# Patient Record
Sex: Female | Born: 1960
Health system: Southern US, Community
[De-identification: ages and names within clinical notes are randomized; demographics above are authoritative.]

## PROBLEM LIST (undated history)

## (undated) DIAGNOSIS — C50919 Malignant neoplasm of unspecified site of unspecified female breast: Secondary | ICD-10-CM

## (undated) DIAGNOSIS — L509 Urticaria, unspecified: Secondary | ICD-10-CM

## (undated) DIAGNOSIS — M899 Disorder of bone, unspecified: Secondary | ICD-10-CM

## (undated) DIAGNOSIS — Z853 Personal history of malignant neoplasm of breast: Secondary | ICD-10-CM

## (undated) DIAGNOSIS — M949 Disorder of cartilage, unspecified: Secondary | ICD-10-CM

## (undated) DIAGNOSIS — K219 Gastro-esophageal reflux disease without esophagitis: Secondary | ICD-10-CM

## (undated) DIAGNOSIS — E669 Obesity, unspecified: Secondary | ICD-10-CM

## (undated) DIAGNOSIS — I00 Rheumatic fever without heart involvement: Secondary | ICD-10-CM

## (undated) HISTORY — DX: Disorder of bone, unspecified: M89.9

## (undated) HISTORY — DX: Disorder of cartilage, unspecified: M94.9

## (undated) HISTORY — DX: Urticaria, unspecified: L50.9

## (undated) HISTORY — DX: Obesity, unspecified: E66.9

## (undated) HISTORY — DX: Malignant neoplasm of unspecified site of unspecified female breast: C50.919

## (undated) HISTORY — DX: Personal history of malignant neoplasm of breast: Z85.3

## (undated) HISTORY — DX: Rheumatic fever without heart involvement: I00

## (undated) HISTORY — DX: Gastro-esophageal reflux disease without esophagitis: K21.9

---

## 2003-08-22 ENCOUNTER — Encounter: Admission: RE | Admit: 2003-08-22 | Discharge: 2003-08-22 | Payer: Self-pay | Admitting: Family Medicine

## 2003-09-07 ENCOUNTER — Encounter: Admission: RE | Admit: 2003-09-07 | Discharge: 2003-09-07 | Payer: Self-pay | Admitting: Family Medicine

## 2003-09-14 ENCOUNTER — Ambulatory Visit (HOSPITAL_COMMUNITY): Admission: RE | Admit: 2003-09-14 | Discharge: 2003-09-14 | Payer: Self-pay | Admitting: Family Medicine

## 2003-12-07 ENCOUNTER — Other Ambulatory Visit: Admission: RE | Admit: 2003-12-07 | Discharge: 2003-12-07 | Payer: Self-pay | Admitting: Obstetrics and Gynecology

## 2003-12-18 DIAGNOSIS — D259 Leiomyoma of uterus, unspecified: Secondary | ICD-10-CM | POA: Insufficient documentation

## 2004-09-08 ENCOUNTER — Emergency Department (HOSPITAL_COMMUNITY): Admission: EM | Admit: 2004-09-08 | Discharge: 2004-09-08 | Payer: Self-pay | Admitting: Emergency Medicine

## 2004-09-11 ENCOUNTER — Encounter (INDEPENDENT_AMBULATORY_CARE_PROVIDER_SITE_OTHER): Payer: Self-pay | Admitting: *Deleted

## 2004-09-11 ENCOUNTER — Observation Stay (HOSPITAL_COMMUNITY): Admission: RE | Admit: 2004-09-11 | Discharge: 2004-09-12 | Payer: Self-pay | Admitting: General Surgery

## 2005-12-03 ENCOUNTER — Encounter: Admission: RE | Admit: 2005-12-03 | Discharge: 2005-12-03 | Payer: Self-pay | Admitting: Family Medicine

## 2006-01-02 ENCOUNTER — Other Ambulatory Visit: Admission: RE | Admit: 2006-01-02 | Discharge: 2006-01-02 | Payer: Self-pay | Admitting: Obstetrics and Gynecology

## 2006-01-09 ENCOUNTER — Ambulatory Visit (HOSPITAL_COMMUNITY): Admission: RE | Admit: 2006-01-09 | Discharge: 2006-01-09 | Payer: Self-pay | Admitting: Obstetrics and Gynecology

## 2006-01-23 ENCOUNTER — Encounter: Admission: RE | Admit: 2006-01-23 | Discharge: 2006-01-23 | Payer: Self-pay | Admitting: Obstetrics and Gynecology

## 2006-01-27 ENCOUNTER — Encounter: Admission: RE | Admit: 2006-01-27 | Discharge: 2006-01-27 | Payer: Self-pay | Admitting: Obstetrics and Gynecology

## 2006-01-27 ENCOUNTER — Encounter (INDEPENDENT_AMBULATORY_CARE_PROVIDER_SITE_OTHER): Payer: Self-pay | Admitting: *Deleted

## 2007-06-02 ENCOUNTER — Encounter: Admission: RE | Admit: 2007-06-02 | Discharge: 2007-06-02 | Payer: Self-pay | Admitting: Rheumatology

## 2007-07-08 ENCOUNTER — Ambulatory Visit: Payer: Self-pay | Admitting: Oncology

## 2007-08-08 ENCOUNTER — Emergency Department (HOSPITAL_COMMUNITY): Admission: EM | Admit: 2007-08-08 | Discharge: 2007-08-08 | Payer: Self-pay | Admitting: Family Medicine

## 2007-08-12 LAB — SEDIMENTATION RATE: Sed Rate: 31 mm/hr — ABNORMAL HIGH (ref 0–22)

## 2007-08-12 LAB — CBC & DIFF AND RETIC
BASO%: 0.5 % (ref 0.0–2.0)
Eosinophils Absolute: 0.1 10*3/uL (ref 0.0–0.5)
HCT: 28.8 % — ABNORMAL LOW (ref 34.8–46.6)
LYMPH%: 23.2 % (ref 14.0–48.0)
MCHC: 31.4 g/dL — ABNORMAL LOW (ref 32.0–36.0)
MCV: 65.1 fL — ABNORMAL LOW (ref 81.0–101.0)
MONO#: 0.6 10*3/uL (ref 0.1–0.9)
MONO%: 6.6 % (ref 0.0–13.0)
NEUT%: 68.7 % (ref 39.6–76.8)
Platelets: 386 10*3/uL (ref 145–400)
RBC: 4.43 10*6/uL (ref 3.70–5.32)
Retic %: 1.6 % (ref 0.4–2.3)
WBC: 8.6 10*3/uL (ref 3.9–10.0)

## 2007-08-12 LAB — COMPREHENSIVE METABOLIC PANEL
Albumin: 4.6 g/dL (ref 3.5–5.2)
BUN: 16 mg/dL (ref 6–23)
CO2: 22 mEq/L (ref 19–32)
Glucose, Bld: 100 mg/dL — ABNORMAL HIGH (ref 70–99)
Potassium: 4.1 mEq/L (ref 3.5–5.3)
Sodium: 137 mEq/L (ref 135–145)
Total Protein: 8.2 g/dL (ref 6.0–8.3)

## 2007-08-12 LAB — MORPHOLOGY

## 2007-08-12 LAB — LACTATE DEHYDROGENASE: LDH: 132 U/L (ref 94–250)

## 2007-08-14 LAB — FOLATE RBC: RBC Folate: 824 ng/mL — ABNORMAL HIGH (ref 180–600)

## 2007-08-14 LAB — FERRITIN: Ferritin: 3 ng/mL — ABNORMAL LOW (ref 10–291)

## 2007-08-14 LAB — HAPTOGLOBIN: Haptoglobin: 103 mg/dL (ref 16–200)

## 2007-08-14 LAB — IMMUNOFIXATION ELECTROPHORESIS: Total Protein, Serum Electrophoresis: 8.3 g/dL (ref 6.0–8.3)

## 2007-08-17 LAB — HEMOGLOBINOPATHY EVALUATION
Hgb A2 Quant: 2.3 % (ref 2.2–3.2)
Hgb F Quant: 0 % (ref 0.0–2.0)
Hgb S Quant: 0 % (ref 0.0–0.0)

## 2007-08-26 ENCOUNTER — Ambulatory Visit: Payer: Self-pay | Admitting: Oncology

## 2007-09-17 LAB — FERRITIN: Ferritin: 168 ng/mL (ref 10–291)

## 2007-09-17 LAB — CBC & DIFF AND RETIC
Basophils Absolute: 0 10*3/uL (ref 0.0–0.1)
Eosinophils Absolute: 0.1 10*3/uL (ref 0.0–0.5)
HCT: 34.8 % (ref 34.8–46.6)
HGB: 10.9 g/dL — ABNORMAL LOW (ref 11.6–15.9)
LYMPH%: 24.4 % (ref 14.0–48.0)
MCV: 70.4 fL — ABNORMAL LOW (ref 81.0–101.0)
MONO#: 0.6 10*3/uL (ref 0.1–0.9)
MONO%: 6.8 % (ref 0.0–13.0)
NEUT#: 5.5 10*3/uL (ref 1.5–6.5)
NEUT%: 66.7 % (ref 39.6–76.8)
Platelets: 358 10*3/uL (ref 145–400)
RBC: 4.94 10*6/uL (ref 3.70–5.32)
Retic %: 1.9 % (ref 0.4–2.3)
WBC: 8.2 10*3/uL (ref 3.9–10.0)

## 2007-10-01 LAB — CBC & DIFF AND RETIC
BASO%: 0.4 % (ref 0.0–2.0)
Eosinophils Absolute: 0.2 10*3/uL (ref 0.0–0.5)
MCHC: 33.3 g/dL (ref 32.0–36.0)
MONO#: 0.6 10*3/uL (ref 0.1–0.9)
NEUT#: 5.4 10*3/uL (ref 1.5–6.5)
RBC: 4.6 10*6/uL (ref 3.70–5.32)
RDW: 31.1 % — ABNORMAL HIGH (ref 11.3–14.5)
RETIC #: 69 10*3/uL (ref 19.7–115.1)
Retic %: 1.5 % (ref 0.4–2.3)
WBC: 7.9 10*3/uL (ref 3.9–10.0)
lymph#: 1.6 10*3/uL (ref 0.9–3.3)

## 2007-10-13 ENCOUNTER — Ambulatory Visit: Payer: Self-pay | Admitting: Oncology

## 2007-10-14 ENCOUNTER — Encounter: Admission: RE | Admit: 2007-10-14 | Discharge: 2007-10-14 | Payer: Self-pay | Admitting: Oncology

## 2007-10-15 LAB — CBC & DIFF AND RETIC
Basophils Absolute: 0 10*3/uL (ref 0.0–0.1)
EOS%: 1.4 % (ref 0.0–7.0)
Eosinophils Absolute: 0.1 10*3/uL (ref 0.0–0.5)
HGB: 11.6 g/dL (ref 11.6–15.9)
LYMPH%: 18.4 % (ref 14.0–48.0)
MCH: 25.4 pg — ABNORMAL LOW (ref 26.0–34.0)
MCV: 75.5 fL — ABNORMAL LOW (ref 81.0–101.0)
MONO%: 6.5 % (ref 0.0–13.0)
NEUT#: 5.7 10*3/uL (ref 1.5–6.5)
NEUT%: 73.7 % (ref 39.6–76.8)
Platelets: 303 10*3/uL (ref 145–400)
RDW: 30.5 % — ABNORMAL HIGH (ref 11.3–14.5)
RETIC #: 35.2 10*3/uL (ref 19.7–115.1)

## 2007-11-24 LAB — CBC & DIFF AND RETIC
Basophils Absolute: 0 10*3/uL (ref 0.0–0.1)
Eosinophils Absolute: 0.2 10*3/uL (ref 0.0–0.5)
HCT: 35.8 % (ref 34.8–46.6)
HGB: 12.2 g/dL (ref 11.6–15.9)
LYMPH%: 22.1 % (ref 14.0–48.0)
MCV: 79.1 fL — ABNORMAL LOW (ref 81.0–101.0)
MONO#: 0.5 10*3/uL (ref 0.1–0.9)
MONO%: 6.8 % (ref 0.0–13.0)
NEUT#: 4.9 10*3/uL (ref 1.5–6.5)
NEUT%: 68.5 % (ref 39.6–76.8)
Platelets: 296 10*3/uL (ref 145–400)
RBC: 4.52 10*6/uL (ref 3.70–5.32)

## 2007-12-11 ENCOUNTER — Ambulatory Visit: Payer: Self-pay | Admitting: Oncology

## 2007-12-15 ENCOUNTER — Encounter: Payer: Self-pay | Admitting: Internal Medicine

## 2007-12-15 LAB — CBC WITH DIFFERENTIAL/PLATELET
Basophils Absolute: 0.1 10*3/uL (ref 0.0–0.1)
Eosinophils Absolute: 0.1 10*3/uL (ref 0.0–0.5)
HGB: 12.6 g/dL (ref 11.6–15.9)
MCV: 82.6 fL (ref 81.0–101.0)
MONO#: 0.7 10*3/uL (ref 0.1–0.9)
MONO%: 7.7 % (ref 0.0–13.0)
NEUT#: 6.3 10*3/uL (ref 1.5–6.5)
RDW: 14.6 % — ABNORMAL HIGH (ref 11.3–14.5)
WBC: 9.3 10*3/uL (ref 3.9–10.0)

## 2007-12-22 ENCOUNTER — Emergency Department (HOSPITAL_COMMUNITY): Admission: EM | Admit: 2007-12-22 | Discharge: 2007-12-22 | Payer: Self-pay | Admitting: Emergency Medicine

## 2008-08-16 ENCOUNTER — Emergency Department (HOSPITAL_COMMUNITY): Admission: EM | Admit: 2008-08-16 | Discharge: 2008-08-16 | Payer: Self-pay | Admitting: Family Medicine

## 2008-11-04 ENCOUNTER — Encounter: Admission: RE | Admit: 2008-11-04 | Discharge: 2008-11-04 | Payer: Self-pay | Admitting: Obstetrics and Gynecology

## 2008-12-12 ENCOUNTER — Ambulatory Visit: Payer: Self-pay | Admitting: Oncology

## 2009-01-23 LAB — CBC & DIFF AND RETIC
Basophils Absolute: 0 10*3/uL (ref 0.0–0.1)
EOS%: 1.3 % (ref 0.0–7.0)
HCT: 30.3 % — ABNORMAL LOW (ref 34.8–46.6)
HGB: 10 g/dL — ABNORMAL LOW (ref 11.6–15.9)
IRF: 0.35 — ABNORMAL HIGH (ref 0.130–0.330)
MCH: 23.1 pg — ABNORMAL LOW (ref 25.1–34.0)
MCV: 70.3 fL — ABNORMAL LOW (ref 79.5–101.0)
MONO%: 5.5 % (ref 0.0–14.0)
NEUT%: 76.9 % — ABNORMAL HIGH (ref 38.4–76.8)
lymph#: 1.4 10*3/uL (ref 0.9–3.3)

## 2009-01-26 ENCOUNTER — Ambulatory Visit: Payer: Self-pay | Admitting: Oncology

## 2009-07-07 ENCOUNTER — Ambulatory Visit: Payer: Self-pay | Admitting: Oncology

## 2009-07-19 LAB — CBC & DIFF AND RETIC
BASO%: 0.4 % (ref 0.0–2.0)
LYMPH%: 26.3 % (ref 14.0–49.7)
MCHC: 32.6 g/dL (ref 31.5–36.0)
MCV: 88.6 fL (ref 79.5–101.0)
MONO%: 6.8 % (ref 0.0–14.0)
Platelets: 300 10*3/uL (ref 145–400)
RBC: 4.22 10*6/uL (ref 3.70–5.45)
nRBC: 0 % (ref 0–0)

## 2009-08-18 ENCOUNTER — Encounter: Admission: RE | Admit: 2009-08-18 | Discharge: 2009-08-18 | Payer: Self-pay | Admitting: Obstetrics and Gynecology

## 2009-08-22 ENCOUNTER — Encounter: Admission: RE | Admit: 2009-08-22 | Discharge: 2009-08-22 | Payer: Self-pay | Admitting: Obstetrics and Gynecology

## 2009-08-23 ENCOUNTER — Ambulatory Visit: Payer: Self-pay | Admitting: Oncology

## 2009-08-23 ENCOUNTER — Encounter: Admission: RE | Admit: 2009-08-23 | Discharge: 2009-08-23 | Payer: Self-pay | Admitting: Obstetrics and Gynecology

## 2009-08-28 ENCOUNTER — Ambulatory Visit (HOSPITAL_COMMUNITY): Admission: RE | Admit: 2009-08-28 | Discharge: 2009-08-28 | Payer: Self-pay | Admitting: Obstetrics and Gynecology

## 2009-09-02 LAB — CONVERTED CEMR LAB: Pap Smear: NORMAL

## 2009-09-12 LAB — CBC & DIFF AND RETIC
BASO%: 0.4 % (ref 0.0–2.0)
Basophils Absolute: 0 10*3/uL (ref 0.0–0.1)
EOS%: 1.7 % (ref 0.0–7.0)
HCT: 37 % (ref 34.8–46.6)
HGB: 11.9 g/dL (ref 11.6–15.9)
Immature Retic Fract: 4.9 % (ref 0.00–10.70)
MCH: 28.5 pg (ref 25.1–34.0)
MONO#: 0.5 10*3/uL (ref 0.1–0.9)
NEUT%: 67.9 % (ref 38.4–76.8)
RDW: 16 % — ABNORMAL HIGH (ref 11.2–14.5)
Retic Ct Abs: 57.27 10*3/uL (ref 18.30–72.70)
WBC: 7 10*3/uL (ref 3.9–10.3)
lymph#: 1.6 10*3/uL (ref 0.9–3.3)

## 2009-09-12 LAB — COMPREHENSIVE METABOLIC PANEL
ALT: 12 U/L (ref 0–35)
AST: 14 U/L (ref 0–37)
CO2: 26 mEq/L (ref 19–32)
Chloride: 105 mEq/L (ref 96–112)
Creatinine, Ser: 0.73 mg/dL (ref 0.40–1.20)
Sodium: 139 mEq/L (ref 135–145)
Total Bilirubin: 0.3 mg/dL (ref 0.3–1.2)
Total Protein: 7.3 g/dL (ref 6.0–8.3)

## 2009-09-18 ENCOUNTER — Ambulatory Visit (HOSPITAL_COMMUNITY): Admission: RE | Admit: 2009-09-18 | Discharge: 2009-09-18 | Payer: Self-pay | Admitting: Oncology

## 2009-09-20 ENCOUNTER — Ambulatory Visit (HOSPITAL_COMMUNITY): Admission: RE | Admit: 2009-09-20 | Discharge: 2009-09-20 | Payer: Self-pay | Admitting: General Surgery

## 2009-09-21 ENCOUNTER — Ambulatory Visit: Admission: RE | Admit: 2009-09-21 | Discharge: 2009-09-21 | Payer: Self-pay | Admitting: Oncology

## 2009-09-21 ENCOUNTER — Encounter: Payer: Self-pay | Admitting: Oncology

## 2009-09-25 ENCOUNTER — Ambulatory Visit: Payer: Self-pay | Admitting: Oncology

## 2009-10-02 ENCOUNTER — Ambulatory Visit: Payer: Self-pay | Admitting: Internal Medicine

## 2009-10-02 DIAGNOSIS — K219 Gastro-esophageal reflux disease without esophagitis: Secondary | ICD-10-CM

## 2009-10-02 DIAGNOSIS — I00 Rheumatic fever without heart involvement: Secondary | ICD-10-CM | POA: Insufficient documentation

## 2009-10-02 DIAGNOSIS — M949 Disorder of cartilage, unspecified: Secondary | ICD-10-CM

## 2009-10-02 DIAGNOSIS — M899 Disorder of bone, unspecified: Secondary | ICD-10-CM

## 2009-10-02 DIAGNOSIS — M1990 Unspecified osteoarthritis, unspecified site: Secondary | ICD-10-CM | POA: Insufficient documentation

## 2009-10-02 DIAGNOSIS — Z853 Personal history of malignant neoplasm of breast: Secondary | ICD-10-CM | POA: Insufficient documentation

## 2009-10-02 DIAGNOSIS — D649 Anemia, unspecified: Secondary | ICD-10-CM | POA: Insufficient documentation

## 2009-10-02 DIAGNOSIS — C50919 Malignant neoplasm of unspecified site of unspecified female breast: Secondary | ICD-10-CM

## 2009-10-02 HISTORY — DX: Personal history of malignant neoplasm of breast: Z85.3

## 2009-10-02 HISTORY — DX: Gastro-esophageal reflux disease without esophagitis: K21.9

## 2009-10-02 HISTORY — DX: Rheumatic fever without heart involvement: I00

## 2009-10-02 HISTORY — DX: Malignant neoplasm of unspecified site of unspecified female breast: C50.919

## 2009-10-02 HISTORY — DX: Disorder of bone, unspecified: M89.9

## 2009-10-02 LAB — CONVERTED CEMR LAB
Iron: 273 ug/dL — ABNORMAL HIGH (ref 42–145)
Transferrin: 224.6 mg/dL (ref 212.0–360.0)

## 2009-10-05 ENCOUNTER — Telehealth: Payer: Self-pay | Admitting: Internal Medicine

## 2009-10-05 ENCOUNTER — Encounter: Payer: Self-pay | Admitting: Internal Medicine

## 2009-10-05 LAB — CBC WITH DIFFERENTIAL/PLATELET
BASO%: 1.4 % (ref 0.0–2.0)
LYMPH%: 16.2 % (ref 14.0–49.7)
MCHC: 32.6 g/dL (ref 31.5–36.0)
MCV: 88.8 fL (ref 79.5–101.0)
MONO#: 0.4 10*3/uL (ref 0.1–0.9)
MONO%: 5.9 % (ref 0.0–14.0)
Platelets: 226 10*3/uL (ref 145–400)
RBC: 3.94 10*6/uL (ref 3.70–5.45)
WBC: 6.2 10*3/uL (ref 3.9–10.3)
nRBC: 0 % (ref 0–0)

## 2009-10-06 DIAGNOSIS — E162 Hypoglycemia, unspecified: Secondary | ICD-10-CM | POA: Insufficient documentation

## 2009-10-09 ENCOUNTER — Ambulatory Visit: Payer: Self-pay | Admitting: Internal Medicine

## 2009-10-09 ENCOUNTER — Encounter: Admission: RE | Admit: 2009-10-09 | Discharge: 2009-10-09 | Payer: Self-pay | Admitting: General Surgery

## 2009-10-12 ENCOUNTER — Encounter: Payer: Self-pay | Admitting: Internal Medicine

## 2009-10-12 LAB — CBC WITH DIFFERENTIAL/PLATELET
BASO%: 0.5 % (ref 0.0–2.0)
EOS%: 0.4 % (ref 0.0–7.0)
MCH: 29 pg (ref 25.1–34.0)
MCHC: 32.7 g/dL (ref 31.5–36.0)
RBC: 4.03 10*6/uL (ref 3.70–5.45)
RDW: 16.8 % — ABNORMAL HIGH (ref 11.2–14.5)
lymph#: 1.6 10*3/uL (ref 0.9–3.3)

## 2009-10-18 ENCOUNTER — Encounter: Payer: Self-pay | Admitting: Internal Medicine

## 2009-10-19 ENCOUNTER — Encounter: Payer: Self-pay | Admitting: Internal Medicine

## 2009-10-19 LAB — CBC WITH DIFFERENTIAL/PLATELET
Basophils Absolute: 0.1 10*3/uL (ref 0.0–0.1)
EOS%: 0.2 % (ref 0.0–7.0)
Eosinophils Absolute: 0 10*3/uL (ref 0.0–0.5)
HGB: 11.2 g/dL — ABNORMAL LOW (ref 11.6–15.9)
NEUT#: 10.9 10*3/uL — ABNORMAL HIGH (ref 1.5–6.5)
RDW: 17.2 % — ABNORMAL HIGH (ref 11.2–14.5)
lymph#: 1.3 10*3/uL (ref 0.9–3.3)
nRBC: 0 % (ref 0–0)

## 2009-10-24 ENCOUNTER — Ambulatory Visit: Payer: Self-pay | Admitting: Oncology

## 2009-10-26 ENCOUNTER — Encounter: Payer: Self-pay | Admitting: Internal Medicine

## 2009-10-26 LAB — CBC WITH DIFFERENTIAL/PLATELET
Eosinophils Absolute: 0.1 10*3/uL (ref 0.0–0.5)
HCT: 35.5 % (ref 34.8–46.6)
LYMPH%: 10.6 % — ABNORMAL LOW (ref 14.0–49.7)
MONO#: 1.8 10*3/uL — ABNORMAL HIGH (ref 0.1–0.9)
NEUT#: 14.9 10*3/uL — ABNORMAL HIGH (ref 1.5–6.5)
Platelets: 237 10*3/uL (ref 145–400)
RBC: 3.98 10*6/uL (ref 3.70–5.45)
WBC: 18.9 10*3/uL — ABNORMAL HIGH (ref 3.9–10.3)
lymph#: 2 10*3/uL (ref 0.9–3.3)
nRBC: 0 % (ref 0–0)

## 2009-11-01 ENCOUNTER — Encounter: Payer: Self-pay | Admitting: Internal Medicine

## 2009-11-01 LAB — CBC WITH DIFFERENTIAL/PLATELET
Basophils Absolute: 0 10*3/uL (ref 0.0–0.1)
Eosinophils Absolute: 0 10*3/uL (ref 0.0–0.5)
HCT: 33.3 % — ABNORMAL LOW (ref 34.8–46.6)
HGB: 11.1 g/dL — ABNORMAL LOW (ref 11.6–15.9)
MCH: 30 pg (ref 25.1–34.0)
MONO#: 0.3 10*3/uL (ref 0.1–0.9)
NEUT#: 20.1 10*3/uL — ABNORMAL HIGH (ref 1.5–6.5)
NEUT%: 96.2 % — ABNORMAL HIGH (ref 38.4–76.8)
RDW: 19.7 % — ABNORMAL HIGH (ref 11.2–14.5)
lymph#: 0.5 10*3/uL — ABNORMAL LOW (ref 0.9–3.3)

## 2009-11-01 LAB — URINALYSIS, MICROSCOPIC - CHCC
Bilirubin (Urine): NEGATIVE
Ketones: NEGATIVE mg/dL
Protein: 30 mg/dL
Specific Gravity, Urine: 1.03 (ref 1.003–1.035)
pH: 6 (ref 4.6–8.0)

## 2009-11-03 LAB — URINE CULTURE

## 2009-11-09 ENCOUNTER — Ambulatory Visit (HOSPITAL_COMMUNITY): Admission: RE | Admit: 2009-11-09 | Discharge: 2009-11-09 | Payer: Self-pay | Admitting: Oncology

## 2009-11-09 LAB — COMPREHENSIVE METABOLIC PANEL
ALT: 21 U/L (ref 0–35)
AST: 26 U/L (ref 0–37)
Albumin: 4.5 g/dL (ref 3.5–5.2)
Alkaline Phosphatase: 104 U/L (ref 39–117)
BUN: 10 mg/dL (ref 6–23)
CO2: 28 mEq/L (ref 19–32)
Calcium: 8.8 mg/dL (ref 8.4–10.5)
Glucose, Bld: 102 mg/dL — ABNORMAL HIGH (ref 70–99)
Potassium: 3.5 mEq/L (ref 3.5–5.3)
Sodium: 139 mEq/L (ref 135–145)

## 2009-11-09 LAB — URINALYSIS, MICROSCOPIC - CHCC
Ketones: NEGATIVE mg/dL
Nitrite: POSITIVE
Protein: NEGATIVE mg/dL

## 2009-11-09 LAB — CBC WITH DIFFERENTIAL/PLATELET
BASO%: 1.2 % (ref 0.0–2.0)
Basophils Absolute: 0.3 10*3/uL — ABNORMAL HIGH (ref 0.0–0.1)
EOS%: 0.8 % (ref 0.0–7.0)
Eosinophils Absolute: 0.2 10*3/uL (ref 0.0–0.5)
HCT: 31.6 % — ABNORMAL LOW (ref 34.8–46.6)
HGB: 10.3 g/dL — ABNORMAL LOW (ref 11.6–15.9)
LYMPH%: 7.7 % — ABNORMAL LOW (ref 14.0–49.7)
MCH: 29 pg (ref 25.1–34.0)
MCHC: 32.6 g/dL (ref 31.5–36.0)
MCV: 89 fL (ref 79.5–101.0)
MONO#: 2.5 10*3/uL — ABNORMAL HIGH (ref 0.1–0.9)
MONO%: 9.8 % (ref 0.0–14.0)
NEUT#: 20.6 10*3/uL — ABNORMAL HIGH (ref 1.5–6.5)
NEUT%: 80.5 % — ABNORMAL HIGH (ref 38.4–76.8)
Platelets: 262 10*3/uL (ref 145–400)
RBC: 3.55 10*6/uL — ABNORMAL LOW (ref 3.70–5.45)
RDW: 19.4 % — ABNORMAL HIGH (ref 11.2–14.5)
WBC: 25.7 10*3/uL — ABNORMAL HIGH (ref 3.9–10.3)
lymph#: 2 10*3/uL (ref 0.9–3.3)

## 2009-11-13 ENCOUNTER — Ambulatory Visit (HOSPITAL_COMMUNITY): Admission: RE | Admit: 2009-11-13 | Discharge: 2009-11-13 | Payer: Self-pay | Admitting: Oncology

## 2009-11-16 ENCOUNTER — Encounter: Payer: Self-pay | Admitting: Internal Medicine

## 2009-11-16 LAB — CBC WITH DIFFERENTIAL/PLATELET
BASO%: 1.6 % (ref 0.0–2.0)
HCT: 31 % — ABNORMAL LOW (ref 34.8–46.6)
LYMPH%: 19.6 % (ref 14.0–49.7)
MCH: 29.5 pg (ref 25.1–34.0)
MCHC: 33.5 g/dL (ref 31.5–36.0)
MCV: 88.1 fL (ref 79.5–101.0)
MONO#: 0.2 10*3/uL (ref 0.1–0.9)
MONO%: 4.5 % (ref 0.0–14.0)
NEUT%: 73.9 % (ref 38.4–76.8)
Platelets: 204 10*3/uL (ref 145–400)
WBC: 4.5 10*3/uL (ref 3.9–10.3)
lymph#: 0.9 10*3/uL (ref 0.9–3.3)

## 2009-11-27 ENCOUNTER — Ambulatory Visit: Payer: Self-pay | Admitting: Oncology

## 2009-11-28 ENCOUNTER — Encounter: Payer: Self-pay | Admitting: Internal Medicine

## 2009-11-28 LAB — CBC WITH DIFFERENTIAL/PLATELET
Basophils Absolute: 0 10*3/uL (ref 0.0–0.1)
HCT: 34.4 % — ABNORMAL LOW (ref 34.8–46.6)
MONO#: 1.5 10*3/uL — ABNORMAL HIGH (ref 0.1–0.9)
MONO%: 29 % — ABNORMAL HIGH (ref 0.0–14.0)
NEUT#: 2.4 10*3/uL (ref 1.5–6.5)
NEUT%: 46 % (ref 38.4–76.8)
RBC: 3.75 10*6/uL (ref 3.70–5.45)
nRBC: 0 % (ref 0–0)

## 2009-12-05 ENCOUNTER — Encounter: Payer: Self-pay | Admitting: Internal Medicine

## 2009-12-05 LAB — CBC WITH DIFFERENTIAL/PLATELET
BASO%: 0.6 % (ref 0.0–2.0)
EOS%: 2 % (ref 0.0–7.0)
HGB: 11.3 g/dL — ABNORMAL LOW (ref 11.6–15.9)
LYMPH%: 19.4 % (ref 14.0–49.7)
NEUT#: 4.6 10*3/uL (ref 1.5–6.5)
NEUT%: 70.9 % (ref 38.4–76.8)
RBC: 3.77 10*6/uL (ref 3.70–5.45)
WBC: 6.5 10*3/uL (ref 3.9–10.3)

## 2009-12-12 ENCOUNTER — Encounter: Payer: Self-pay | Admitting: Internal Medicine

## 2009-12-12 LAB — CBC WITH DIFFERENTIAL/PLATELET
BASO%: 0.5 % (ref 0.0–2.0)
Basophils Absolute: 0 10*3/uL (ref 0.0–0.1)
EOS%: 2.2 % (ref 0.0–7.0)
Eosinophils Absolute: 0.1 10*3/uL (ref 0.0–0.5)
HGB: 11.4 g/dL — ABNORMAL LOW (ref 11.6–15.9)
LYMPH%: 18 % (ref 14.0–49.7)
MCH: 30.4 pg (ref 25.1–34.0)
MCV: 91.2 fL (ref 79.5–101.0)
MONO#: 0.6 10*3/uL (ref 0.1–0.9)
NEUT#: 4.5 10*3/uL (ref 1.5–6.5)
NEUT%: 69.8 % (ref 38.4–76.8)
lymph#: 1.2 10*3/uL (ref 0.9–3.3)

## 2009-12-13 LAB — URINALYSIS, MICROSCOPIC - CHCC
Glucose: NEGATIVE g/dL
Ketones: NEGATIVE mg/dL
Nitrite: NEGATIVE
Specific Gravity, Urine: 1.005 (ref 1.003–1.035)

## 2009-12-19 ENCOUNTER — Encounter: Payer: Self-pay | Admitting: Internal Medicine

## 2009-12-19 LAB — CBC WITH DIFFERENTIAL/PLATELET
Basophils Absolute: 0 10*3/uL (ref 0.0–0.1)
Eosinophils Absolute: 0.2 10*3/uL (ref 0.0–0.5)
HCT: 32.9 % — ABNORMAL LOW (ref 34.8–46.6)
LYMPH%: 20.9 % (ref 14.0–49.7)
MCH: 30.1 pg (ref 25.1–34.0)
MCV: 91.6 fL (ref 79.5–101.0)
MONO#: 0.5 10*3/uL (ref 0.1–0.9)
MONO%: 8.5 % (ref 0.0–14.0)
NEUT#: 4.1 10*3/uL (ref 1.5–6.5)
Platelets: 230 10*3/uL (ref 145–400)
RBC: 3.59 10*6/uL — ABNORMAL LOW (ref 3.70–5.45)
lymph#: 1.3 10*3/uL (ref 0.9–3.3)

## 2009-12-19 LAB — COMPREHENSIVE METABOLIC PANEL
ALT: 31 U/L (ref 0–35)
AST: 20 U/L (ref 0–37)
Calcium: 9.3 mg/dL (ref 8.4–10.5)
Chloride: 103 mEq/L (ref 96–112)
Sodium: 138 mEq/L (ref 135–145)
Total Protein: 6.9 g/dL (ref 6.0–8.3)

## 2009-12-19 LAB — URINALYSIS, MICROSCOPIC - CHCC
Bilirubin (Urine): NEGATIVE
Glucose: NEGATIVE g/dL
Nitrite: NEGATIVE
Specific Gravity, Urine: 1.005 (ref 1.003–1.035)

## 2009-12-21 LAB — URINE CULTURE

## 2009-12-26 ENCOUNTER — Encounter: Payer: Self-pay | Admitting: Internal Medicine

## 2009-12-26 LAB — CBC WITH DIFFERENTIAL/PLATELET
BASO%: 0.7 % (ref 0.0–2.0)
Basophils Absolute: 0 10*3/uL (ref 0.0–0.1)
EOS%: 3.4 % (ref 0.0–7.0)
HCT: 35 % (ref 34.8–46.6)
MCH: 30.5 pg (ref 25.1–34.0)
MCV: 94.6 fL (ref 79.5–101.0)
NEUT#: 3.8 10*3/uL (ref 1.5–6.5)
Platelets: 295 10*3/uL (ref 145–400)
WBC: 5.9 10*3/uL (ref 3.9–10.3)
lymph#: 1.3 10*3/uL (ref 0.9–3.3)
nRBC: 0 % (ref 0–0)

## 2009-12-29 ENCOUNTER — Ambulatory Visit: Payer: Self-pay | Admitting: Oncology

## 2010-01-02 ENCOUNTER — Encounter: Payer: Self-pay | Admitting: Internal Medicine

## 2010-01-02 LAB — CBC WITH DIFFERENTIAL/PLATELET
Basophils Absolute: 0 10*3/uL (ref 0.0–0.1)
EOS%: 2.3 % (ref 0.0–7.0)
HCT: 32.5 % — ABNORMAL LOW (ref 34.8–46.6)
MCH: 31.1 pg (ref 25.1–34.0)
MONO#: 0.4 10*3/uL (ref 0.1–0.9)
MONO%: 7.7 % (ref 0.0–14.0)
NEUT%: 65.1 % (ref 38.4–76.8)
Platelets: 284 10*3/uL (ref 145–400)
RBC: 3.47 10*6/uL — ABNORMAL LOW (ref 3.70–5.45)
RDW: 16.2 % — ABNORMAL HIGH (ref 11.2–14.5)
WBC: 4.8 10*3/uL (ref 3.9–10.3)
lymph#: 1.2 10*3/uL (ref 0.9–3.3)

## 2010-01-09 ENCOUNTER — Encounter: Payer: Self-pay | Admitting: Internal Medicine

## 2010-01-09 LAB — CBC WITH DIFFERENTIAL/PLATELET
HCT: 33.3 % — ABNORMAL LOW (ref 34.8–46.6)
HGB: 11.1 g/dL — ABNORMAL LOW (ref 11.6–15.9)
LYMPH%: 21.3 % (ref 14.0–49.7)
MCHC: 33.3 g/dL (ref 31.5–36.0)
MONO#: 0.5 10*3/uL (ref 0.1–0.9)
NEUT#: 3.9 10*3/uL (ref 1.5–6.5)
NEUT%: 68.3 % (ref 38.4–76.8)
Platelets: 297 10*3/uL (ref 145–400)
lymph#: 1.2 10*3/uL (ref 0.9–3.3)

## 2010-01-16 ENCOUNTER — Encounter: Payer: Self-pay | Admitting: Internal Medicine

## 2010-01-16 LAB — CBC WITH DIFFERENTIAL/PLATELET
BASO%: 0.4 % (ref 0.0–2.0)
Basophils Absolute: 0 10*3/uL (ref 0.0–0.1)
EOS%: 1.1 % (ref 0.0–7.0)
Eosinophils Absolute: 0.1 10*3/uL (ref 0.0–0.5)
HCT: 34.6 % — ABNORMAL LOW (ref 34.8–46.6)
LYMPH%: 18.6 % (ref 14.0–49.7)
MCH: 31.6 pg (ref 25.1–34.0)
MCHC: 32.9 g/dL (ref 31.5–36.0)
MCV: 95.8 fL (ref 79.5–101.0)
MONO#: 0.5 10*3/uL (ref 0.1–0.9)
NEUT%: 69.7 % (ref 38.4–76.8)
RBC: 3.61 10*6/uL — ABNORMAL LOW (ref 3.70–5.45)
WBC: 5.2 10*3/uL (ref 3.9–10.3)

## 2010-01-23 ENCOUNTER — Encounter: Payer: Self-pay | Admitting: Internal Medicine

## 2010-01-23 LAB — CBC WITH DIFFERENTIAL/PLATELET
BASO%: 0.7 % (ref 0.0–2.0)
Basophils Absolute: 0 10*3/uL (ref 0.0–0.1)
Eosinophils Absolute: 0.1 10*3/uL (ref 0.0–0.5)
HCT: 33.6 % — ABNORMAL LOW (ref 34.8–46.6)
LYMPH%: 20.6 % (ref 14.0–49.7)
MCHC: 33 g/dL (ref 31.5–36.0)
NEUT%: 69.3 % (ref 38.4–76.8)
WBC: 5.7 10*3/uL (ref 3.9–10.3)
nRBC: 0 % (ref 0–0)

## 2010-01-29 ENCOUNTER — Ambulatory Visit: Payer: Self-pay | Admitting: Oncology

## 2010-01-30 ENCOUNTER — Encounter: Payer: Self-pay | Admitting: Internal Medicine

## 2010-01-30 LAB — CBC WITH DIFFERENTIAL/PLATELET
EOS%: 1.2 % (ref 0.0–7.0)
LYMPH%: 16.1 % (ref 14.0–49.7)
MCH: 32.2 pg (ref 25.1–34.0)
MCHC: 33.1 g/dL (ref 31.5–36.0)
MONO#: 0.5 10*3/uL (ref 0.1–0.9)
NEUT%: 73.7 % (ref 38.4–76.8)
Platelets: 312 10*3/uL (ref 145–400)
RBC: 3.39 10*6/uL — ABNORMAL LOW (ref 3.70–5.45)
WBC: 6.1 10*3/uL (ref 3.9–10.3)

## 2010-02-02 ENCOUNTER — Ambulatory Visit (HOSPITAL_COMMUNITY): Admission: RE | Admit: 2010-02-02 | Discharge: 2010-02-02 | Payer: Self-pay | Admitting: Oncology

## 2010-02-06 ENCOUNTER — Encounter: Payer: Self-pay | Admitting: Internal Medicine

## 2010-02-06 LAB — CBC WITH DIFFERENTIAL/PLATELET
BASO%: 0.8 % (ref 0.0–2.0)
Basophils Absolute: 0.1 10*3/uL (ref 0.0–0.1)
EOS%: 1.1 % (ref 0.0–7.0)
HCT: 35.4 % (ref 34.8–46.6)
LYMPH%: 18.6 % (ref 14.0–49.7)
MONO#: 0.5 10*3/uL (ref 0.1–0.9)
MONO%: 8.1 % (ref 0.0–14.0)
NEUT#: 4.7 10*3/uL (ref 1.5–6.5)
RBC: 3.7 10*6/uL (ref 3.70–5.45)
WBC: 6.5 10*3/uL (ref 3.9–10.3)
nRBC: 0 % (ref 0–0)

## 2010-02-06 LAB — COMPREHENSIVE METABOLIC PANEL
ALT: 28 U/L (ref 0–35)
AST: 26 U/L (ref 0–37)
CO2: 29 mEq/L (ref 19–32)
Chloride: 106 mEq/L (ref 96–112)
Creatinine, Ser: 0.68 mg/dL (ref 0.40–1.20)
Potassium: 3.9 mEq/L (ref 3.5–5.3)
Total Bilirubin: 0.5 mg/dL (ref 0.3–1.2)
Total Protein: 7 g/dL (ref 6.0–8.3)

## 2010-02-13 ENCOUNTER — Encounter: Payer: Self-pay | Admitting: Internal Medicine

## 2010-02-13 LAB — CBC WITH DIFFERENTIAL/PLATELET
BASO%: 0.2 % (ref 0.0–2.0)
EOS%: 1.5 % (ref 0.0–7.0)
Eosinophils Absolute: 0.1 10*3/uL (ref 0.0–0.5)
MCH: 33.2 pg (ref 25.1–34.0)
MCV: 97.8 fL (ref 79.5–101.0)

## 2010-02-15 ENCOUNTER — Ambulatory Visit (HOSPITAL_COMMUNITY): Admission: RE | Admit: 2010-02-15 | Discharge: 2010-02-15 | Payer: Self-pay | Admitting: Oncology

## 2010-02-19 ENCOUNTER — Encounter: Payer: Self-pay | Admitting: Internal Medicine

## 2010-02-19 LAB — CBC WITH DIFFERENTIAL/PLATELET
BASO%: 0.8 % (ref 0.0–2.0)
EOS%: 1.2 % (ref 0.0–7.0)
Eosinophils Absolute: 0.1 10*3/uL (ref 0.0–0.5)
MCH: 31.8 pg (ref 25.1–34.0)
MCHC: 32.9 g/dL (ref 31.5–36.0)
MCV: 96.7 fL (ref 79.5–101.0)
MONO%: 8.4 % (ref 0.0–14.0)
NEUT%: 66 % (ref 38.4–76.8)
RBC: 3.62 10*6/uL — ABNORMAL LOW (ref 3.70–5.45)
RDW: 16 % — ABNORMAL HIGH (ref 11.2–14.5)
lymph#: 1.2 10*3/uL (ref 0.9–3.3)

## 2010-03-02 ENCOUNTER — Encounter: Payer: Self-pay | Admitting: Internal Medicine

## 2010-03-06 ENCOUNTER — Ambulatory Visit: Admission: RE | Admit: 2010-03-06 | Discharge: 2010-06-04 | Payer: Self-pay | Admitting: Radiation Oncology

## 2010-03-08 ENCOUNTER — Encounter: Payer: Self-pay | Admitting: Internal Medicine

## 2010-03-12 ENCOUNTER — Encounter (INDEPENDENT_AMBULATORY_CARE_PROVIDER_SITE_OTHER): Payer: Self-pay | Admitting: Surgery

## 2010-03-12 ENCOUNTER — Ambulatory Visit (HOSPITAL_COMMUNITY): Admission: RE | Admit: 2010-03-12 | Discharge: 2010-03-13 | Payer: Self-pay | Admitting: General Surgery

## 2010-03-12 HISTORY — PX: MASTECTOMY PARTIAL / LUMPECTOMY W/ AXILLARY LYMPHADENECTOMY: SUR852

## 2010-03-20 ENCOUNTER — Encounter (INDEPENDENT_AMBULATORY_CARE_PROVIDER_SITE_OTHER): Payer: Self-pay | Admitting: Surgery

## 2010-03-20 ENCOUNTER — Ambulatory Visit (HOSPITAL_COMMUNITY): Admission: RE | Admit: 2010-03-20 | Discharge: 2010-03-21 | Payer: Self-pay | Admitting: Neurological Surgery

## 2010-03-20 HISTORY — PX: BREAST LUMPECTOMY: SHX2

## 2010-03-24 ENCOUNTER — Ambulatory Visit (HOSPITAL_COMMUNITY): Admission: RE | Admit: 2010-03-24 | Discharge: 2010-03-25 | Payer: Self-pay | Admitting: Surgery

## 2010-03-26 ENCOUNTER — Encounter (INDEPENDENT_AMBULATORY_CARE_PROVIDER_SITE_OTHER): Payer: Self-pay | Admitting: Surgery

## 2010-03-26 HISTORY — PX: BREAST LUMPECTOMY: SHX2

## 2010-03-30 ENCOUNTER — Encounter (INDEPENDENT_AMBULATORY_CARE_PROVIDER_SITE_OTHER): Payer: Self-pay | Admitting: Surgery

## 2010-03-30 ENCOUNTER — Ambulatory Visit (HOSPITAL_COMMUNITY): Admission: RE | Admit: 2010-03-30 | Discharge: 2010-04-01 | Payer: Self-pay | Admitting: Surgery

## 2010-04-18 ENCOUNTER — Ambulatory Visit: Payer: Self-pay | Admitting: Oncology

## 2010-04-20 ENCOUNTER — Encounter: Payer: Self-pay | Admitting: Internal Medicine

## 2010-04-25 LAB — CBC WITH DIFFERENTIAL/PLATELET
BASO%: 0.1 % (ref 0.0–2.0)
Basophils Absolute: 0 10*3/uL (ref 0.0–0.1)
HCT: 36.9 % (ref 34.8–46.6)
HGB: 12.4 g/dL (ref 11.6–15.9)
MCHC: 33.7 g/dL (ref 31.5–36.0)
MCV: 92.4 fL (ref 79.5–101.0)
MONO#: 0.4 10*3/uL (ref 0.1–0.9)
NEUT#: 7.6 10*3/uL — ABNORMAL HIGH (ref 1.5–6.5)
NEUT%: 86.4 % — ABNORMAL HIGH (ref 38.4–76.8)
Platelets: 269 10*3/uL (ref 145–400)
RDW: 13.5 % (ref 11.2–14.5)
WBC: 8.8 10*3/uL (ref 3.9–10.3)
lymph#: 0.8 10*3/uL — ABNORMAL LOW (ref 0.9–3.3)

## 2010-04-26 ENCOUNTER — Encounter
Admission: RE | Admit: 2010-04-26 | Discharge: 2010-06-27 | Payer: Self-pay | Source: Home / Self Care | Admitting: Surgery

## 2010-04-26 LAB — VITAMIN D 25 HYDROXY (VIT D DEFICIENCY, FRACTURES): Vit D, 25-Hydroxy: 42 ng/mL (ref 30–89)

## 2010-04-26 LAB — COMPREHENSIVE METABOLIC PANEL
ALT: 24 U/L (ref 0–35)
AST: 23 U/L (ref 0–37)
CO2: 26 mEq/L (ref 19–32)
Calcium: 9.6 mg/dL (ref 8.4–10.5)
Chloride: 101 mEq/L (ref 96–112)
Total Protein: 7.6 g/dL (ref 6.0–8.3)

## 2010-04-26 LAB — FERRITIN: Ferritin: 46 ng/mL (ref 10–291)

## 2010-04-26 LAB — CANCER ANTIGEN 27.29: CA 27.29: 19 U/mL (ref 0–39)

## 2010-05-03 ENCOUNTER — Encounter: Payer: Self-pay | Admitting: Internal Medicine

## 2010-06-01 ENCOUNTER — Encounter: Payer: Self-pay | Admitting: Radiation Oncology

## 2010-06-01 ENCOUNTER — Ambulatory Visit: Payer: Self-pay | Admitting: Vascular Surgery

## 2010-06-01 ENCOUNTER — Ambulatory Visit: Admission: RE | Admit: 2010-06-01 | Discharge: 2010-06-01 | Payer: Self-pay | Admitting: Radiation Oncology

## 2010-06-04 ENCOUNTER — Ambulatory Visit: Admission: RE | Admit: 2010-06-04 | Discharge: 2010-06-15 | Payer: Self-pay | Admitting: Radiation Oncology

## 2010-06-23 ENCOUNTER — Encounter: Payer: Self-pay | Admitting: Internal Medicine

## 2010-07-06 ENCOUNTER — Ambulatory Visit: Payer: Self-pay | Admitting: Oncology

## 2010-07-10 LAB — CBC WITH DIFFERENTIAL/PLATELET
Basophils Absolute: 0 10*3/uL (ref 0.0–0.1)
Eosinophils Absolute: 0.1 10*3/uL (ref 0.0–0.5)
HGB: 12.5 g/dL (ref 11.6–15.9)
MCV: 89.1 fL (ref 79.5–101.0)
MONO#: 0.4 10*3/uL (ref 0.1–0.9)
NEUT#: 3.5 10*3/uL (ref 1.5–6.5)
RDW: 14.2 % (ref 11.2–14.5)
WBC: 4.6 10*3/uL (ref 3.9–10.3)
lymph#: 0.6 10*3/uL — ABNORMAL LOW (ref 0.9–3.3)

## 2010-07-10 LAB — COMPREHENSIVE METABOLIC PANEL
Albumin: 4.5 g/dL (ref 3.5–5.2)
BUN: 12 mg/dL (ref 6–23)
Calcium: 8.9 mg/dL (ref 8.4–10.5)
Chloride: 104 mEq/L (ref 96–112)
Glucose, Bld: 149 mg/dL — ABNORMAL HIGH (ref 70–99)
Potassium: 3.8 mEq/L (ref 3.5–5.3)
Sodium: 141 mEq/L (ref 135–145)
Total Protein: 6.9 g/dL (ref 6.0–8.3)

## 2010-07-10 LAB — FOLLICLE STIMULATING HORMONE: FSH: 40.7 m[IU]/mL

## 2010-07-14 LAB — ESTRADIOL, ULTRA SENS: Estradiol, Ultra Sensitive: 14 pg/mL

## 2010-07-17 ENCOUNTER — Encounter: Payer: Self-pay | Admitting: Internal Medicine

## 2010-07-20 ENCOUNTER — Encounter: Payer: Self-pay | Admitting: Internal Medicine

## 2010-08-03 IMAGING — CT CT HEAD WO/W CM
3 of 5 series · 15 of 47 positions shown, 18 images · IV contrast (agent unspecified)
Comparison: None.

CT HEAD WITHOUT AND WITH CONTRAST

Addendum Begins

A comparison CT of the abdomen dated 12/03/2005 has now been
identified with regard to the 2 liver lesions described on this
report.
The smaller more cephalad lesion in the left lobe of the liver is
unchanged and benign.
The posterior right lobe lesion has increased from 7 mm in 8661 to
13 mm currently.  Still, given the long time course and absence of
hypermetabolism on PET, this is most compatible with a benign cyst
or biliary hamartoma.
I discussed the above with Dr. Benoit by telephone at 8223 hours
on 09/26/2009.
Addendum Ends
CLINICAL DATA: 49-year-old female with breast cancer.
Contrast:  100 ml 3mnipaque-5LL.
TECHNIQUE: Contiguous axial images were obtained from the base of
the skull through the vertex without and with intravenous contrast
TECHNIQUE: Multidetector CT imaging of the chest was performed Hypolito
Jumper intravenous contrast   administration.

[Series 5: chest with st · axial · 0.68mm/px · z∈[+923,+1183]mm · 9 of 62 slices shown, 12 images]
[im 5/62  brain]
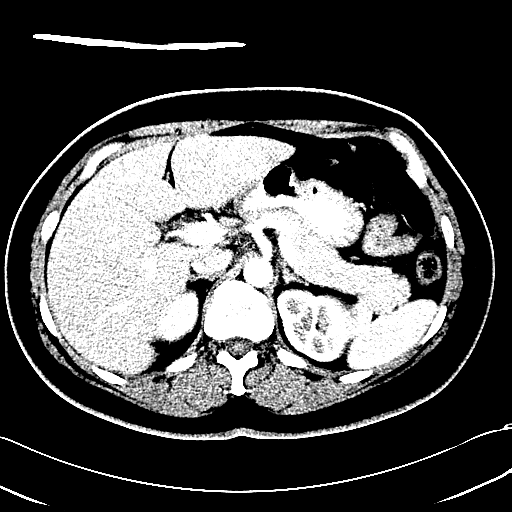
[im 5/62  bone]
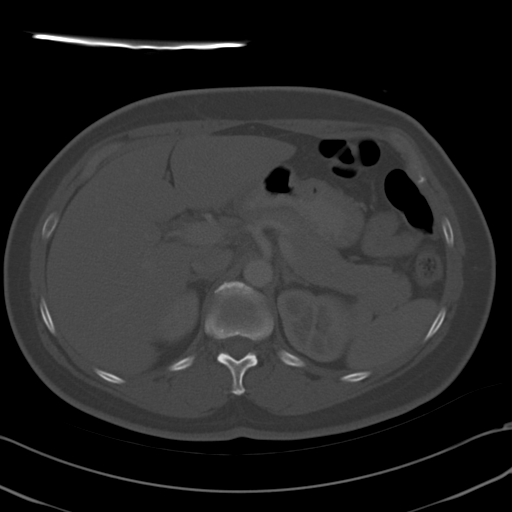
[im 15/62  brain]
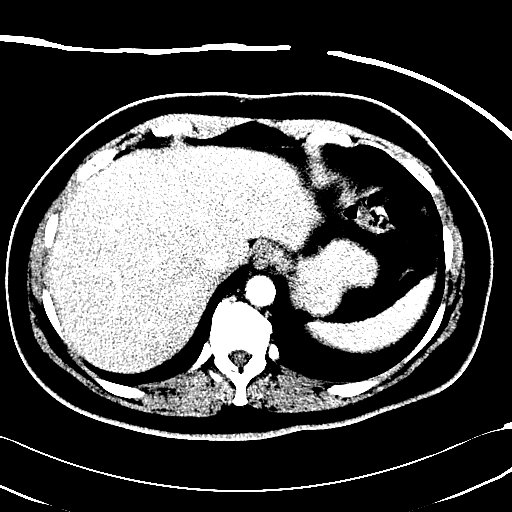
[im 19/62  brain]
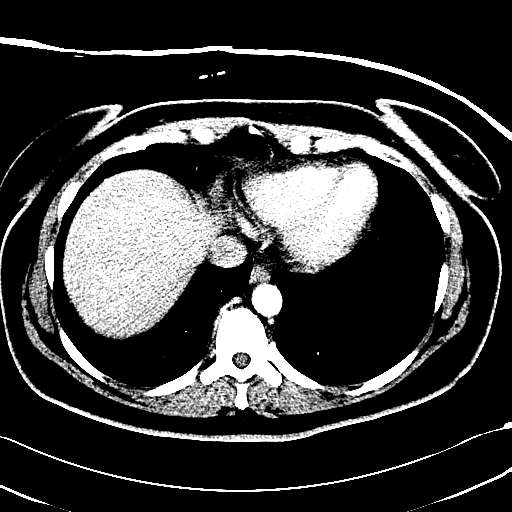
[im 24/62  brain]
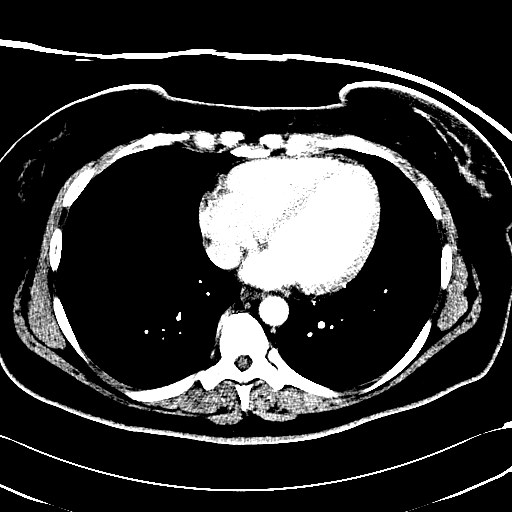
[im 33/62  brain]
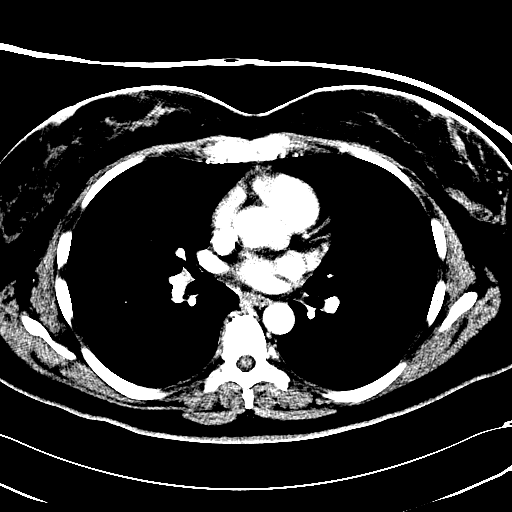
[im 33/62  bone]
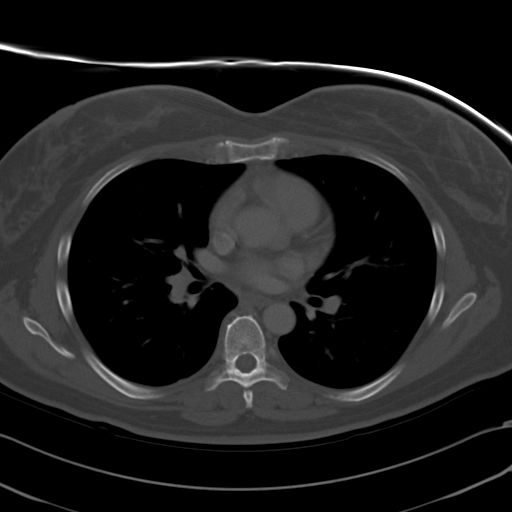
[im 38/62  brain]
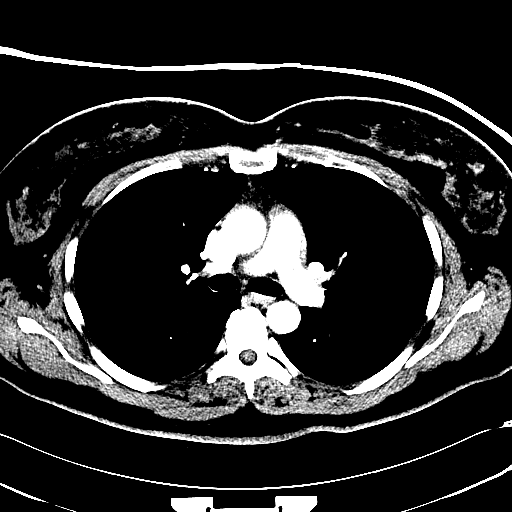
[im 43/62  brain]
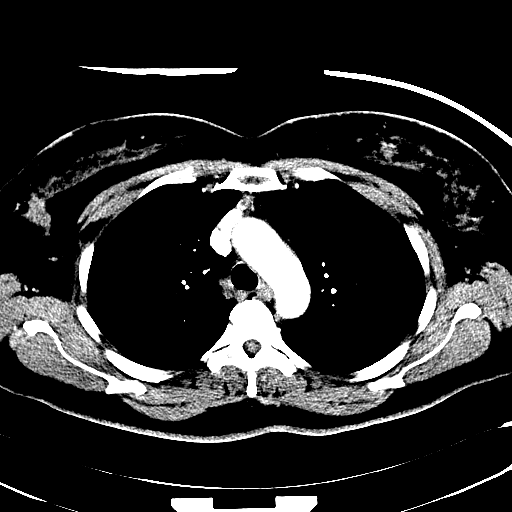
[im 52/62  brain]
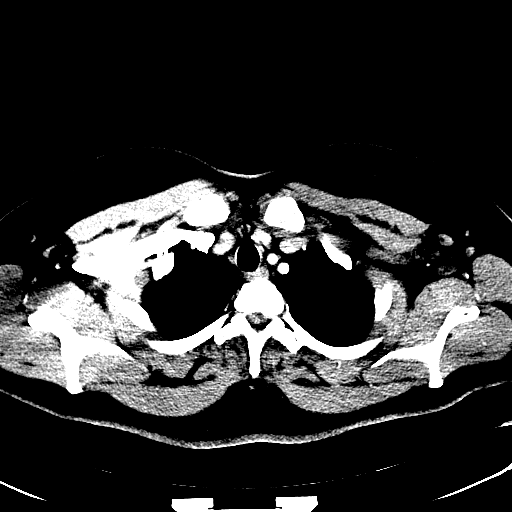
[im 57/62  brain]
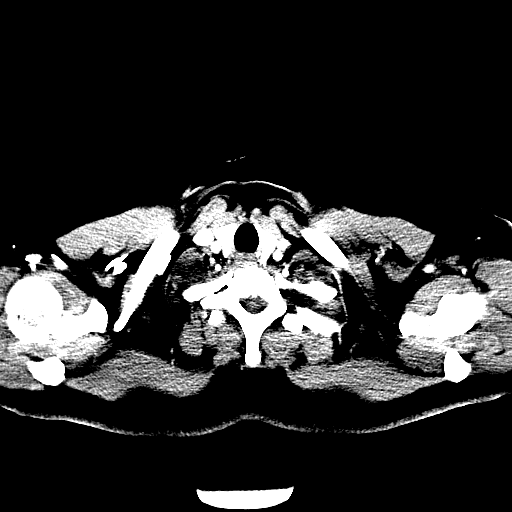
[im 57/62  bone]
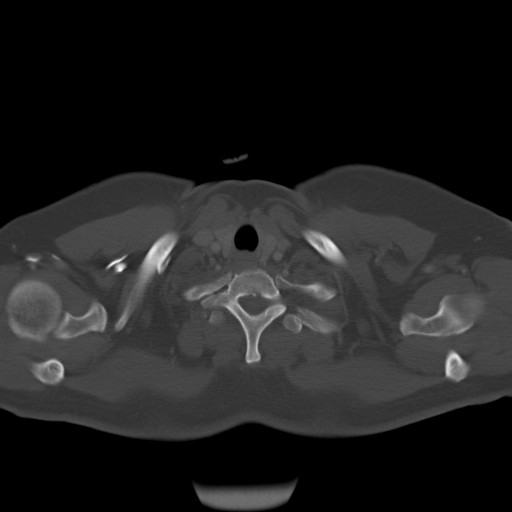

[Series 602: <mpr thick range> · coronal · 0.68mm/px · 3 of 72 slices shown]
[im 24/72  brain]
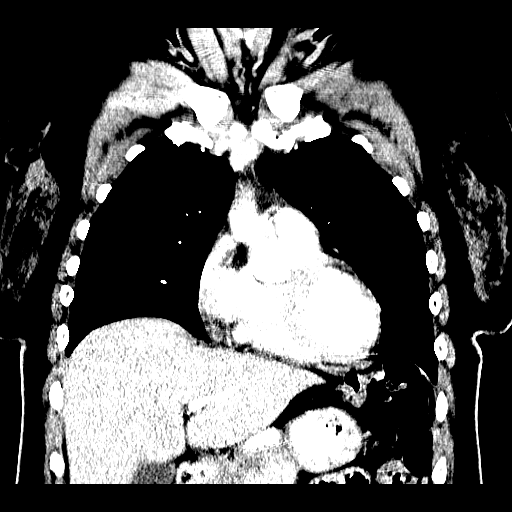
[im 32/72  brain]
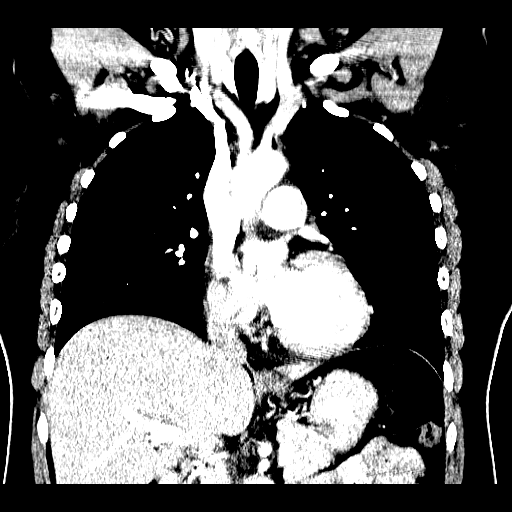
[im 40/72  brain]
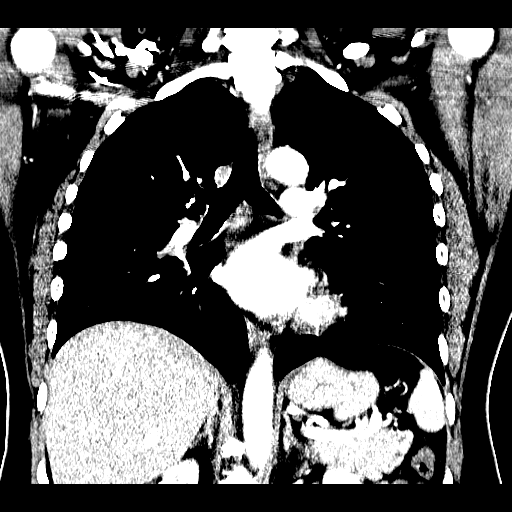

[Series 603: <mpr thick range(1)> · sagittal · 0.68mm/px · 3 of 101 slices shown]
[im 34/101  brain]
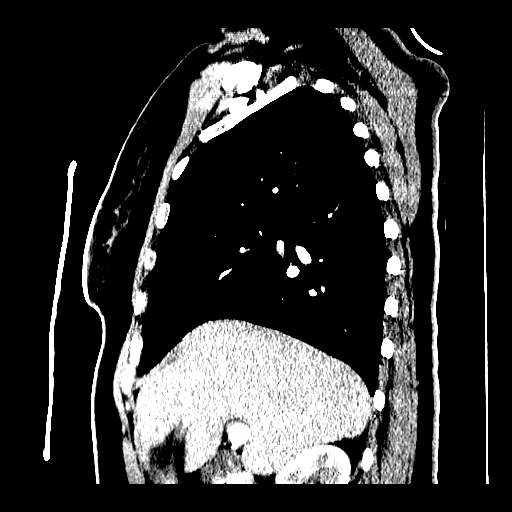
[im 51/101  brain]
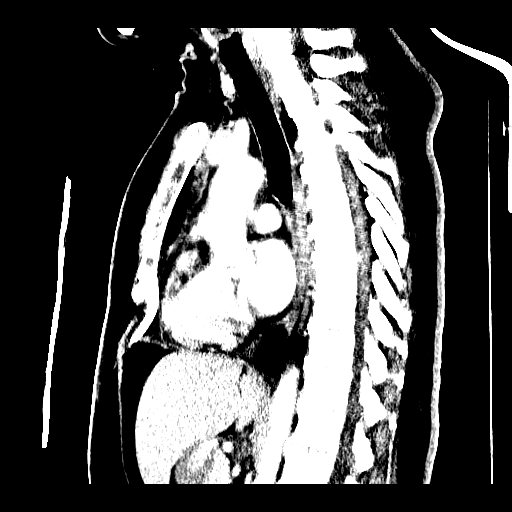
[im 67/101  brain]
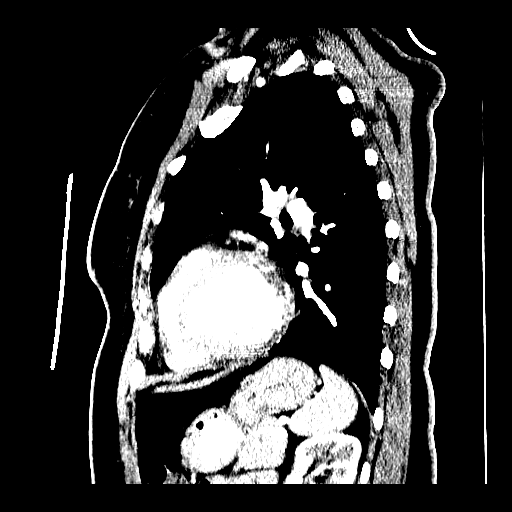

[15 of 47 positions shown; findings below may reference images not displayed]

FINDINGS: Visualized orbits and scalp soft tissues are within
normal limits.  Visualized paranasal sinuses and mastoids are
clear.  No acute osseous abnormality identified.  Cerebral volume
is within normal limits for age.  No midline shift,
ventriculomegaly, mass effect, evidence of mass lesion,
intracranial hemorrhage or evidence of cortically based acute
infarction.  Gray-white matter differentiation is within normal
limits throughout the brain.  No abnormal enhancement identified.
IMPRESSION: Normal CT appearance of the brain.

CT CHEST WITH CONTRAST
FINDINGS: Major airways are patent.  Occasional 2 mm subpleural
foci in the lungs bilaterally most compatible with scarring (for
example series 7 image 11 in the right apex).  There is a tiny
calcified granuloma in the lateral segment of the right middle
lobe.  Lungs are otherwise clear.  Thoracic inlet is within normal
limits.  No pericardial or pleural effusion.  Probable
postoperative surgical clip in the right breast soft tissues on
series 5 image 21.  No axillary lymphadenopathy.  No mediastinal or
internal mammary lymphadenopathy. No acute or suspicious osseous
abnormality.

Visualized upper abdominal viscera are remarkable for to low
density areas in the liver.  The first in the left lobe on image 51
is fairly circumscribed and measures 4 mm.  The second is seen in
the posterior right lobe at the inferior most aspect of the scan is
seen on image 62 and measures at least 9 mm.
IMPRESSION: 1.  No metastatic disease identified in the chest.
2.  Two low density area in the liver are identified, the larger
lesion in the right lobe is incompletely included on these images.
These are not hypermetabolic on the PET-CT from today, favoring
benign etiology.

## 2010-08-05 IMAGING — CR DG CHEST 1V PORT
1 series · 1 of 1 positions shown · non-contrast
Comparison: None.

CLINICAL DATA: Postop for Port-A-Cath placement.

PORTABLE CHEST - 1 VIEW

[view not recorded]
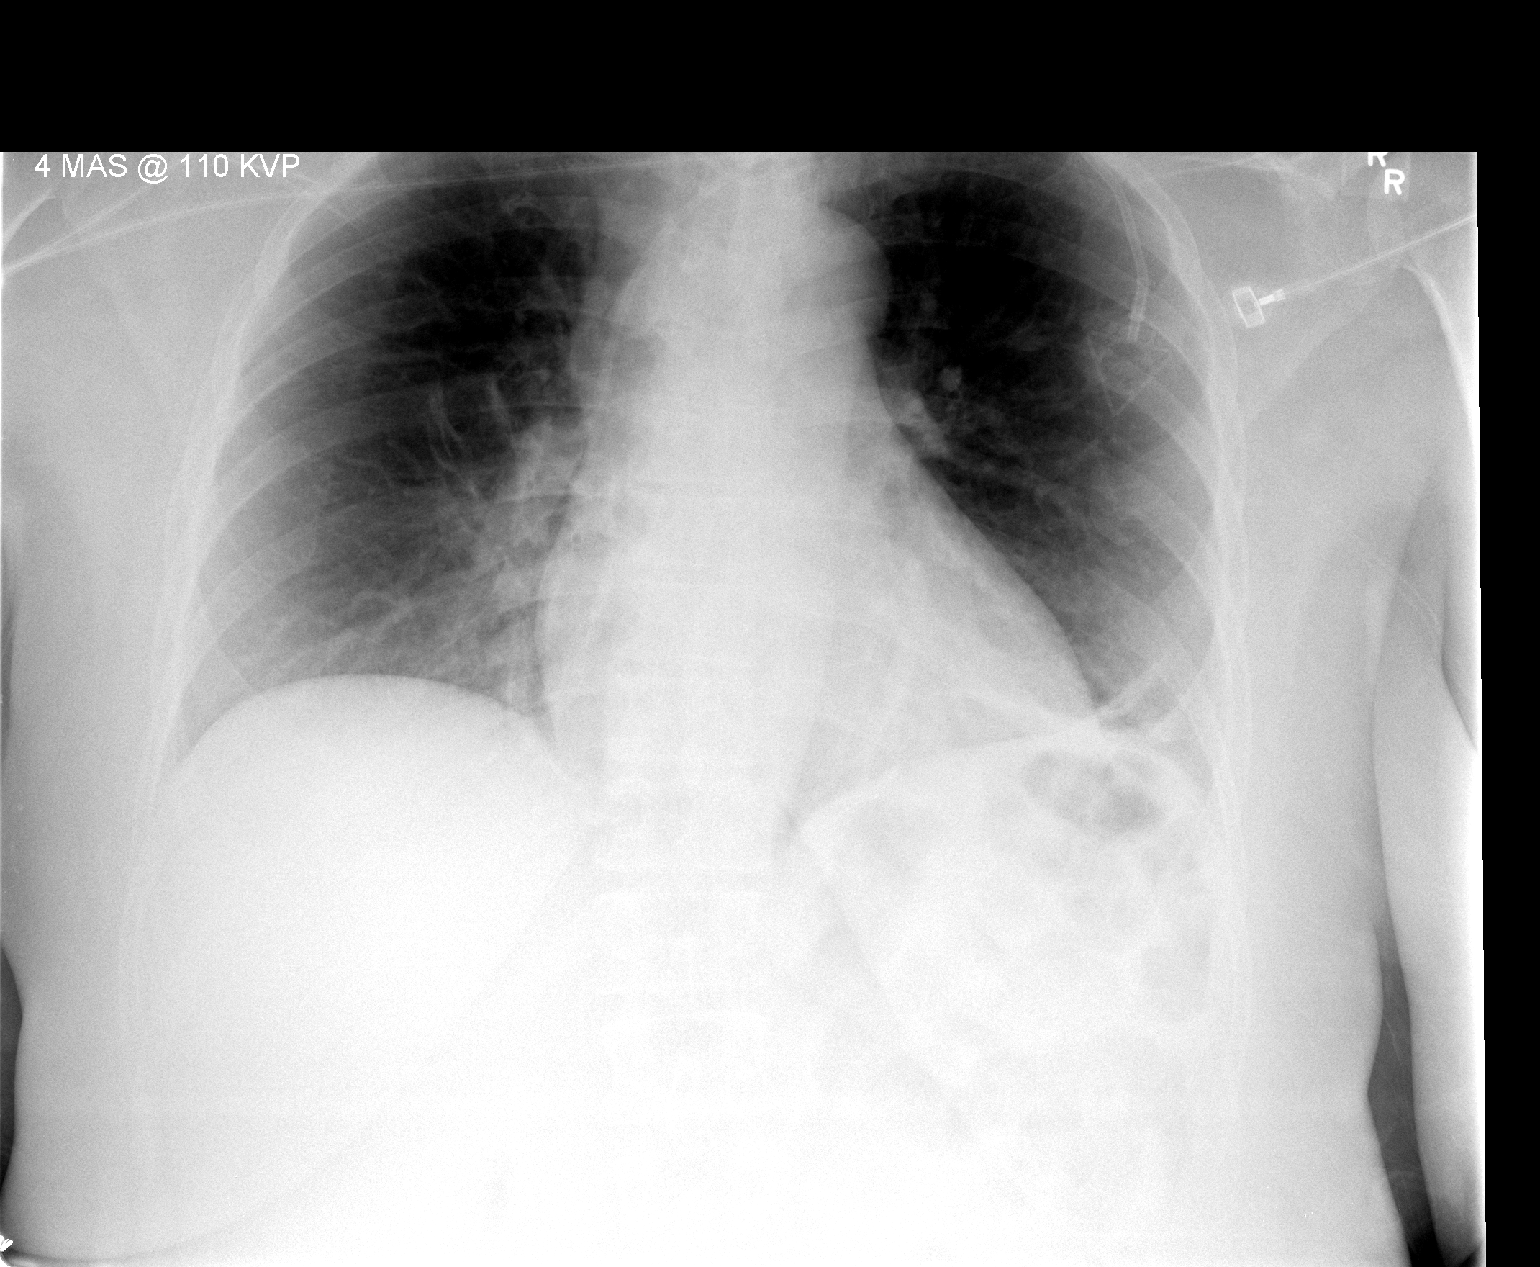

[1 of 1 positions shown; findings below may reference images not displayed]

FINDINGS: 2335 hours. Cardiopericardial silhouette is at upper
limits of normal for size. There is no edema, pneumothorax, or
pleural effusion.  Left base atelectasis is evident.  A left Port-A-
Cath is identified with the distal catheter tip position at the mid
SVC level.
IMPRESSION: Status post left Port-A-Cath placement with distal catheter tip
positioned in the mid SVC.  No pneumothorax or pleural effusion.

Left base atelectasis.

## 2010-08-26 ENCOUNTER — Encounter: Payer: Self-pay | Admitting: General Surgery

## 2010-08-26 ENCOUNTER — Encounter: Payer: Self-pay | Admitting: Obstetrics and Gynecology

## 2010-08-26 ENCOUNTER — Encounter: Payer: Self-pay | Admitting: Surgery

## 2010-09-04 NOTE — Letter (Signed)
Summary: [facility]  [facility]   Imported By: Maryln Gottron 07/04/2010 09:58:49  _____________________________________________________________________  External Attachment:    Type:   Image     Comment:   External Document

## 2010-09-04 NOTE — Letter (Signed)
[facility]   Im[facility] By: Maryln Gottron 02/21/2010 10:35:20  _____________________________________________________________________  External Attachment:    Type:   Image     Comment:   External Document

## 2010-09-04 NOTE — Letter (Signed)
[facility]   Imported By: Maryln Gottron 11/15/2009 15:19:24  _____________________________________________________________________  External Attachment:    Type:   Image     Comment:   External Document

## 2010-09-04 NOTE — Letter (Signed)
[facility]   Imported By: Maryln Gottron 11/02/2009 13:42:38  _____________________________________________________________________  External Attachment:    Type:   Image     Comment:   External Document

## 2010-09-04 NOTE — Letter (Signed)
[facility]   Imported By: Maryln Gottron 12/21/2009 14:19:15  _____________________________________________________________________  External Attachment:    Type:   Image     Comment:   External Document

## 2010-09-04 NOTE — Letter (Signed)
Summary: [facility]   Imported By: Maryln Gottron 11/02/2009 10:40:09  _____________________________________________________________________  External Attachment:    Type:   Image     Comment:   External Document

## 2010-09-04 NOTE — Progress Notes (Signed)
Summary: lab results  Phone Note Call from Patient Call back at Home Phone [phone]   [name]: Patient Call For: Stacie Glaze MD Summary of Call: calling for test results- bloodwork   Initial call taken by: Raechel Ache, RN,  October 05, 2009 4:32 PM  Follow-up for Phone Call        iron levles high, stop all iron suppliments if not on not iron supplements get a liver panel repeat iron with liver panel  995.20 vit d low start 50,000 international units weekly for 3 months Follow-up by: Stacie Glaze MD,  October 06, 2009 9:20 AM  Additional Follow-up for Phone Call Additional follow up Details #1::        LMTCB Additional Follow-up by: Raechel Ache, RN,  October 06, 2009 9:36 AM  New Problems: HYPOGLYCEMIA, UNSPECIFIED (ICD-251.2)   Additional Follow-up for Phone Call Additional follow up Details #2::    patient received iron infusion 08/25/09 because levels were low. She thinks she needs Hem A1C done and I didn't see that. Should she come back?  added an A1C the increased iron may be from the infusion.... ( she dide not share that!) so nothing do do for that should know the A1C next week Follow-up by: Raechel Ache, RN,  October 06, 2009 2:36 PM  Additional Follow-up for Phone Call Additional follow up Details #3:: Details for Additional Follow-up Action Taken: called. Additional Follow-up by: Raechel Ache, RN,  October 06, 2009 4:12 PM  New Problems: HYPOGLYCEMIA, UNSPECIFIED (ICD-251.2) New/Updated Medications: VITAMIN D (ERGOCALCIFEROL) 50000 UNIT CAPS (ERGOCALCIFEROL) 1 q week Prescriptions: VITAMIN D (ERGOCALCIFEROL) 50000 UNIT CAPS (ERGOCALCIFEROL) 1 q week  #12 x 0   Entered by:   Raechel Ache, RN   Authorized by:   Stacie Glaze MD   Signed by:   Raechel Ache, RN on 10/06/2009   Method used:   Electronically to        [facility]* (retail)       [address]       [address] Shipping/mailing       [place], [place]   [address]       Ph: [phone]       Fax: [phone]   RxID:   [id number]

## 2010-09-04 NOTE — Letter (Signed)
Summary: Records from Dr. Smith Mince 2005 - 2009  Records from Dr. Smith Mince 2005 - 2009   Imported By: Maryln Gottron 03/16/2010 09:36:52  _____________________________________________________________________  External Attachment:    Type:   Image     Comment:   External Document

## 2010-09-04 NOTE — Letter (Signed)
[facility]   Imported By: Maryln Gottron 02/19/2010 11:23:21  _____________________________________________________________________  External Attachment:    Type:   Image     Comment:   External Document

## 2010-09-04 NOTE — Letter (Signed)
[facility]   Imported By: Maryln Gottron 01/09/2010 15:52:35  _____________________________________________________________________  External Attachment:    Type:   Image     Comment:   External Document

## 2010-09-04 NOTE — Letter (Signed)
[facility]   Imported By: Maryln Gottron 03/29/2010 09:06:27  _____________________________________________________________________  External Attachment:    Type:   Image     Comment:   External Document

## 2010-09-04 NOTE — Letter (Signed)
[facility]   Imported By: Maryln Gottron 01/15/2010 10:22:12  _____________________________________________________________________  External Attachment:    Type:   Image     Comment:   External Document

## 2010-09-04 NOTE — Letter (Signed)
[facility]   Imported By: Maryln Gottron 11/15/2009 15:14:04  _____________________________________________________________________  External Attachment:    Type:   Image     Comment:   External Document

## 2010-09-04 NOTE — Letter (Signed)
[facility]   Imported By: Maryln Gottron 11/02/2009 13:43:48  _____________________________________________________________________  External Attachment:    Type:   Image     Comment:   External Document

## 2010-09-04 NOTE — Letter (Signed)
[facility]   Imported By: Maryln Gottron 02/02/2010 11:17:44  _____________________________________________________________________  External Attachment:    Type:   Image     Comment:   External Document

## 2010-09-04 NOTE — Letter (Signed)
Summary: [facility]  [facility]   Imported By: Maryln Gottron 05/24/2010 14:04:39  _____________________________________________________________________  External Attachment:    Type:   Image     Comment:   External Document

## 2010-09-04 NOTE — Letter (Signed)
[facility]   Imported By: Maryln Gottron 03/15/2010 12:57:57  _____________________________________________________________________  External Attachment:    Type:   Image     Comment:   External Document

## 2010-09-04 NOTE — Letter (Signed)
[facility]   Imported By: Maryln Gottron 12/18/2009 12:23:52  _____________________________________________________________________  External Attachment:    Type:   Image     Comment:   External Document

## 2010-09-04 NOTE — Letter (Signed)
[facility]   Imported By: Maryln Gottron 10/26/2009 12:39:17  _____________________________________________________________________  External Attachment:    Type:   Image     Comment:   External Document

## 2010-09-04 NOTE — Letter (Signed)
[facility]   Imported By: Maryln Gottron 12/28/2009 13:39:17  _____________________________________________________________________  External Attachment:    Type:   Image     Comment:   External Document

## 2010-09-04 NOTE — Letter (Signed)
[facility]   Imported By: Maryln Gottron 01/24/2010 13:13:00  _____________________________________________________________________  External Attachment:    Type:   Image     Comment:   External Document

## 2010-09-04 NOTE — Letter (Signed)
[facility]   Imported By: Maryln Gottron 12/26/2009 13:04:32  _____________________________________________________________________  External Attachment:    Type:   Image     Comment:   External Document

## 2010-09-04 NOTE — Letter (Signed)
Summary: [facility]   Imported By: Maryln Gottron 05/02/2010 11:09:46  _____________________________________________________________________  External Attachment:    Type:   Image     Comment:   External Document

## 2010-09-04 NOTE — Letter (Signed)
[facility]   Imported By: Maryln Gottron 01/18/2010 09:17:56  _____________________________________________________________________  External Attachment:    Type:   Image     Comment:   External Document

## 2010-09-04 NOTE — Letter (Signed)
[facility]   Imported By: Maryln Gottron 02/12/2010 10:54:16  _____________________________________________________________________  External Attachment:    Type:   Image     Comment:   External Document

## 2010-09-04 NOTE — Letter (Signed)
Summary: [facility]   Imported By: Maryln Gottron 03/14/2010 09:31:43  _____________________________________________________________________  External Attachment:    Type:   Image     Comment:   External Document

## 2010-09-04 NOTE — Letter (Signed)
[facility]   Imported By: Maryln Gottron 02/21/2010 10:37:07  _____________________________________________________________________  External Attachment:    Type:   Image     Comment:   External Document

## 2010-09-04 NOTE — Assessment & Plan Note (Signed)
Summary: to be est/ok per doc/njr   Vital Signs:  Patient profile:   50 year old female Height:      65 inches Weight:      180 pounds BMI:     30.06 Temp:     98.2 degrees F oral Pulse rate:   72 / minute Resp:     14 per minute BP sitting:   130 / 80  (left arm)  Vitals Entered By: Willy Eddy, LPN (October 02, 2009 2:33 PM) CC: new pt to establish--just diagnosed with breast cancer and had first chemo last week   CC:  new pt to establish--just diagnosed with breast cancer and had first chemo last week.  History of Present Illness: Dr Pennie Rushing diagnosed breast Cancer ( imaging showed a mass and biopsy showed cancer) Who is the surgeon... Dr Donell Beers.... MRI  Masectomy was recommended 10.7 cm mass Dr Darnelle Catalan was the oncologist Timing of the lesions growth The pt has been getting mamograms port placed and chemo for 20 weeks then Echo, pet scan done possible lesion on the liver?? not  hypermetabolic  then surgery ( periodic reevaluations)  Preventive Screening-Counseling & Management  Alcohol-Tobacco     Smoking Status: never  Caffeine-Diet-Exercise     Does Patient Exercise: yes      Drug Use:  no.    Problems Prior to Update: 1)  Family History Diabetes 1st Degree Relative  (ICD-V18.0) 2)  Osteoarthritis  (ICD-715.90) 3)  Anemia-nos  (ICD-285.9) 4)  Breast Cancer, Hx of  (ICD-V10.3)  Medications Prior to Update: 1)  None  Current Medications (verified): 1)  Lorazepam 0.5 Mg Tabs (Lorazepam) .Marland Kitchen.. 1 Two Times A Day As Needed Nervousnessr 2)  Prochlorperazine Maleate 10 Mg Tabs (Prochlorperazine Maleate) .Marland Kitchen.. 1 At Bedtime As Needed Nerves 3)  Dexamethasone 4 Mg Tabs (Dexamethasone) .... 3 Tim Es A Day As Needed Nausea 4)  Zolpidem Tartrate 10 Mg Tabs (Zolpidem Tartrate) .Marland Kitchen.. 1 At Bedtime As Needed Sleep  Allergies (verified): No Known Drug Allergies  Past History:  Family History: Last updated: 10/02/2009 Family History of Arthritis Family  History Diabetes 1st degree relative Family History Hypertension  Social History: Last updated: 10/02/2009 Occupation:healthcare interpreter Married Never Smoked Alcohol use-yes Drug use-no Regular exercise-yes  Risk Factors: Exercise: yes (10/02/2009)  Risk Factors: Smoking Status: never (10/02/2009)  Past medical, surgical, family and social histories (including risk factors) reviewed, and no changes noted (except as noted below).  Past Medical History: Breast cancer, hx of just dx 08-2009 Anemia-NOS Osteoarthritis  Past Surgical History: Caesarean section 1996 and 2001 port-a -cath placement for chemo  Family History: Reviewed history and no changes required. Family History of Arthritis Family History Diabetes 1st degree relative Family History Hypertension  Social History: Reviewed history and no changes required. Occupation:healthcare interpreter Married Never Smoked Alcohol use-yes Drug use-no Regular exercise-yes Smoking Status:  never Drug Use:  no Does Patient Exercise:  yes  Review of Systems  The patient denies anorexia, fever, weight loss, weight gain, vision loss, decreased hearing, hoarseness, chest pain, syncope, dyspnea on exertion, peripheral edema, prolonged cough, headaches, hemoptysis, abdominal pain, melena, hematochezia, severe indigestion/heartburn, hematuria, incontinence, genital sores, muscle weakness, suspicious skin lesions, transient blindness, difficulty walking, depression, unusual weight change, abnormal bleeding, enlarged lymph nodes, angioedema, and breast masses.    Physical Exam  General:  Well-developed,well-nourished,in no acute distress; alert,appropriate and cooperative throughout examination Eyes:  pupils equal and pupils round.   Ears:  R ear normal and L ear normal.  Nose:  no external deformity and no nasal discharge.   Lungs:  Normal respiratory effort, chest expands symmetrically. Lungs are clear to auscultation,  no crackles or wheezes. Heart:  Normal rate and regular rhythm. S1 and S2 normal without gallop, murmur, click, rub or other extra sounds.   Impression & Recommendations:  Problem # 1:  ANEMIA-NOS (ICD-285.9)  moniter b12 folate,    Orders: TLB-B12 + Folate Pnl (66440_34742-V95/GLO) TLB-IBC Pnl (Iron/FE;Transferrin) (83550-IBC)  Problem # 2:  MALIGNANT NEOPLASM OF BREAST UNSPECIFIED SITE (ICD-174.9) moniter vit d  Problem # 3:  ESOPHAGEAL REFLUX (ICD-530.81)  the pt may develop reflux on chem and give rx for carafate  Her updated medication list for this problem includes:    Carafate 1 Gm/50ml Susp (Sucralfate) ..... One gram by mouth three times a day as needed  Complete Medication List: 1)  Lorazepam 0.5 Mg Tabs (Lorazepam) .Marland Kitchen.. 1 two times a day as needed nervousnessr 2)  Prochlorperazine Maleate 10 Mg Tabs (Prochlorperazine maleate) .Marland Kitchen.. 1 at bedtime as needed nerves 3)  Dexamethasone 4 Mg Tabs (Dexamethasone) .... 3 tim es a day as needed nausea 4)  Zolpidem Tartrate 10 Mg Tabs (Zolpidem tartrate) .Marland Kitchen.. 1 at bedtime as needed sleep 5)  Carafate 1 Gm/20ml Susp (Sucralfate) .... One gram by mouth three times a day as needed  Other Orders: Venipuncture (75643) T-Vitamin D (25-Hydroxy) [id number])  Patient Instructions: 1)   check a vitamin d and replace as indicated 2)  check a HgA1C and treat as indicated 3)  two to three times a week check a fast blood sugar  ( can go up to 120 safely)  if greated call 4)  ( both the medrol and the chemo elevate blood sugar, watch sweets and emphasis on complex carb ( grains) and lean proteins 5)  to protest the GI tract ( stomach) give a prescription for carafate slurry to "coat" the  esophagus and stomach" not absorbed 6)  can also help with diarrhea... 7)  keep hydrated water, propel, gingerale helps with nausia 8)  Please schedule a follow-up appointment in 2 months. Prescriptions: CARAFATE 1 GM/10ML SUSP (SUCRALFATE) one gram by  mouth three times a day as needed  #90 gm  or 1L x 3   Entered and Authorized by:   Stacie Glaze MD   Signed by:   Stacie Glaze MD on 10/02/2009   Method used:   Electronically to        [facility]* (retail)       [address]       [address] Shipping/mailing       [place], [place]  [address]       Ph: [phone]       Fax: [phone]   RxID:   [id number]    Preventive Care Screening  Pap Smear:    Date:  08/24/2009    Next Due:  09/2010    Results:  abnormal   Mammogram:    Date:  09/02/2009    Results:  abnormal   Last Tetanus Booster:    Date:  08/05/2008    Results:  Td   Appended Document: to be est/ok per doc/njr     History of Present Illness: NEW TO ESTABLE WITH HX OF NEWLY DIAGNOSES BREAST CANCER  Allergies: No Known Drug Allergies  Physical Exam  General:  Well-developed,well-nourished,in no acute distress; alert,appropriate and cooperative throughout examination Eyes:  pupils equal and pupils round.   Ears:  R ear normal and L ear normal.   Nose:  no external deformity and no nasal discharge.   Mouth:  good dentition and pharynx pink and moist.   Chest Wall:  PORT  IN PLACE, MILD SOFT TISSUE SWELLING Lungs:  normal respiratory effort and no wheezes.   Heart:  normal rate and regular rhythm.   Abdomen:  soft and non-tender.     Complete Medication List: 1)  Lorazepam 0.5 Mg Tabs (Lorazepam) .Marland Kitchen.. 1 two times a day as needed nervousnessr 2)  Prochlorperazine Maleate 10 Mg Tabs (Prochlorperazine maleate) .Marland Kitchen.. 1 at bedtime as needed nerves 3)  Dexamethasone 4 Mg Tabs (Dexamethasone) .... 3 tim es a day as needed nausea 4)  Zolpidem Tartrate 10 Mg Tabs (Zolpidem tartrate) .Marland Kitchen.. 1 at bedtime as needed sleep 5)  Carafate 1 Gm/38ml Susp (Sucralfate) .... One gram by mouth three times a day as needed

## 2010-09-06 NOTE — Letter (Signed)
Summary: [facility]  [facility]   Imported By: Maryln Gottron 08/02/2010 13:37:09  _____________________________________________________________________  External Attachment:    Type:   Image     Comment:   External Document

## 2010-09-06 NOTE — Letter (Signed)
Summary: [facility]  [facility]   Imported By: Maryln Gottron 07/31/2010 13:00:33  _____________________________________________________________________  External Attachment:    Type:   Image     Comment:   External Document

## 2010-09-19 ENCOUNTER — Inpatient Hospital Stay (INDEPENDENT_AMBULATORY_CARE_PROVIDER_SITE_OTHER)
Admission: RE | Admit: 2010-09-19 | Discharge: 2010-09-19 | Disposition: A | Discharge status: Home / Self Care | Payer: 59 | Source: Ambulatory Visit | Attending: Emergency Medicine | Admitting: Emergency Medicine

## 2010-09-19 DIAGNOSIS — J020 Streptococcal pharyngitis: Secondary | ICD-10-CM

## 2010-09-19 LAB — POCT RAPID STREP A (OFFICE): Streptococcus, Group A Screen (Direct): POSITIVE — AB

## 2010-10-17 ENCOUNTER — Encounter: Payer: Self-pay | Admitting: Internal Medicine

## 2010-10-18 ENCOUNTER — Encounter: Payer: Self-pay | Admitting: Family Medicine

## 2010-10-18 ENCOUNTER — Ambulatory Visit (INDEPENDENT_AMBULATORY_CARE_PROVIDER_SITE_OTHER): Payer: 59 | Admitting: Family Medicine

## 2010-10-18 VITALS — BP 120/80 | Temp 98.6°F | Ht 65.25 in | Wt 191.0 lb

## 2010-10-18 DIAGNOSIS — L02619 Cutaneous abscess of unspecified foot: Secondary | ICD-10-CM

## 2010-10-18 DIAGNOSIS — L03039 Cellulitis of unspecified toe: Secondary | ICD-10-CM

## 2010-10-18 LAB — CBC
HCT: 39.2 % (ref 36.0–46.0)
MCH: 32 pg (ref 26.0–34.0)
MCV: 93.6 fL (ref 78.0–100.0)
Platelets: 307 10*3/uL (ref 150–400)
RBC: 4.19 MIL/uL (ref 3.87–5.11)

## 2010-10-18 LAB — COMPREHENSIVE METABOLIC PANEL
ALT: 22 U/L (ref 0–35)
AST: 23 U/L (ref 0–37)
Calcium: 9.7 mg/dL (ref 8.4–10.5)
Chloride: 101 mEq/L (ref 96–112)
Total Protein: 8.3 g/dL (ref 6.0–8.3)

## 2010-10-18 MED ORDER — CEPHALEXIN 500 MG PO CAPS: 500.0000 mg | ORAL_CAPSULE | Freq: Three times a day (TID) | ORAL | Status: DC

## 2010-10-18 NOTE — Patient Instructions (Signed)
Continue salt water soaks at least twice daily. Be in touch by next week if no better.

## 2010-10-18 NOTE — Progress Notes (Signed)
  Subjective:    Patient ID: Miranda Orozco, female    DOB: 1961/03/13, 50 y.o.   MRN: [id number]  HPI  patient seen with right fifth toe irritation and redness for approximately one and1/2 weeks. No injury. No history of diabetes. Patient has some dry skin which she clipped prior to onset of redness. Has had redness swelling and soreness with clear drainage. Symptoms actually somewhat better today compared to yesterday. Soaking in Epsom salts past few days. No fever or chills. No known drug allergies.   recent history significant for breast cancer diagnosed last year and completed chemotherapy in November.   Review of Systems     Objective:   Physical Exam  patient alert and in no distress Chest clear to auscultation Heart regular rhythm and rate Extremities right foot reveals some redness and mild swelling of the fifth toe. She has minimal amount of crusted drainage along the border of the nail bordering the fourth toe. No warmth. Minimally tender. No fluctuance.       Assessment & Plan:   cellulitis right fifth toe. Cephalexin 500 mg 3 times a day for 7 days. Continue saltwater soaks twice daily.   Touch base next week if no better

## 2010-10-19 LAB — CBC
HCT: 37.5 % (ref 36.0–46.0)
HCT: 38.8 % (ref 36.0–46.0)
MCH: 31.8 pg (ref 26.0–34.0)
MCHC: 33.9 g/dL (ref 30.0–36.0)
MCHC: 33.2 g/dL (ref 30.0–36.0)
MCV: 93.8 fL (ref 78.0–100.0)
MCV: 94.9 fL (ref 78.0–100.0)
RDW: 14.3 % (ref 11.5–15.5)
RDW: 15.1 % (ref 11.5–15.5)

## 2010-10-19 LAB — COMPREHENSIVE METABOLIC PANEL
BUN: 7 mg/dL (ref 6–23)
Calcium: 9.5 mg/dL (ref 8.4–10.5)
Glucose, Bld: 128 mg/dL — ABNORMAL HIGH (ref 70–99)
Total Protein: 7.1 g/dL (ref 6.0–8.3)

## 2010-10-19 LAB — SURGICAL PCR SCREEN
MRSA, PCR: NEGATIVE
Staphylococcus aureus: NEGATIVE

## 2010-10-19 LAB — DIFFERENTIAL
Basophils Relative: 0 % (ref 0–1)
Lymphs Abs: 1.1 10*3/uL (ref 0.7–4.0)
Monocytes Relative: 11 % (ref 3–12)
Neutro Abs: 5.1 10*3/uL (ref 1.7–7.7)
Neutrophils Relative %: 72 % (ref 43–77)

## 2010-10-21 LAB — CREATININE, SERUM
Creatinine, Ser: 0.62 mg/dL (ref 0.4–1.2)
GFR calc non Af Amer: >60 mL/min (ref >60)

## 2010-10-24 LAB — PREGNANCY, URINE: Preg Test, Ur: NEGATIVE

## 2010-12-21 NOTE — Op Note (Signed)
NAME:  Orozco, Miranda                ACCOUNT NO.:  [id number]   MEDICAL RECORD NO.:  [id number]          PATIENT TYPE:  OBV   LOCATION:  1610                         FACILITY:  [facility]   PHYSICIAN:  Gita Kudo, M.D. DATE OF BIRTH:  [birth year]   DATE OF PROCEDURE:  09/11/2004  DATE OF DISCHARGE:                                 OPERATIVE REPORT   OPERATIVE PROCEDURE:  Excise intact abscess, infected sebaceous cyst left  chest wall.   SURGEON:  Gita Kudo, M.D.   ANESTHESIA:  General endotracheal.   PREOPERATIVE DIAGNOSIS:  Abscess left chest wall with cellulitis measured a  11 95 cm.   POSTOPERATIVE DIAGNOSIS:  Of ruptured epidermal cyst with infected cyst  contents and gross pus.   CLINICAL SUMMARY:  A 50 year old female in good general health with a  history of a lump beneath her left breast that has been present for years  and was told that it could be left alone. Recently, however, its swell  became painful. Seen in the emergency room, given IV antibiotics and an oral  prescription. Then followed up in Dr. Stephannie Peters office yesterday with  cellulitis and tenderness and I saw her in my office, felt surgery is  indicated and the posted her for today.   OPERATIVE FINDINGS:  The patient has an area of cellulitis that measured 11  x 5 cm. The mass was a necrotic inflammatory ruptured sebaceous cyst as well  as gross pus itself.   OPERATIVE PROCEDURE:  Under satisfactory general anesthesia, the patient was  positioned, prepped and draped in standard fashion. She had Ancef given IV  preop. A horizontally directed incision was made centered over the mass and  I got into pus and infected cyst contents immediately. Then using scalpel, I  excised the cyst as well as its contents and down to the base. Finger  dissection was then used to probe medially and laterally into two pockets of  necrotic breast and abdominal wall tissue. When I removed all the necrotic  tissue and  opened up all the cavities, the  wound was made dry by cautery. Then infiltrated with Marcaine and packed  with three 4x4 gauzes. They were soaked in dilute Betadine. A sterile  absorbent dressing was then applied. The patient went to the recovery room  in good condition. Will keep her overnight for IV antibiotics.      MRL/MEDQ  D:  09/11/2004  T:  09/11/2004  Job:  [id number]   cc:   Talmadge Coventry, M.D.  [address]  [place]  [place] [address]  Fax: [phone]

## 2010-12-31 ENCOUNTER — Inpatient Hospital Stay (INDEPENDENT_AMBULATORY_CARE_PROVIDER_SITE_OTHER)
Admission: RE | Admit: 2010-12-31 | Discharge: 2010-12-31 | Disposition: A | Discharge status: Home / Self Care | Payer: 59 | Source: Ambulatory Visit | Attending: Family Medicine | Admitting: Family Medicine

## 2010-12-31 DIAGNOSIS — N390 Urinary tract infection, site not specified: Secondary | ICD-10-CM

## 2010-12-31 LAB — POCT URINALYSIS DIP (DEVICE)
Glucose, UA: 250 mg/dL — AB
Nitrite: POSITIVE — AB
Urobilinogen, UA: 1 mg/dL (ref 0.0–1.0)

## 2011-01-01 ENCOUNTER — Telehealth: Payer: Self-pay | Admitting: *Deleted

## 2011-01-01 LAB — URINE CULTURE

## 2011-01-01 NOTE — Telephone Encounter (Signed)
[facility] Triage Call Report Triage Record Num: [id number] Operator: Craig Guess Patient Name: Miranda Orozco Call Date & Time: 12/31/2010 9:42:08AM Patient Phone: [phone] PCP: Darryll Capers Patient Gender: Female PCP Fax : [phone] Patient DOB: [birth year] [facility] Name: [facility] Reason for Call: [name] reports she has sxs of uti that began yesterday, Sunday 5/27. Sxs include freq, urgency and pain when she voids. Afebrile. [name] advised she should be seen for eval of sxs per Urinary Sxs Protocol/Practice profile. Advised we do not call in meds when office is closed per MD. [name] states, "I really do not want to sit for hrs at [facility] today." Dr Yetta Barre paged as requested. MD called, no answer. RN to fu per CAN protocol. Return call from Dr Yetta Barre, Info given. New orders, "Bactrim DS 1 po bid x 5 days, #10, No Rf." No answer at cb # to obtain pharmacy info, LM to callback and any RN can call in Rx after looking at note from MD. Protocol(s) Used: Urinary Symptoms - Female Recommended Outcome per Protocol: See Provider within 24 hours Reason for Outcome: Has one or more urinary tract symptoms Care Advice: ~ 12/31/2010 12:10:15PM Page 1 of 1 [email or web address]

## 2011-01-07 ENCOUNTER — Encounter (INDEPENDENT_AMBULATORY_CARE_PROVIDER_SITE_OTHER): Payer: Self-pay | Admitting: Surgery

## 2011-01-22 ENCOUNTER — Other Ambulatory Visit: Payer: Self-pay | Admitting: Oncology

## 2011-01-22 ENCOUNTER — Encounter (HOSPITAL_BASED_OUTPATIENT_CLINIC_OR_DEPARTMENT_OTHER): Payer: 59 | Admitting: Oncology

## 2011-01-22 DIAGNOSIS — Z170 Estrogen receptor positive status [ER+]: Secondary | ICD-10-CM

## 2011-01-22 DIAGNOSIS — D0590 Unspecified type of carcinoma in situ of unspecified breast: Secondary | ICD-10-CM

## 2011-01-22 DIAGNOSIS — C50419 Malignant neoplasm of upper-outer quadrant of unspecified female breast: Secondary | ICD-10-CM

## 2011-01-22 DIAGNOSIS — F411 Generalized anxiety disorder: Secondary | ICD-10-CM

## 2011-01-22 LAB — CBC WITH DIFFERENTIAL/PLATELET
Basophils Absolute: 0 10*3/uL (ref 0.0–0.1)
Eosinophils Absolute: 0.1 10*3/uL (ref 0.0–0.5)
HGB: 12.8 g/dL (ref 11.6–15.9)
MCV: 89.5 fL (ref 79.5–101.0)
MONO#: 0.4 10*3/uL (ref 0.1–0.9)
MONO%: 6.6 % (ref 0.0–14.0)
NEUT#: 3.9 10*3/uL (ref 1.5–6.5)
RDW: 13.7 % (ref 11.2–14.5)
WBC: 5.5 10*3/uL (ref 3.9–10.3)
lymph#: 1.1 10*3/uL (ref 0.9–3.3)

## 2011-01-22 LAB — COMPREHENSIVE METABOLIC PANEL
Albumin: 4 g/dL (ref 3.5–5.2)
BUN: 14 mg/dL (ref 6–23)
CO2: 30 mEq/L (ref 19–32)
Calcium: 9.7 mg/dL (ref 8.4–10.5)
Chloride: 102 mEq/L (ref 96–112)
Glucose, Bld: 151 mg/dL — ABNORMAL HIGH (ref 70–99)
Potassium: 3.4 mEq/L — ABNORMAL LOW (ref 3.5–5.3)

## 2011-01-23 ENCOUNTER — Other Ambulatory Visit: Payer: Self-pay | Admitting: Radiology

## 2011-01-23 LAB — CANCER ANTIGEN 27.29: CA 27.29: 22 U/mL (ref 0–39)

## 2011-02-05 ENCOUNTER — Encounter (HOSPITAL_BASED_OUTPATIENT_CLINIC_OR_DEPARTMENT_OTHER): Payer: 59 | Admitting: Oncology

## 2011-02-05 DIAGNOSIS — C50419 Malignant neoplasm of upper-outer quadrant of unspecified female breast: Secondary | ICD-10-CM

## 2011-02-05 DIAGNOSIS — Z170 Estrogen receptor positive status [ER+]: Secondary | ICD-10-CM

## 2011-02-27 ENCOUNTER — Ambulatory Visit (HOSPITAL_COMMUNITY)
Admission: RE | Admit: 2011-02-27 | Discharge: 2011-02-27 | Disposition: A | Discharge status: Home / Self Care | Payer: 59 | Source: Ambulatory Visit | Attending: Oncology | Admitting: Oncology

## 2011-02-27 DIAGNOSIS — Z9010 Acquired absence of unspecified breast and nipple: Secondary | ICD-10-CM | POA: Insufficient documentation

## 2011-02-27 DIAGNOSIS — Z0189 Encounter for other specified special examinations: Secondary | ICD-10-CM | POA: Insufficient documentation

## 2011-02-27 DIAGNOSIS — Z139 Encounter for screening, unspecified: Secondary | ICD-10-CM

## 2011-03-04 ENCOUNTER — Other Ambulatory Visit: Payer: Self-pay | Admitting: Oncology

## 2011-03-04 DIAGNOSIS — Z139 Encounter for screening, unspecified: Secondary | ICD-10-CM

## 2011-03-20 ENCOUNTER — Encounter (HOSPITAL_BASED_OUTPATIENT_CLINIC_OR_DEPARTMENT_OTHER): Payer: 59 | Admitting: Oncology

## 2011-03-20 ENCOUNTER — Other Ambulatory Visit: Payer: Self-pay | Admitting: Oncology

## 2011-03-20 DIAGNOSIS — C50419 Malignant neoplasm of upper-outer quadrant of unspecified female breast: Secondary | ICD-10-CM

## 2011-03-20 DIAGNOSIS — F411 Generalized anxiety disorder: Secondary | ICD-10-CM

## 2011-03-20 DIAGNOSIS — D0590 Unspecified type of carcinoma in situ of unspecified breast: Secondary | ICD-10-CM

## 2011-03-20 DIAGNOSIS — Z170 Estrogen receptor positive status [ER+]: Secondary | ICD-10-CM

## 2011-03-20 LAB — URINALYSIS, MICROSCOPIC - CHCC
Bilirubin (Urine): NEGATIVE
Glucose: NEGATIVE g/dL
Nitrite: NEGATIVE

## 2011-03-21 LAB — COMPREHENSIVE METABOLIC PANEL
AST: 22 U/L (ref 0–37)
Albumin: 4.1 g/dL (ref 3.5–5.2)
Alkaline Phosphatase: 91 U/L (ref 39–117)
BUN: 14 mg/dL (ref 6–23)
Calcium: 9.3 mg/dL (ref 8.4–10.5)
Chloride: 105 mEq/L (ref 96–112)
Glucose, Bld: 111 mg/dL — ABNORMAL HIGH (ref 70–99)
Potassium: 3.8 mEq/L (ref 3.5–5.3)
Sodium: 138 mEq/L (ref 135–145)
Total Protein: 7.4 g/dL (ref 6.0–8.3)

## 2011-03-21 LAB — HEMOGLOBIN A1C
Hgb A1c MFr Bld: 5.8 % — ABNORMAL HIGH (ref <5.7)
Mean Plasma Glucose: 120 mg/dL — ABNORMAL HIGH (ref <117)

## 2011-03-27 ENCOUNTER — Encounter (INDEPENDENT_AMBULATORY_CARE_PROVIDER_SITE_OTHER): Payer: Self-pay | Admitting: Surgery

## 2011-03-31 HISTORY — PX: SIMPLE MASTECTOMY: SHX2409

## 2011-04-25 LAB — POCT URINALYSIS DIP (DEVICE)
Bilirubin Urine: NEGATIVE
Ketones, ur: NEGATIVE
Nitrite: NEGATIVE
Protein, ur: NEGATIVE
pH: 6

## 2011-04-25 LAB — POCT PREGNANCY, URINE
Operator id: 270961
Preg Test, Ur: NEGATIVE

## 2011-04-25 LAB — URINE CULTURE: Colony Count: 30000

## 2011-06-03 ENCOUNTER — Encounter (INDEPENDENT_AMBULATORY_CARE_PROVIDER_SITE_OTHER): Payer: Self-pay | Admitting: General Surgery

## 2011-06-06 ENCOUNTER — Encounter (INDEPENDENT_AMBULATORY_CARE_PROVIDER_SITE_OTHER): Payer: Self-pay | Admitting: Surgery

## 2011-06-06 ENCOUNTER — Ambulatory Visit (INDEPENDENT_AMBULATORY_CARE_PROVIDER_SITE_OTHER): Payer: 59 | Admitting: Surgery

## 2011-06-06 VITALS — BP 116/84 | HR 68 | Temp 97.4°F | Resp 14 | Ht 65.0 in | Wt 197.6 lb

## 2011-06-06 DIAGNOSIS — Z853 Personal history of malignant neoplasm of breast: Secondary | ICD-10-CM

## 2011-06-06 NOTE — Progress Notes (Signed)
NAME: Trinika Steinhardt       DOB: [birth year]           DATE: 06/06/2011       MRN: [id number]   Miranda Orozco is a 50 y.o.Marland Kitchenfemale who presents for routine followup of her Right breast cancer diagnosed in 2011 and treated with neoadjuvant chemo, mastectomy and radiation. She has no problems or concerns on either side.  PFSH: She has had no significant changes since the last visit here.  ROS: There have been no significant changes since the last visit here  EXAM: General: The patient is alert, oriented, generally healty appearing, NAD. Mood and affect are normal.  Breasts:  Right side s/p mastectomy with some hyperpigmentation from radiation. Left is WNL  Lymphatics: She has no axillary or supraclavicular adenopathy on either side.  Extremities: Full ROM of the surgical side with no lymphedema noted.  Data Reviewed: Recent MRI negative  Impression: Doing well, with no evidence of recurrent cancer or new cancer  Plan: Will continue to follow up on an annual basis here. She is considering TRAM reconstruction and we discussed that for a while

## 2011-07-03 ENCOUNTER — Other Ambulatory Visit: Payer: Self-pay | Admitting: *Deleted

## 2011-07-03 DIAGNOSIS — C50419 Malignant neoplasm of upper-outer quadrant of unspecified female breast: Secondary | ICD-10-CM

## 2011-07-03 MED ORDER — TAMOXIFEN CITRATE 20 MG PO TABS: ORAL_TABLET | ORAL | Status: DC

## 2011-07-12 ENCOUNTER — Telehealth: Payer: Self-pay

## 2011-07-12 ENCOUNTER — Other Ambulatory Visit: Payer: Self-pay

## 2011-07-12 DIAGNOSIS — C50419 Malignant neoplasm of upper-outer quadrant of unspecified female breast: Secondary | ICD-10-CM

## 2011-07-12 MED ORDER — CEPHALEXIN 500 MG PO CAPS: 500.0000 mg | ORAL_CAPSULE | Freq: Three times a day (TID) | ORAL | Status: DC

## 2011-07-12 NOTE — Telephone Encounter (Signed)
Received message from pt stating that she had a question about an issue with her surgical arm.  Called pt back [phone], and she states she finished treatment last November for her breast cancer, but noticed that she has 2 lumps on her R forearm, which is the side her breast cancer was on, and where she had a mastectomy.  Pt states she noticed this about Sunday or Monday, and they have gotten larger, mostly since yesterday.  Pt states there are 2 lumps, 1 about 2" long, another about 1" long, on her posterior forearm in the center.  She states they are not painful, not fluid-filled, but feel slightly warm to the touch.  Pt denies any fever.  Per Debbora Presto, PA, pt to come in to office so she can look at this.  Pt verbalizes understanding.

## 2011-08-07 ENCOUNTER — Telehealth: Payer: Self-pay | Admitting: Oncology

## 2011-08-07 NOTE — Telephone Encounter (Signed)
l/m woth 09/05/11 appt and mailed sch,appts per mosaiq   aom

## 2011-08-12 ENCOUNTER — Ambulatory Visit (INDEPENDENT_AMBULATORY_CARE_PROVIDER_SITE_OTHER): Payer: 59 | Admitting: Internal Medicine

## 2011-08-12 ENCOUNTER — Encounter: Payer: Self-pay | Admitting: Internal Medicine

## 2011-08-12 VITALS — BP 118/80 | HR 72 | Temp 98.2°F | Resp 16 | Ht 65.25 in | Wt 198.0 lb

## 2011-08-12 DIAGNOSIS — K219 Gastro-esophageal reflux disease without esophagitis: Secondary | ICD-10-CM

## 2011-08-12 DIAGNOSIS — E162 Hypoglycemia, unspecified: Secondary | ICD-10-CM

## 2011-08-12 DIAGNOSIS — D649 Anemia, unspecified: Secondary | ICD-10-CM

## 2011-08-12 DIAGNOSIS — E669 Obesity, unspecified: Secondary | ICD-10-CM

## 2011-08-12 MED ORDER — PHENTERMINE HCL 37.5 MG PO TABS: 37.5000 mg | ORAL_TABLET | Freq: Every day | ORAL | Status: DC

## 2011-08-12 NOTE — Progress Notes (Signed)
Subjective:    Patient ID: Miranda Orozco, female    DOB: 26-Sep-1960, 51 y.o.   MRN: [id number]  HPI this is the first visit in several months for this patient who has been recently treated for breast cancer.  She has a history of esophageal reflux osteoarthritis and a working diagnosis of probable rheumatoid arthritis which has been in remission  Has a working diagnosis of RA from [facility] doctor.  Was treated with plaquinil about 9 years ago and now stopped the medication and uses prn NSAIDS as needed. Was noted to have increased blood glucoses with weight gain The Tamoxifen may be contributing to the weigth ( has been on this NOV 2011)  Review of Systems  Constitutional: Negative for activity change, appetite change and fatigue.  HENT: Negative for ear pain, congestion, neck pain, postnasal drip and sinus pressure.   Eyes: Negative for redness and visual disturbance.  Respiratory: Negative for cough, shortness of breath and wheezing.   Gastrointestinal: Negative for abdominal pain and abdominal distention.  Genitourinary: Negative for dysuria, frequency and menstrual problem.  Musculoskeletal: Negative for myalgias, joint swelling and arthralgias.  Skin: Negative for rash and wound.  Neurological: Negative for dizziness, weakness and headaches.  Hematological: Negative for adenopathy. Does not bruise/bleed easily.  Psychiatric/Behavioral: Negative for sleep disturbance and decreased concentration.   Past Medical History  Diagnosis Date  . MALIGNANT NEOPLASM OF BREAST UNSPECIFIED SITE 10/02/2009  . Hypoglycemia, unspecified 10/06/2009  . Esophageal reflux 10/02/2009  . OSTEOARTHRITIS 10/02/2009  . OSTEOPENIA 10/02/2009  . BREAST CANCER, HX OF 10/02/2009    History   Social History  . Marital Status: Married    Spouse Name: N/A    Number of Children: N/A  . Years of Education: N/A   Occupational History  . Not on file.   Social History Main Topics  . Smoking status: Former  Smoker -- 0.5 packs/day for 1 years    Types: Cigarettes    Quit date: 10/17/1981  . Smokeless tobacco: Never Used  . Alcohol Use: Yes  . Drug Use: Not on file  . Sexually Active: Not on file   Other Topics Concern  . Not on file   Social History Narrative  . No narrative on file    Past Surgical History  Procedure Date  . Cesarean section     1996, 2001  . Mastectomy partial / lumpectomy w/ axillary lymphadenectomy 03/12/2010    Right w [facility] Dr Jamey Ripa  . Breast lumpectomy 03/20/10    for margin  . Breast lumpectomy 03/26/2010    for margin  . Simple mastectomy 03/31/2011    For margin    Family History  Problem Relation Age of Onset  . Arthritis Other   . Diabetes Other   . Hypertension Other   . Hypertension Mother   . Hypertension Father   . Diabetes Father     No Known Allergies  No current outpatient prescriptions on file prior to visit.    BP 118/80  Pulse 72  Temp 98.2 F (36.8 C)  Resp 16  Ht 5' 5.25" (1.657 m)  Wt 198 lb (89.812 kg)  BMI 32.70 kg/m2       Objective:   Physical Exam  Nursing note and vitals reviewed. Constitutional: She is oriented to person, place, and time. She appears well-developed and well-nourished. No distress.  HENT:  Head: Normocephalic and atraumatic.  Right Ear: External ear normal.  Left Ear: External ear normal.  Nose: Nose normal.  Mouth/Throat:  Oropharynx is clear and moist.  Eyes: Conjunctivae and EOM are normal. Pupils are equal, round, and reactive to light.  Neck: Normal range of motion. Neck supple. No JVD present. No tracheal deviation present. No thyromegaly present.  Cardiovascular: Normal rate, regular rhythm, normal heart sounds and intact distal pulses.   No murmur heard. Pulmonary/Chest: Effort normal and breath sounds normal. She has no wheezes. She exhibits no tenderness.  Abdominal: Soft. Bowel sounds are normal.  Musculoskeletal: Normal range of motion. She exhibits no edema and no tenderness.    Lymphadenopathy:    She has no cervical adenopathy.  Neurological: She is alert and oriented to person, place, and time. She has normal reflexes. No cranial nerve deficit.  Skin: Skin is warm and dry. She is not diaphoretic.  Psychiatric: She has a normal mood and affect. Her behavior is normal.          Assessment & Plan:  We spent 30 minutes face-to-face counseling the patient about obesity its impact on her health immune system as well as strategies for weight loss.  We reviewed food choices exercise and talked about the use of appetite suppressants such as phentermine. After we explained the risks and benefits of this medication she agreed to a trial of appetite suppression and diet and exercise for weight control.  Her blood pressure is stable and we believe she is an excellent candidate for appetite suppression  Her weight plays a significant role in her gastroesophageal reflux

## 2011-08-12 NOTE — Patient Instructions (Signed)
The patient is instructed to continue all medications as prescribed. Schedule followup with check out clerk upon leaving the clinic  

## 2011-08-13 LAB — TSH: TSH: 1.71 u[IU]/mL (ref 0.35–5.50)

## 2011-08-13 LAB — T3, FREE: T3, Free: 2.8 pg/mL (ref 2.3–4.2)

## 2011-09-05 ENCOUNTER — Ambulatory Visit (HOSPITAL_BASED_OUTPATIENT_CLINIC_OR_DEPARTMENT_OTHER): Payer: 59 | Admitting: Oncology

## 2011-09-05 ENCOUNTER — Telehealth: Payer: Self-pay | Admitting: Oncology

## 2011-09-05 ENCOUNTER — Other Ambulatory Visit (HOSPITAL_BASED_OUTPATIENT_CLINIC_OR_DEPARTMENT_OTHER): Payer: 59 | Admitting: Lab

## 2011-09-05 VITALS — BP 132/83 | HR 79 | Temp 99.0°F | Ht 65.0 in | Wt 200.5 lb

## 2011-09-05 DIAGNOSIS — C50919 Malignant neoplasm of unspecified site of unspecified female breast: Secondary | ICD-10-CM

## 2011-09-05 DIAGNOSIS — Z170 Estrogen receptor positive status [ER+]: Secondary | ICD-10-CM

## 2011-09-05 DIAGNOSIS — C50419 Malignant neoplasm of upper-outer quadrant of unspecified female breast: Secondary | ICD-10-CM

## 2011-09-05 DIAGNOSIS — R490 Dysphonia: Secondary | ICD-10-CM

## 2011-09-05 DIAGNOSIS — D0590 Unspecified type of carcinoma in situ of unspecified breast: Secondary | ICD-10-CM

## 2011-09-05 DIAGNOSIS — Z853 Personal history of malignant neoplasm of breast: Secondary | ICD-10-CM

## 2011-09-05 DIAGNOSIS — F411 Generalized anxiety disorder: Secondary | ICD-10-CM

## 2011-09-05 LAB — CBC WITH DIFFERENTIAL/PLATELET
BASO%: 0.4 % (ref 0.0–2.0)
EOS%: 3.8 % (ref 0.0–7.0)
LYMPH%: 25.4 % (ref 14.0–49.7)
MCHC: 34.2 g/dL (ref 31.5–36.0)
MCV: 89.8 fL (ref 79.5–101.0)
MONO%: 10.2 % (ref 0.0–14.0)
Platelets: 219 10*3/uL (ref 145–400)
RBC: 4.12 10*6/uL (ref 3.70–5.45)

## 2011-09-05 NOTE — Telephone Encounter (Signed)
gve the pt her oct 2013 appt calendar °

## 2011-09-05 NOTE — Progress Notes (Signed)
ID: Miranda Orozco  DOB: 29-Oct-1960  MR#: [id number]  CSN#: [id number]   Interval History:   Miranda Orozco returns today for followup of her breast cancer. Interval history is generally unremarkable. She continues to be very active in the breast cancer community, particularly dealing with issues regarding breast density. She is going to be interviewed on T. the this coming week regarding that. Otherwise family is doing well Her daughter is now a Holiday representative at [employer].   ROS:  She has had some hoarseness now for about 2 months. It is not accompanied by cough, phlegm production, runny nose, shortness of breath, fever, or any other respiratory symptoms. She doesn't have obvious reflux symptoms. She also has discomfort in the right mastectomy area. She is now doing rehabilitation exercises fair at this point; in fact she is not exercising regularly currently. All this was discussed today. She has some cataracts developing and of course on tamoxifen this is a concern a detailed review of systems was otherwise unremarkable  No Known Allergies  Current Outpatient Prescriptions  Medication Sig Dispense Refill  . phentermine (ADIPEX-P) 37.5 MG tablet Take 1 tablet (37.5 mg total) by mouth daily before breakfast.  30 tablet  2  . tamoxifen (NOLVADEX) 20 MG tablet Take 20 mg by mouth daily.         PAST MEDICAL HISTORY:  Significant for a diagnosis of fibroadenoma made by left breast biopsy in June 2007, a history of C-section times two, a history of surgery for significant left chest wall cellulitis apparently arising from a sebaceous cyst, history of degenerative disk disease and a history of rheumatoid arthritis, which originally presented several years ago with high fevers and pain in the hips to the extent that she was not able to walk. This has been treated in the past with Plaquenil, although currently she is simply taking occasional Relafen for this.  The only other thing to mention regarding the past medical  history is that a recent hemoglobin A1c was 6.4, but again this test may not be entirely reliable in the setting of moderate anemia.  FAMILY HISTORY:  The patient's father is alive at age 89.  He has a history of type 2 diabetes, hypertension, and hypercholesterolemia.  The patient's mother is 50 with a history of hypertension.  GYNECOLOGIC HISTORY:  She is Gx, P3, first pregnancy age 51.  She stopped having menstrual period at the time of her chemotherapy.  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  Her husband Miranda Orozco of course is our Sales promotion account executive at [employer].  Their children are Miranda Orozco 16 who is studying early child development in college, Miranda Orozco who is severely handicapped with an undiagnosed genetic syndrome (has been extensively evaluated) and essentially needs total care. And Miranda Orozco 9.  The family attends [employer].  Objective:  Filed Vitals:   09/05/11 0956  BP: 132/83  Pulse: 79  Temp: 99 F (37.2 C)    BMI: Body mass index is 33.36 kg/(m^2).   ECOG FS: 0  Physical Exam:   Sclerae unicteric  Oropharynx clear  No peripheral adenopathy  Lungs clear -- no rales or rhonchi  Heart regular rate and rhythm  Abdomen benign  MSK no focal spinal tenderness, no peripheral edema  Neuro nonfocal  Breast exam: Right breast status post mastectomy. There is no evidence of local recurrence. The skin is fairly tight in that area. Left breast is unremarkable.  Lab Results:  Pending      Chemistry      Studies/Results:  BILATERAL BREAST MRI WITH AND WITHOUT CONTRAST  Technique: Multiplanar, multisequence MR images of both breasts  were obtained prior to and following the intravenous administration  of 20ml of Multihance. Three dimensional images were evaluated at  the independent DynaCad workstation.  Comparison: Prior mammograms 08/23/2009, 08/22/2009; previous MRI  exams 02/15/2010, 11/13/2009, and 08/28/2009    Findings: There is mild patient motion artifact. Images are  registered for motion, using the Dyna CAD software.  Patient has had interval right mastectomy. There is minimal  postoperative fluid along the mastectomy bed. There is edema in  the skin over the mastectomy bed. No suspicious enhancement in  this region.  On the left, breast parenchymal enhancement is moderate. No  suspicious enhancement is identified to suggest presence of  malignancy. No suspicious internal mammary or axillary lymph nodes  are identified.  IMPRESSION:  1. Postoperative changes on the right.  2. No MRI evidence for malignancy.  [facility] July 2012  Assessment: 51 year old [place] woman status post right breast biopsy January of 2011 for a low-grade invasive ductal carcinoma which was strongly estrogen and progesterone receptor positive, HER-2 negative, with an MIB-1 of 18%, in the setting of extensive ductal carcinoma in situ; treated neoadjuvantly with dxorubicin/ cyclophosphamide in dose dense fashion x4 followed by weekly paclitaxel x12 followed by lumpectomy and sentinel lymph node sampling 03/24/2010 showing no residual invasive disease but a 2.8 cm area of ductal carcinoma in situ, with positive margins.  All 4 sentinel lymph nodes were negative.  Margins could not be cleared despite repeated attempts and she underwent definitive mastectomy August of 2011.  She completed post mastectomy radiation including the right supraclavicular area in November of 2011 and started tamoxifen at that time.    Plan: She is doing very well from a breast cancer point of view. I am going to see her again in October. She will have labs now and labs then. She is scheduled for repeat left mammography in February of this year. When she reaches a two-year mark on tamoxifen we will consider switching to an aromatase inhibitor  I think she is having hoarseness secondary to reflux. I am putting her on omeprazole  at bedtime. She will call after 2 weeks and let us know whether or not that is working for her.  MAGRINAT,GUSTAV C 09/05/2011

## 2011-09-06 LAB — COMPREHENSIVE METABOLIC PANEL
ALT: 18 U/L (ref 0–35)
AST: 23 U/L (ref 0–37)
Alkaline Phosphatase: 95 U/L (ref 39–117)
Sodium: 140 mEq/L (ref 135–145)
Total Bilirubin: 0.3 mg/dL (ref 0.3–1.2)
Total Protein: 7.5 g/dL (ref 6.0–8.3)

## 2011-09-16 ENCOUNTER — Other Ambulatory Visit: Payer: Self-pay | Admitting: Oncology

## 2011-10-09 ENCOUNTER — Encounter: Payer: Self-pay | Admitting: Internal Medicine

## 2011-10-09 ENCOUNTER — Ambulatory Visit (INDEPENDENT_AMBULATORY_CARE_PROVIDER_SITE_OTHER): Payer: 59 | Admitting: Internal Medicine

## 2011-10-09 VITALS — BP 130/80 | HR 76 | Temp 98.2°F | Resp 16 | Ht 65.0 in | Wt 198.0 lb

## 2011-10-09 DIAGNOSIS — E669 Obesity, unspecified: Secondary | ICD-10-CM | POA: Insufficient documentation

## 2011-10-09 MED ORDER — PHENTERMINE HCL 37.5 MG PO CAPS: 37.5000 mg | ORAL_CAPSULE | ORAL | Status: DC

## 2011-10-09 MED ORDER — TAMOXIFEN CITRATE 20 MG PO TABS: 20.0000 mg | ORAL_TABLET | Freq: Every day | ORAL | Status: DC

## 2011-10-09 NOTE — Patient Instructions (Signed)
The patient is instructed to continue all medications as prescribed. Schedule followup with check out clerk upon leaving the clinic  

## 2011-10-09 NOTE — Progress Notes (Signed)
Subjective:    Patient ID: Miranda Orozco, female    DOB: [birth year], 51 y.o.   MRN: [id number]  HPI  Patient is a 51 year old female who presents for weight gain following diagnosis of breast cancer and a history of rheumatoid arthritis and complicated by her weight gain has been increased gastroesophageal reflux.  Her BMI is 33 which puts her at risk because of her weight.  We discussed protocols of using phentermine and Topamax and had initiated therapy with this combination she tolerated the combination well but due to the winter months had not been able to exercise or follow the diet as she had hoped stenosis attended therapy.  She presents today to reinitiate therapy  Review of Systems  Constitutional: Negative for activity change, appetite change and fatigue.  HENT: Negative for ear pain, congestion, neck pain, postnasal drip and sinus pressure.   Eyes: Negative for redness and visual disturbance.  Respiratory: Negative for cough, shortness of breath and wheezing.   Gastrointestinal: Negative for abdominal pain and abdominal distention.  Genitourinary: Negative for dysuria, frequency and menstrual problem.  Musculoskeletal: Negative for myalgias, joint swelling and arthralgias.  Skin: Negative for rash and wound.  Neurological: Negative for dizziness, weakness and headaches.  Hematological: Negative for adenopathy. Does not bruise/bleed easily.  Psychiatric/Behavioral: Negative for sleep disturbance and decreased concentration.   Past Medical History  Diagnosis Date  . MALIGNANT NEOPLASM OF BREAST UNSPECIFIED SITE 10/02/2009  . Hypoglycemia, unspecified 10/06/2009  . Esophageal reflux 10/02/2009  . OSTEOARTHRITIS 10/02/2009  . OSTEOPENIA 10/02/2009  . BREAST CANCER, HX OF 10/02/2009    History   Social History  . Marital Status: Married    Spouse Name: N/A    Number of Children: N/A  . Years of Education: N/A   Occupational History  . Not on file.   Social History Main  Topics  . Smoking status: Former Smoker -- 0.5 packs/day for 1 years    Types: Cigarettes    Quit date: 10/17/1981  . Smokeless tobacco: Never Used  . Alcohol Use: Yes  . Drug Use: Not on file  . Sexually Active: Not on file   Other Topics Concern  . Not on file   Social History Narrative  . No narrative on file    Past Surgical History  Procedure Date  . Cesarean section     1996, 2001  . Mastectomy partial / lumpectomy w/ axillary lymphadenectomy 03/12/2010    Right w [facility] Dr Jamey Ripa  . Breast lumpectomy 03/20/10    for margin  . Breast lumpectomy 03/26/2010    for margin  . Simple mastectomy 03/31/2011    For margin    Family History  Problem Relation Age of Onset  . Arthritis Other   . Diabetes Other   . Hypertension Other   . Hypertension Mother   . Hypertension Father   . Diabetes Father     No Known Allergies  Current Outpatient Prescriptions on File Prior to Visit  Medication Sig Dispense Refill  . tamoxifen (NOLVADEX) 20 MG tablet Take 20 mg by mouth daily.          BP 130/80  Pulse 76  Temp 98.2 F (36.8 C)  Resp 16  Ht 5\' 5"  (1.651 m)  Wt 198 lb (89.812 kg)  BMI 32.95 kg/m2       Objective:   Physical Exam  Nursing note and vitals reviewed. Constitutional: She is oriented to person, place, and time. She appears well-developed and well-nourished. No distress.  HENT:  Head: Normocephalic and atraumatic.  Right Ear: External ear normal.  Left Ear: External ear normal.  Nose: Nose normal.  Mouth/Throat: Oropharynx is clear and moist.  Eyes: Conjunctivae and EOM are normal. Pupils are equal, round, and reactive to light.  Neck: Normal range of motion. Neck supple. No JVD present. No tracheal deviation present. No thyromegaly present.  Cardiovascular: Normal rate, regular rhythm, normal heart sounds and intact distal pulses.   No murmur heard. Pulmonary/Chest: Effort normal and breath sounds normal. She has no wheezes. She exhibits no tenderness.   Abdominal: Soft. Bowel sounds are normal.  Musculoskeletal: Normal range of motion. She exhibits no edema and no tenderness.  Lymphadenopathy:    She has no cervical adenopathy.  Neurological: She is alert and oriented to person, place, and time. She has normal reflexes. No cranial nerve deficit.  Skin: Skin is warm and dry. She is not diaphoretic.  Psychiatric: She has a normal mood and affect. Her behavior is normal.          Assessment & Plan:  Discussed the impact of obesity on health and health problems.  Also discussed her breast cancer status.  Patient agrees that weight loss is key to her health.  Counseled for 30 minutes about weight loss.  Patient will resume the protocol and will followup in 3 months time with a careful record of her weight and diet

## 2011-11-18 ENCOUNTER — Encounter: Payer: Self-pay | Admitting: Obstetrics and Gynecology

## 2011-11-18 ENCOUNTER — Ambulatory Visit (INDEPENDENT_AMBULATORY_CARE_PROVIDER_SITE_OTHER): Payer: 59 | Admitting: Obstetrics and Gynecology

## 2011-11-18 ENCOUNTER — Ambulatory Visit: Payer: Self-pay | Admitting: Obstetrics and Gynecology

## 2011-11-18 VITALS — BP 116/78 | Ht 65.5 in | Wt 204.0 lb

## 2011-11-18 DIAGNOSIS — Z1211 Encounter for screening for malignant neoplasm of colon: Secondary | ICD-10-CM | POA: Insufficient documentation

## 2011-11-18 DIAGNOSIS — Z01419 Encounter for gynecological examination (general) (routine) without abnormal findings: Secondary | ICD-10-CM

## 2011-11-18 DIAGNOSIS — Z0000 Encounter for general adult medical examination without abnormal findings: Secondary | ICD-10-CM | POA: Insufficient documentation

## 2011-11-18 DIAGNOSIS — D259 Leiomyoma of uterus, unspecified: Secondary | ICD-10-CM

## 2011-11-18 DIAGNOSIS — R21 Rash and other nonspecific skin eruption: Secondary | ICD-10-CM | POA: Insufficient documentation

## 2011-11-18 NOTE — Patient Instructions (Signed)
We will call you with appts for Dermatology and GI referrals

## 2011-11-18 NOTE — Progress Notes (Signed)
Subjective:    Miranda Orozco is a 51 y.o. female  who presents for annual exam.  The patient has no complaints today.   The following portions of the patient's history were reviewed and updated as appropriate: allergies, current medications, past family history, past medical history, past social history, past surgical history and problem list.  Review of Systems Pertinent items are noted in HPI. Gastrointestinal:No change in bowel habits, no abdominal pain, no rectal bleeding Genitourinary:negative for dysuria, frequency, hematuria, nocturia and urinary incontinence    Objective:     BP 116/78  Ht 5' 5.5" (1.664 m)  Wt 204 lb (92.534 kg)  BMI 33.43 kg/m2  Weight:  Wt Readings from Last 1 Encounters:  11/18/11 204 lb (92.534 kg)     BMI: Body mass index is 33.43 kg/(m^2). General Appearance: Alert, appropriate appearance for age. No acute distress HEENT: Grossly normal Neck / Thyroid: Supple, no masses, nodes or enlargement Lungs: clear to auscultation bilaterally Back: No CVA tenderness Breast Exam: R is s/p mastectomy and recent biopsy of remaining breast tissue.  L is without masses Cardiovascular: Regular rate and rhythm. S1, S2, no murmur Gastrointestinal: Soft, non-tender, no masses or organomegaly Pelvic Exam: External genitalia: normal general appearance Vaginal: normal mucosa without prolapse or lesions Cervix: normal appearance Adnexa: no palpable masses Uterus: enlarged and upper limits of normal size, nontender Rectal: no masses Exam limited by body habitus Rectovaginal: not indicated and normal rectal, no masses Lymphatic Exam: Non-palpable nodes in neck, clavicular, axillary, or inguinal regions Skin: R forearm with erythematous healing ulcerated rash Neurologic: Normal gait and speech, no tremor  Psychiatric: Alert and oriented, appropriate affect.    Urinalysis:Not done      Assessment:    Known fibroids, now asymptomatic since amenorrheic after  chemotherapy  New R arm rash, ? Paraneoplastic vs dermatitis   Plan:    Pap smear.  HPV Vagifem prn GI referral for CRC screening Derm referral for rash on arm  Follow-up:  for annual exam

## 2011-11-20 LAB — PAP IG AND HPV HIGH-RISK: HPV DNA High Risk: NOT DETECTED

## 2011-12-16 ENCOUNTER — Ambulatory Visit: Payer: Self-pay | Admitting: Obstetrics and Gynecology

## 2012-01-09 ENCOUNTER — Ambulatory Visit: Payer: 59 | Admitting: Internal Medicine

## 2012-03-24 ENCOUNTER — Ambulatory Visit: Payer: 59 | Admitting: Internal Medicine

## 2012-04-24 ENCOUNTER — Other Ambulatory Visit: Payer: Self-pay | Admitting: Internal Medicine

## 2012-05-07 ENCOUNTER — Other Ambulatory Visit: Payer: 59 | Admitting: Lab

## 2012-05-14 ENCOUNTER — Ambulatory Visit: Payer: 59 | Admitting: Oncology

## 2012-06-15 ENCOUNTER — Other Ambulatory Visit: Payer: Self-pay | Admitting: Internal Medicine

## 2012-06-15 ENCOUNTER — Other Ambulatory Visit: Payer: Self-pay | Admitting: Nurse Practitioner

## 2012-06-15 MED ORDER — TAMOXIFEN CITRATE 20 MG PO TABS: 20.0000 mg | ORAL_TABLET | Freq: Every day | ORAL | Status: DC

## 2012-07-01 ENCOUNTER — Encounter (INDEPENDENT_AMBULATORY_CARE_PROVIDER_SITE_OTHER): Payer: Self-pay | Admitting: Surgery

## 2012-07-06 ENCOUNTER — Ambulatory Visit (HOSPITAL_BASED_OUTPATIENT_CLINIC_OR_DEPARTMENT_OTHER): Payer: 59 | Admitting: Oncology

## 2012-07-06 ENCOUNTER — Encounter: Payer: Self-pay | Admitting: Internal Medicine

## 2012-07-06 ENCOUNTER — Telehealth: Payer: Self-pay | Admitting: *Deleted

## 2012-07-06 VITALS — BP 149/94 | HR 84 | Temp 97.8°F | Resp 20 | Ht 65.5 in | Wt 210.5 lb

## 2012-07-06 DIAGNOSIS — C50419 Malignant neoplasm of upper-outer quadrant of unspecified female breast: Secondary | ICD-10-CM

## 2012-07-06 DIAGNOSIS — Z853 Personal history of malignant neoplasm of breast: Secondary | ICD-10-CM

## 2012-07-06 DIAGNOSIS — Z170 Estrogen receptor positive status [ER+]: Secondary | ICD-10-CM

## 2012-07-06 NOTE — Telephone Encounter (Signed)
to see dr Lina Sar, next available--pt needs a screening colonoscopy; see me OCT 2014, labs a week be  Gave patient the appointment for 08-11-2012 and 08-25-2012

## 2012-07-06 NOTE — Progress Notes (Signed)
ID: Miranda Orozco   DOB: [birth year]  MR#: [id number]  WUJ#:[id number]  PCP: Miranda Mew, MD GYN: Miranda Folk MD SU:  OTHER MD: Miranda Orozco   HISTORY OF PRESENT ILLNESS: Around November 2010 Miranda Orozco herself palpated what felt like a mass in the upper portion of her right breast. She thought this likely was connected with menstruation but when it persisted into January, she brought it to Dr. Lilian Orozco attention and was set up for mammography January 14th,.  Dr. Micheline Orozco confirmed a vague palpable focus over the upper outer right breast.  The mammogram was heterogeneously dense with mildly increased density in the upper outer right breast.  There were a few scattered microcalcifications that were not significantly changed.  There was no definite focal abnormality to correspond to the palpable abnormality.  Ultrasound showed a vague hypoechoic area at the 10 o'clock position 5 cm from the nipple, corresponding to the palpable abnormality.    Biopsy of this area was performed January 18th and showed ([id number]) an invasive ductal carcinoma which appeared low grade. There was no evidence of angiolymphatic invasion.  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  The prognostic profile showed the tumor to be ER positive at 84%, PR positive at 85% with a low proliferation marker at 11% and Her-2 negative by CISH with a ratio of 1.04.    The patient was referred to Dr. Donell Orozco and bilateral breast MRIs were obtained January 24th.  This showed a large area of patchy, non-mass like low grade enhancement in the upper outer quadrant of the right breast, extending up to 10.7 cm maximally.  There were no abnormal appearing lymph nodes and no other areas of enhancement suspicious for malignancy in either breast. The patient's subsequent history is as detailed below.  INTERVAL HISTORY: Miranda Orozco returns today for followup of her breast cancer. The interval history has been generally  unremarkable. She continues to work on the "Don't Be Dense" legislation and is now law being for a national requirements to be written into mammographic specifications.  REVIEW OF SYSTEMS: Today Miranda Orozco has been ever a hard day because her son's caregiver did not show this morning. She is tired, has slept poorly, and in that she feels explains why her blood pressure was elevated today. (It is not elevated she tells me when checked in other settings). What is really concerning her is an unusual pain which occurs in the right chest wall, quite deep, associated with laughter or coughing. Miranda Orozco has happened perhaps 10 or 15 times over the last 4 months. The pain is like a stabbing, and it lasts about a minute. It resolves with no intervention. It has no pleuritic component, is not positional, and of course she laughs and cough many times without the pain occurring. Aside from that she has noted some change in bowel habits namely moderate constipation, which is new for her. She is tolerating the tamoxifen better, although sometimes she feels it makes her arthritis worse, she takes a few days off, and then her arthritis symptoms abate. A detailed review of systems today was otherwise noncontributory.  PAST MEDICAL HISTORY: Past Medical History  Diagnosis Date  . MALIGNANT NEOPLASM OF BREAST UNSPECIFIED SITE 10/02/2009  . Hypoglycemia, unspecified 10/06/2009  . Esophageal reflux 10/02/2009  . OSTEOPENIA 10/02/2009  . BREAST CANCER, HX OF 10/02/2009  . Arthritis, rheumatic, acute or subacute 10/02/2009  Significant for a diagnosis of fibroadenoma made by left breast biopsy in June 2007, a history of C-section times two,  a history of surgery for significant left chest wall cellulitis apparently arising from a sebaceous cyst, history of degenerative disk disease and a history of rheumatoid arthritis, which originally presented several years ago with high fevers and pain in the hips to the extent that she was not able to walk.     PAST SURGICAL HISTORY: Past Surgical History  Procedure Date  . Cesarean section     1996, 2001  . Mastectomy partial / lumpectomy w/ axillary lymphadenectomy 03/12/2010    Right w [facility] Dr Jamey Ripa  . Breast lumpectomy 03/20/10    for margin  . Breast lumpectomy 03/26/2010    for margin  . Simple mastectomy 03/31/2011    For margin    FAMILY HISTORY Family History  Problem Relation Age of Onset  . Arthritis Other   . Diabetes Other   . Hypertension Other   . Hypertension Mother   . Hypertension Father   . Diabetes Father   The patient's father is alive with has a history of type 2 diabetes, hypertension, and hypercholesterolemia.  The patient's mother is alive, with a history of hypertension.  GYNECOLOGIC HISTORY: She is Gx, P3, first pregnancy age 25.  She was premenopausal at the time of her breast cancer diagnosis.  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  Her husband Miranda Orozco of course is our Sales promotion account executive at [employer].  Their children are Miranda Orozco who attended [employer], Miranda Orozco who is severely handicapped with an undiagnosed genetic syndrome (has been extensively evaluated) and essentially needs total care.  Miranda Orozco provided this themselves for many years, but more recently, they have been able to obtain help for about seven hours a day, which is a significant assist for the family].  [name] is Miranda Orozco.  The family attends [employer].   ADVANCED DIRECTIVES:  HEALTH MAINTENANCE: History  Substance Use Topics  . Smoking status: Former Smoker -- 0.5 packs/day for 1 years    Types: Cigarettes    Quit date: 10/17/1981  . Smokeless tobacco: Never Used  . Alcohol Use: 0.0 oz/week    2-3 Glasses of wine per week     Colonoscopy:  PAP:  Bone density:  Lipid panel:  No Known Allergies  Current Outpatient Prescriptions  Medication Sig Dispense Refill  . estradiol (VAGIFEM) 25 MCG vaginal tablet Place 10 mcg vaginally daily.      .  phentermine (ADIPEX-P) 37.5 MG tablet Take 1 tablet (37.5 mg total) by mouth daily before breakfast.  30 tablet  2  . phentermine 37.5 MG capsule Take 1 capsule (37.5 mg total) by mouth every morning. Takes on and off  30 capsule  3  . tamoxifen (NOLVADEX) 20 MG tablet Take 1 tablet (20 mg total) by mouth daily.  90 tablet  0    OBJECTIVE: Middle-aged white woman in no acute distress Filed Vitals:   07/06/12 1445  BP: 149/94  Pulse: 84  Temp: 97.8 F (36.6 C)  Resp: 20     Body mass index is 34.50 kg/(m^2).    ECOG FS: 0  Sclerae unicteric Oropharynx clear No cervical or supraclavicular adenopathy Lungs no rales or rhonchi Heart regular rate and rhythm Abd benign MSK no focal spinal tenderness, no peripheral edema Neuro: nonfocal Breasts: The right breast is status post mastectomy. Examination of the right chest wall and right axilla is entirely benign. There is no erythema or swelling. There is no tenderness to palpation including deep palpation of the rib cage in  this area. The left breast is unremarkable    LAB RESULTS: Lab Results  Component Value Date   WBC 5.3 09/05/2011   NEUTROABS 3.2 09/05/2011   HGB 12.7 09/05/2011   HCT 37.0 09/05/2011   MCV 89.8 09/05/2011   PLT 219 09/05/2011      Chemistry      Component Value Date/Time   NA 140 09/05/2011 1034   K 3.8 09/05/2011 1034   CL 103 09/05/2011 1034   CO2 27 09/05/2011 1034   BUN 14 09/05/2011 1034   CREATININE 0.68 09/05/2011 1034      Component Value Date/Time   CALCIUM 9.5 09/05/2011 1034   ALKPHOS 95 09/05/2011 1034   AST 23 09/05/2011 1034   ALT 18 09/05/2011 1034   BILITOT 0.3 09/05/2011 1034       Lab Results  Component Value Date   LABCA2 21 09/05/2011    No components found with this basename: [id number]    No results found for this basename: INR:1;PROTIME:1 in the last 168 hours  Urinalysis    Component Value Date/Time   LABSPEC 1.025 03/20/2011 1405   LABSPEC 1.010 12/31/2010 1121   PHURINE 5.0  12/31/2010 1121   GLUCOSEU 250* 12/31/2010 1121   HGBUR MODERATE* 12/31/2010 1121   BILIRUBINUR NEGATIVE 12/31/2010 1121   KETONESUR NEGATIVE 12/31/2010 1121   PROTEINUR 30* 12/31/2010 1121   UROBILINOGEN 1.0 12/31/2010 1121   NITRITE POSITIVE* 12/31/2010 1121   LEUKOCYTESUR LARGE Biochemical Testing Only. Please order routine urinalysis from main lab if confirmatory testing is needed.* 12/31/2010 1121    STUDIES: No results found. Repeat mammography is due February 2014   ASSESSMENT: 51 y.o. [place] woman status post right breast biopsy January of 2011 for a low-grade invasive ductal carcinoma which was strongly estrogen and progesterone receptor positive, HER-2 negative, with an MIB-1 of 18%, in the setting of extensive ductal carcinoma in situ;  (1) treated neoadjuvantly with doxorubicin/ cyclophosphamide in dose dense fashion x4 followed by weekly paclitaxel x12   (2) followed by lumpectomy and sentinel lymph node sampling 03/24/2010 showing no residual invasive disease but a 2.8 cm area of ductal carcinoma in situ, with positive margins.  All 4 sentinel lymph nodes were negative.  Margins could not be cleared despite repeated attempts and she underwent definitive mastectomy August of 2011.    (3) She completed post mastectomy radiation including the right supraclavicular area in November of 2011   (4) started tamoxifen November 2011  PLAN: She has completed 2 years of tamoxifen we certainly could switch to an aromatase inhibitor at this point. However I am not convinced that the arthralgias and myalgias she is experiencing are related to the tamoxifen. She has a diagnosis of rheumatoid arthritis and possibly fibromyalgia, and her symptoms are likely to wax and wane. Very likely if she did not stop the tamoxifen her symptoms would in any case revert back to baseline after a few days. We also discussed the fact that the low level of tamoxifen varies very little after only a few days' abstinence  (it takes approximately 4 months to completely clear 8 out of 1 system) so it is unlikely tamoxifen withdrawal for a few days is making her symptoms improve. We did discuss switching to an aromatase inhibitor, but they can cause precisely these symptoms, namely arthralgias and myalgias, as well as osteoporosis, all of which appropriately concerns her.  After much discussion we decided she would start a symptoms diary, and not stop the tamoxifen next time  she has a flare, but simply push through it and see if the symptoms clear despite the fact that tamoxifen is not interrupted. She will let me know how this goes, and she will also call after her mammogram in February. If her breasts have become less dense because of her anti-estrogen treatment, perhaps we will not have to do an MRI this year. Otherwise we will schedule an MRI around that time.  She has turned 51 and has a change in her bowel habits. I am referring her to Dr. Juanda Chance for screening colonoscopy. Otherwise added will return to see me in October of next year. She knows to call for any problems that may develop before that visit.  MAGRINAT,GUSTAV C    07/06/2012

## 2012-08-11 ENCOUNTER — Ambulatory Visit (AMBULATORY_SURGERY_CENTER): Payer: 59 | Admitting: *Deleted

## 2012-08-11 VITALS — Ht 65.0 in | Wt 210.0 lb

## 2012-08-11 DIAGNOSIS — Z1211 Encounter for screening for malignant neoplasm of colon: Secondary | ICD-10-CM

## 2012-08-11 MED ORDER — MOVIPREP 100 G PO SOLR: ORAL | Status: DC

## 2012-08-24 ENCOUNTER — Telehealth: Payer: Self-pay | Admitting: Internal Medicine

## 2012-08-24 ENCOUNTER — Telehealth: Payer: Self-pay | Admitting: *Deleted

## 2012-08-24 NOTE — Telephone Encounter (Signed)
Spoke with patient and she stated that she had a fever and a bad cough.   She states that she is blowing her nose, and that she is hot and cold. She wanted to cancel the procedure due to "the flu."

## 2012-08-25 ENCOUNTER — Encounter: Payer: 59 | Admitting: Internal Medicine

## 2012-09-15 NOTE — Telephone Encounter (Signed)
Another phone note was created for this

## 2012-09-28 ENCOUNTER — Other Ambulatory Visit: Payer: Self-pay | Admitting: Oncology

## 2012-09-28 DIAGNOSIS — Z853 Personal history of malignant neoplasm of breast: Secondary | ICD-10-CM

## 2012-12-29 LAB — HM PAP SMEAR: HM Pap smear: NORMAL

## 2013-02-10 ENCOUNTER — Encounter (HOSPITAL_COMMUNITY): Payer: Self-pay | Admitting: Pharmacist

## 2013-02-15 ENCOUNTER — Other Ambulatory Visit: Payer: Self-pay | Admitting: Obstetrics and Gynecology

## 2013-02-19 ENCOUNTER — Encounter (HOSPITAL_COMMUNITY): Payer: Self-pay | Admitting: *Deleted

## 2013-02-19 ENCOUNTER — Ambulatory Visit (HOSPITAL_COMMUNITY): Payer: 59 | Admitting: Anesthesiology

## 2013-02-19 ENCOUNTER — Encounter (HOSPITAL_COMMUNITY)
Admission: RE | Disposition: A | Discharge status: Home / Self Care | Payer: Self-pay | Source: Ambulatory Visit | Attending: Obstetrics and Gynecology

## 2013-02-19 ENCOUNTER — Ambulatory Visit (HOSPITAL_COMMUNITY)
Admission: RE | Admit: 2013-02-19 | Discharge: 2013-02-19 | Disposition: A | Discharge status: Home / Self Care | Payer: 59 | Source: Ambulatory Visit | Attending: Obstetrics and Gynecology | Admitting: Obstetrics and Gynecology

## 2013-02-19 ENCOUNTER — Encounter (HOSPITAL_COMMUNITY): Payer: Self-pay | Admitting: Anesthesiology

## 2013-02-19 DIAGNOSIS — N950 Postmenopausal bleeding: Secondary | ICD-10-CM | POA: Diagnosis present

## 2013-02-19 DIAGNOSIS — N9489 Other specified conditions associated with female genital organs and menstrual cycle: Secondary | ICD-10-CM | POA: Diagnosis present

## 2013-02-19 DIAGNOSIS — N840 Polyp of corpus uteri: Secondary | ICD-10-CM | POA: Diagnosis present

## 2013-02-19 HISTORY — PX: DILATATION & CURRETTAGE/HYSTEROSCOPY WITH RESECTOCOPE: SHX5572

## 2013-02-19 LAB — CBC
HCT: 37.4 % (ref 36.0–46.0)
Hemoglobin: 12.4 g/dL (ref 12.0–15.0)
MCHC: 33.2 g/dL (ref 30.0–36.0)
RBC: 4.19 MIL/uL (ref 3.87–5.11)
WBC: 8.6 10*3/uL (ref 4.0–10.5)

## 2013-02-19 SURGERY — DILATATION & CURETTAGE/HYSTEROSCOPY WITH RESECTOCOPE
Anesthesia: General | Site: Vagina | Wound class: Clean Contaminated

## 2013-02-19 MED ORDER — LIDOCAINE HCL (CARDIAC) 20 MG/ML IV SOLN
INTRAVENOUS | Status: AC
  Filled 2013-02-19: qty 5

## 2013-02-19 MED ORDER — PROPOFOL 10 MG/ML IV EMUL
INTRAVENOUS | Status: AC
  Filled 2013-02-19: qty 20

## 2013-02-19 MED ORDER — FENTANYL CITRATE 0.05 MG/ML IJ SOLN
INTRAMUSCULAR | Status: AC
  Filled 2013-02-19: qty 4

## 2013-02-19 MED ORDER — MEPERIDINE HCL 25 MG/ML IJ SOLN: 6.2500 mg | INTRAMUSCULAR | Status: DC | PRN

## 2013-02-19 MED ORDER — KETOROLAC TROMETHAMINE 30 MG/ML IJ SOLN
INTRAMUSCULAR | Status: AC
  Filled 2013-02-19: qty 1

## 2013-02-19 MED ORDER — FENTANYL CITRATE 0.05 MG/ML IJ SOLN
INTRAMUSCULAR | Status: DC | PRN
  Administered 2013-02-19 (×2): 50 ug via INTRAVENOUS

## 2013-02-19 MED ORDER — LIDOCAINE HCL 2 % IJ SOLN
INTRAMUSCULAR | Status: AC
  Filled 2013-02-19: qty 20

## 2013-02-19 MED ORDER — MIDAZOLAM HCL 2 MG/2ML IJ SOLN
INTRAMUSCULAR | Status: AC
  Administered 2013-02-19: 1 mg via INTRAVENOUS
  Filled 2013-02-19: qty 2

## 2013-02-19 MED ORDER — ONDANSETRON HCL 4 MG/2ML IJ SOLN: 4.0000 mg | Freq: Once | INTRAMUSCULAR | Status: DC | PRN

## 2013-02-19 MED ORDER — KETOROLAC TROMETHAMINE 30 MG/ML IJ SOLN
INTRAMUSCULAR | Status: DC | PRN
  Administered 2013-02-19: 60 mg via INTRAVENOUS

## 2013-02-19 MED ORDER — MIDAZOLAM HCL 2 MG/2ML IJ SOLN
INTRAMUSCULAR | Status: AC
  Filled 2013-02-19: qty 2

## 2013-02-19 MED ORDER — KETOROLAC TROMETHAMINE 30 MG/ML IJ SOLN: 15.0000 mg | Freq: Once | INTRAMUSCULAR | Status: DC | PRN

## 2013-02-19 MED ORDER — FENTANYL CITRATE 0.05 MG/ML IJ SOLN: 25.0000 ug | INTRAMUSCULAR | Status: DC | PRN

## 2013-02-19 MED ORDER — LACTATED RINGERS IV SOLN
INTRAVENOUS | Status: DC
Start: 2013-02-19 — End: 2013-02-19
  Administered 2013-02-19: 10:00:00 via INTRAVENOUS

## 2013-02-19 MED ORDER — MIDAZOLAM HCL 2 MG/2ML IJ SOLN
1.0000 mg | INTRAMUSCULAR | Status: DC | PRN
  Administered 2013-02-19: 1 mg via INTRAVENOUS

## 2013-02-19 MED ORDER — LIDOCAINE HCL 2 % IJ SOLN
INTRAMUSCULAR | Status: DC | PRN
  Administered 2013-02-19: 10 mL

## 2013-02-19 MED ORDER — IBUPROFEN 600 MG PO TABS: ORAL_TABLET | ORAL | Status: DC

## 2013-02-19 MED ORDER — ONDANSETRON HCL 4 MG/2ML IJ SOLN
INTRAMUSCULAR | Status: AC
  Filled 2013-02-19: qty 2

## 2013-02-19 MED ORDER — GLYCINE 1.5 % IR SOLN
Status: DC | PRN
  Administered 2013-02-19: 3000 mL

## 2013-02-19 MED ORDER — ONDANSETRON HCL 4 MG/2ML IJ SOLN
INTRAMUSCULAR | Status: DC | PRN
  Administered 2013-02-19: 4 mg via INTRAVENOUS

## 2013-02-19 MED ORDER — LIDOCAINE HCL (CARDIAC) 20 MG/ML IV SOLN
INTRAVENOUS | Status: DC | PRN
  Administered 2013-02-19: 40 mg via INTRAVENOUS

## 2013-02-19 SURGICAL SUPPLY — 25 items
BOOTIES KNEE HIGH SLOAN (MISCELLANEOUS) ×4 IMPLANT
CANISTER SUCTION 2500CC (MISCELLANEOUS) ×2 IMPLANT
CATH ROBINSON RED A/P 16FR (CATHETERS) ×2 IMPLANT
CLOTH BEACON ORANGE TIMEOUT ST (SAFETY) ×2 IMPLANT
CONTAINER PREFILL 10% NBF 60ML (FORM) ×4 IMPLANT
CORD ACTIVE DISPOSABLE (ELECTRODE) ×1
CORD ELECTRO ACTIVE DISP (ELECTRODE) ×1 IMPLANT
DILATOR CANAL MILEX (MISCELLANEOUS) IMPLANT
DRESSING TELFA 8X3 (GAUZE/BANDAGES/DRESSINGS) ×2 IMPLANT
ELECT LOOP GYNE PRO 24FR (CUTTING LOOP)
ELECT REM PT RETURN 9FT ADLT (ELECTROSURGICAL) ×2
ELECT VAPORTRODE GRVD BAR (ELECTRODE) IMPLANT
ELECTRODE LOOP GYNE PRO 24FR (CUTTING LOOP) IMPLANT
ELECTRODE REM PT RTRN 9FT ADLT (ELECTROSURGICAL) ×1 IMPLANT
ELECTRODE ROLLER VERSAPOINT (ELECTRODE) IMPLANT
ELECTRODE RT ANGLE VERSAPOINT (CUTTING LOOP) IMPLANT
ELECTRODE TWIZZLE TIP (MISCELLANEOUS) IMPLANT
GLOVE SURG SS PI 6.5 STRL IVOR (GLOVE) ×4 IMPLANT
GOWN STRL REIN XL XLG (GOWN DISPOSABLE) ×4 IMPLANT
LOOP ANGLED CUTTING 22FR (CUTTING LOOP) ×1 IMPLANT
PACK HYSTEROSCOPY LF (CUSTOM PROCEDURE TRAY) ×2 IMPLANT
PAD OB MATERNITY 4.3X12.25 (PERSONAL CARE ITEMS) ×2 IMPLANT
SUT CHROMIC 2 0 CT 1 (SUTURE) ×1 IMPLANT
TOWEL OR 17X24 6PK STRL BLUE (TOWEL DISPOSABLE) ×4 IMPLANT
WATER STERILE IRR 1000ML POUR (IV SOLUTION) ×2 IMPLANT

## 2013-02-19 NOTE — Op Note (Signed)
02/19/2013  11:44 AM  PATIENT:  Miranda Orozco  52 y.o. female  PRE-OPERATIVE DIAGNOSIS:  Post menopausal Bleeding; CPT Code 16109 - 45 minutes  POST-OPERATIVE DIAGNOSIS:  Post menopausal Bleeding  PROCEDURE:  Procedure(s): DILATATION & CURETTAGE/HYSTEROSCOPY WITH RESECTION OF POLYP  SURGEON:  Jamarl Pew P, MD  ASSISTANTS: none  ANESTHESIA:   general  ESTIMATED BLOOD LOSS: minimal  BLOOD ADMINISTERED:none  COMPLICATIONS:none FINDINGS: 2 endometrial polyps.  The largest measuring approximately 10 mm. The smaller measuring approximately 3 mm. The tubal ostia were visualized on either side. The remainder of the endometrium was atrophic.  FLUID DEFICIT: 40 cc  LOCAL MEDICATIONS USED:  XYLOCAINE  and Amount: 10 ml  SPECIMEN:  Source of Specimen:  endometrial polyps and endometrial curettings  DISPOSITION OF SPECIMEN:  PATHOLOGY  COUNTS:  YES  DESCRIPTION OF PROCEDURE:the patient was taken to the operating room after appropriate identification and placed on the operating table. After the attainment of adequate general anesthesia she was placed in the lithotomy position. The perineum and vagina were prepped with multiple layers of Betadine. The bladder was emptied with a an in and out catheter. The perineum was draped in sterile field. A gray speculum was placed in the vagina. The cervix was grasped with a single-tooth tenaculum. A paracervical block was achieved with a total of 10 cc of 2% Xylocaine and the 5 and 7:00 positions. The uterus was sounded to 7.5 cm  The cervix was then dilated to accommodate the diagnostic hysteroscope. The hysteroscope was used to evaluate all quadrants of the uterus with the above noted findings. The diagnostic hysteroscope was replaced with the operative hysteroscope and cautery used to excise each of the polyps at their bases. They were removed from the uterus and sent to pathology. The endometrial cavity was then curetted in all 4 quadrants with  minimal tissue obtained. All instruments were then removed from the vagina after a hemostatic suture was placed in the cervix at the site of the tenaculum. Tthe patient was awakened from general anesthesia and taken to the recovery room in satisfactory condition having tolerated the procedure well sponge and instrument counts correct.  PLAN OF CARE: Discharge home  PATIENT DISPOSITION:  PACU - hemodynamically stable.   Delay start of Pharmacological VTE agent (>24hrs) due to surgical blood loss or risk of bleeding:  SCD hose were used during the procedure.   Hal Morales, MD 11:44 AM

## 2013-02-19 NOTE — H&P (Signed)
Miranda Orozco is an 52 y.o. female. The patient is admitted for evaluation of postmenopausal bleeding with the finding of an endometrial mass most consistent with an endometrial polyp on outpatient sonohysterogram  Pertinent Gynecological History: Menses: post-menopausal Bleeding: post menopausal bleeding  started in April with about a week of bleeding like been seen. Intermittent spotting since then. No vaginal bleeding for at least the last week Last pap: normal Date: May of 2014   Menstrual History:  No LMP recorded. Patient is postmenopausal.    Past Medical History  Diagnosis Date  . MALIGNANT NEOPLASM OF BREAST UNSPECIFIED SITE 10/02/2009  . Hypoglycemia, unspecified 10/06/2009  . Esophageal reflux 10/02/2009  . OSTEOPENIA 10/02/2009  . BREAST CANCER, HX OF 10/02/2009  . Arthritis, rheumatic, acute or subacute 10/02/2009    Past Surgical History  Procedure Laterality Date  . Cesarean section      1996, 2001  . Mastectomy partial / lumpectomy w/ axillary lymphadenectomy  03/12/2010    Right w [facility] Dr Jamey Ripa  . Breast lumpectomy  03/20/10    for margin  . Breast lumpectomy  03/26/2010    for margin  . Simple mastectomy  03/31/2011    For margin    Family History  Problem Relation Age of Onset  . Arthritis Other   . Diabetes Other   . Hypertension Other   . Hypertension Mother   . Hypertension Father   . Diabetes Father   . Colon cancer Neg Hx   . Stomach cancer Neg Hx     Social History:  reports that she quit smoking about 31 years ago. Her smoking use included Cigarettes. She has a .5 pack-year smoking history. She has never used smokeless tobacco. She reports that  drinks alcohol. She reports that she does not use illicit drugs.  Allergies: No Known Allergies  Prescriptions prior to admission  Medication Sig Dispense Refill  . Multiple Vitamins-Minerals (MULTIVITAMIN WITH MINERALS) tablet Take 1 tablet by mouth daily.      . tamoxifen (NOLVADEX) 20 MG tablet TAKE 1  TABLET BY MOUTH DAILY.  90 tablet  2    Review of Systems  Constitutional: Negative.   HENT: Negative.   Eyes: Negative.   Respiratory: Negative.   Cardiovascular: Negative.   Gastrointestinal: Negative.   Genitourinary: Negative.   Musculoskeletal: Negative.   Skin: Negative.   Neurological: Negative.   Endo/Heme/Allergies: Negative.   Psychiatric/Behavioral: Negative.     Blood pressure 136/93, pulse 80, temperature 98.2 F (36.8 C), temperature source Oral, height 5\' 5"  (1.651 m), weight 210 lb (95.255 kg), SpO2 100.00%. Physical Exam  Constitutional: She is oriented to person, place, and time. She appears well-developed and well-nourished.  HENT:  Head: Normocephalic and atraumatic.  Eyes: Conjunctivae and EOM are normal.  Neck: Normal range of motion. Neck supple.  Cardiovascular: Normal rate and regular rhythm.   Respiratory: Effort normal and breath sounds normal.  GI: Soft.  Genitourinary: Vagina normal.  Uterus enlarged with known hx of fibroids  Musculoskeletal: Normal range of motion.  Neurological: She is alert and oriented to person, place, and time.  Skin: Skin is warm and dry.  Psychiatric: She has a normal mood and affect.  s/p right mastectomy without residual masses  Results for orders placed during the hospital encounter of 02/19/13 (from the past 24 hour(s))  CBC     Status: None   Collection Time    02/19/13  9:26 AM      Result Value Range   WBC  8.6  4.0 - 10.5 K/uL   RBC 4.19  3.87 - 5.11 MIL/uL   Hemoglobin 12.4  12.0 - 15.0 g/dL   HCT 57.8  46.9 - 62.9 %   MCV 89.3  78.0 - 100.0 fL   MCH 29.6  26.0 - 34.0 pg   MCHC 33.2  30.0 - 36.0 g/dL   RDW 52.8  41.3 - 24.4 %   Platelets 227  150 - 400 K/uL     Assessment/Plan: PMB in pt on Tamoxifen Endometrial mass, probable endometrial polyp  Hysteroscopy with resection of endometrial mass and uterine curettage recommended and accepted. The risks of anesthesia, bleeding, infection, damage to  adjacent organs and uterine perforation, have all been reviewed. The patient wishes to proceed.  Edmund Holcomb P 02/19/2013, 10:42 AM

## 2013-02-19 NOTE — Transfer of Care (Signed)
Immediate Anesthesia Transfer of Care Note  Patient: Miranda Orozco  Procedure(s) Performed: Procedure(s): DILATATION & CURETTAGE/HYSTEROSCOPY WITH RESECTION OF POLYP (N/A)  Patient Location: PACU  Anesthesia Type:General  Level of Consciousness: awake, alert  and patient cooperative  Airway & Oxygen Therapy: Patient Spontanous Breathing and Patient connected to nasal cannula oxygen  Post-op Assessment: Report given to PACU RN and Post -op Vital signs reviewed and stable  Post vital signs: Reviewed and stable  Complications: No apparent anesthesia complications

## 2013-02-19 NOTE — Anesthesia Postprocedure Evaluation (Signed)
Anesthesia Post Note  Patient: Miranda Orozco  Procedure(s) Performed: Procedure(s) (LRB): DILATATION & CURETTAGE/HYSTEROSCOPY WITH RESECTION OF POLYP (N/A)  Anesthesia type: General  Patient location: PACU  Post pain: Pain level controlled  Post assessment: Post-op Vital signs reviewed  Last Vitals:  Filed Vitals:   02/19/13 1215  BP: 126/72  Pulse: 70  Temp:   Resp: 16    Post vital signs: Reviewed  Level of consciousness: sedated  Complications: No apparent anesthesia complications

## 2013-02-19 NOTE — Anesthesia Procedure Notes (Signed)
Epidural Patient location during procedure: OB  Preanesthetic Checklist Completed: patient identified, surgical consent, pre-op evaluation, timeout performed, IV checked, risks and benefits discussed and monitors and equipment checked  Epidural Patient position: sitting Prep: site prepped and draped and DuraPrep Patient monitoring: continuous pulse ox and blood pressure Approach: midline Injection technique: LOR air  Needle:  Needle type: Tuohy  Needle gauge: 17 G Needle length: 9 cm and 9 Needle insertion depth: 5 cm cm Catheter type: closed end flexible Catheter size: 19 Gauge Catheter at skin depth: 10 cm Test dose: negative  Assessment Events: blood not aspirated, injection not painful, no injection resistance, negative IV test and no paresthesia  Additional Notes Reason for block:procedure for pain   

## 2013-02-19 NOTE — Anesthesia Preprocedure Evaluation (Addendum)
Anesthesia Evaluation  Patient identified by MRN, date of birth, ID band Patient awake    Reviewed: Allergy & Precautions, H&P , NPO status , Patient's Chart, lab work & pertinent test results  Airway Mallampati: II TM Distance: >3 FB Neck ROM: full    Dental no notable dental hx. (+) Teeth Intact   Pulmonary neg pulmonary ROS,    Pulmonary exam normal       Cardiovascular negative cardio ROS      Neuro/Psych negative neurological ROS  negative psych ROS   GI/Hepatic Neg liver ROS,   Endo/Other  negative endocrine ROS  Renal/GU negative Renal ROS  negative genitourinary   Musculoskeletal negative musculoskeletal ROS (+)   Abdominal Normal abdominal exam  (+)   Peds negative pediatric ROS (+)  Hematology negative hematology ROS (+)   Anesthesia Other Findings   Reproductive/Obstetrics negative OB ROS                           Anesthesia Physical Anesthesia Plan  ASA: II  Anesthesia Plan: General   Post-op Pain Management:    Induction: Intravenous  Airway Management Planned: LMA  Additional Equipment:   Intra-op Plan:   Post-operative Plan:   Informed Consent: I have reviewed the patients History and Physical, chart, labs and discussed the procedure including the risks, benefits and alternatives for the proposed anesthesia with the patient or authorized representative who has indicated his/her understanding and acceptance.     Plan Discussed with: CRNA and Surgeon  Anesthesia Plan Comments:         Anesthesia Quick Evaluation

## 2013-02-22 ENCOUNTER — Encounter (HOSPITAL_COMMUNITY): Payer: Self-pay | Admitting: Obstetrics and Gynecology

## 2013-04-07 ENCOUNTER — Encounter: Payer: Self-pay | Admitting: Internal Medicine

## 2013-04-29 ENCOUNTER — Other Ambulatory Visit: Payer: 59 | Admitting: Lab

## 2013-05-05 ENCOUNTER — Telehealth: Payer: Self-pay | Admitting: Oncology

## 2013-05-05 NOTE — Telephone Encounter (Signed)
Returned pt's call re message she left asking to please call her. Not able to reach pt or lm (vm full).

## 2013-05-06 ENCOUNTER — Telehealth: Payer: Self-pay | Admitting: Oncology

## 2013-05-06 ENCOUNTER — Ambulatory Visit (HOSPITAL_BASED_OUTPATIENT_CLINIC_OR_DEPARTMENT_OTHER): Payer: 59 | Admitting: Oncology

## 2013-05-06 ENCOUNTER — Ambulatory Visit (HOSPITAL_BASED_OUTPATIENT_CLINIC_OR_DEPARTMENT_OTHER): Payer: 59 | Admitting: Lab

## 2013-05-06 VITALS — BP 132/84 | HR 89 | Temp 98.2°F | Resp 20 | Ht 65.0 in | Wt 195.9 lb

## 2013-05-06 DIAGNOSIS — C50919 Malignant neoplasm of unspecified site of unspecified female breast: Secondary | ICD-10-CM

## 2013-05-06 DIAGNOSIS — C50419 Malignant neoplasm of upper-outer quadrant of unspecified female breast: Secondary | ICD-10-CM

## 2013-05-06 DIAGNOSIS — Z170 Estrogen receptor positive status [ER+]: Secondary | ICD-10-CM

## 2013-05-06 DIAGNOSIS — C50411 Malignant neoplasm of upper-outer quadrant of right female breast: Secondary | ICD-10-CM

## 2013-05-06 LAB — CBC WITH DIFFERENTIAL/PLATELET
Basophils Absolute: 0 10*3/uL (ref 0.0–0.1)
Eosinophils Absolute: 0.1 10*3/uL (ref 0.0–0.5)
HCT: 34.9 % (ref 34.8–46.6)
HGB: 11.7 g/dL (ref 11.6–15.9)
LYMPH%: 25.8 % (ref 14.0–49.7)
MCV: 87.4 fL (ref 79.5–101.0)
MONO#: 0.5 10*3/uL (ref 0.1–0.9)
NEUT#: 4.4 10*3/uL (ref 1.5–6.5)
Platelets: 218 10*3/uL (ref 145–400)
RBC: 4 10*6/uL (ref 3.70–5.45)
WBC: 6.8 10*3/uL (ref 3.9–10.3)

## 2013-05-06 LAB — COMPREHENSIVE METABOLIC PANEL (CC13)
Albumin: 3.9 g/dL (ref 3.5–5.0)
BUN: 11.2 mg/dL (ref 7.0–26.0)
CO2: 26 mEq/L (ref 22–29)
Glucose: 104 mg/dl (ref 70–140)
Potassium: 3.6 mEq/L (ref 3.5–5.1)
Sodium: 142 mEq/L (ref 136–145)
Total Bilirubin: <0.2 mg/dL (ref 0.20–1.20)
Total Protein: 7.9 g/dL (ref 6.4–8.3)

## 2013-05-06 NOTE — Progress Notes (Signed)
ID: [name]   DOB: 01/03/1961  MR#: [id number]  AOZ#:[id number]  PCP: Carrie Mew, MD GYN: Dierdre Forth MD SU:  OTHER MD: Pollyann Savoy, Lina Sar, Venancio Poisson   HISTORY OF PRESENT ILLNESS: Around November 2010 Katrece herself palpated what felt like a mass in the upper portion of her right breast. She thought this likely was connected with menstruation but when it persisted into January, she brought it to Dr. Lilian Coma attention and was set up for mammography January 14th,.  Dr. Micheline Maze confirmed a vague palpable focus over the upper outer right breast.  The mammogram was heterogeneously dense with mildly increased density in the upper outer right breast.  There were a few scattered microcalcifications that were not significantly changed.  There was no definite focal abnormality to correspond to the palpable abnormality.  Ultrasound showed a vague hypoechoic area at the 10 o'clock position 5 cm from the nipple, corresponding to the palpable abnormality.    Biopsy of this area was performed January 18th and showed ([id number]) an invasive ductal carcinoma which appeared low grade. There was no evidence of angiolymphatic invasion.  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  The prognostic profile showed the tumor to be ER positive at 84%, PR positive at 85% with a low proliferation marker at 11% and Her-2 negative by CISH with a ratio of 1.04.    The patient was referred to Dr. Donell Beers and bilateral breast MRIs were obtained January 24th.  This showed a large area of patchy, non-mass like low grade enhancement in the upper outer quadrant of the right breast, extending up to 10.7 cm maximally.  There were no abnormal appearing lymph nodes and no other areas of enhancement suspicious for malignancy in either breast. The patient's subsequent history is as detailed below.  INTERVAL HISTORY: Jakeia returns today for followup of her breast cancer. The interval history is  unremarkable. 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 She recently he went on a diet and lost 25 pounds. She is a member of "[employer]" although currently she is not exercising regularly. Family is doing fine.  REVIEW OF SYSTEMS: Adding noted some postmenopausal bleeding and brought it to her gynecologist attention. She had a hysteroscopy and biopsy in July which was benign (scanned below). There has been no further bleeding issues. She is tolerating the tamoxifen with no other significant side effects and in particular no hot flashes. She has mild vaginal dryness and is aware of the fact that while on tamoxifen it is okay to use vaginal estrogens although in fact she is not doing that, though she has the prescription. She has developed a bit of a rash which comes and goes, currently mostly gone, but she has not sought dermatologic evaluation for this. Sometimes when she coughs or labs really hard she gets a sharp pain in the right flank area which lasts 30-40 seconds. This is obviously very intermittent. Otherwise a detailed review of systems is noncontributory  PAST MEDICAL HISTORY: Past Medical History  Diagnosis Date  . MALIGNANT NEOPLASM OF BREAST UNSPECIFIED SITE 10/02/2009  . Hypoglycemia, unspecified 10/06/2009  . Esophageal reflux 10/02/2009  . OSTEOPENIA 10/02/2009  . BREAST CANCER, HX OF 10/02/2009  . Arthritis, rheumatic, acute or subacute 10/02/2009  Significant for a diagnosis of fibroadenoma made by left breast biopsy in June 2007, a history of C-section times two, a history of surgery for significant left chest wall cellulitis apparently arising from  a sebaceous cyst, history of degenerative disk disease and a history of rheumatoid arthritis  PAST SURGICAL HISTORY: Past Surgical History  Procedure Laterality Date  . Cesarean section      1996, 2001  . Mastectomy partial / lumpectomy w/ axillary lymphadenectomy   03/12/2010    Right w [facility] Dr Jamey Ripa  . Breast lumpectomy  03/20/10    for margin  . Breast lumpectomy  03/26/2010    for margin  . Simple mastectomy  03/31/2011    For margin  . Dilatation & currettage/hysteroscopy with resectocope N/A 02/19/2013    Procedure: DILATATION & CURETTAGE/HYSTEROSCOPY WITH RESECTION OF POLYP;  Surgeon: Hal Morales, MD;  Location: [facility];  Service: Gynecology;  Laterality: N/A;    FAMILY HISTORY Family History  Problem Relation Age of Onset  . Arthritis Other   . Diabetes Other   . Hypertension Other   . Hypertension Mother   . Hypertension Father   . Diabetes Father   . Colon cancer Neg Hx   . Stomach cancer Neg Hx   The patient's father is alive with has a history of type 2 diabetes, hypertension, and hypercholesterolemia.  The patient's mother is alive, with a history of hypertension.  GYNECOLOGIC HISTORY: She is Gx, P3, first pregnancy age 76.  She was premenopausal at the time of her breast cancer diagnosis.  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  Her husband [name] of course is our Sales promotion account executive at [employer].  Their children are Adell is working as a Geneticist, molecular who is handicapped with an undiagnosed genetic syndrome (has been extensively evaluated) and requires total care.  [name] provided this themselves for many years, but more recently, they have been able to obtain help for about seven hours a day, which is a significant assist for the family].  [name] is Hospital doctor.  The family attends [employer].   ADVANCED DIRECTIVES:  HEALTH MAINTENANCE: History  Substance Use Topics  . Smoking status: Former Smoker -- 0.50 packs/day for 1 years    Types: Cigarettes    Quit date: 10/17/1981  . Smokeless tobacco: Never Used  . Alcohol Use: 0.0 oz/week    2-3 Glasses of wine per week     Colonoscopy: Due  PAP: Up-to-date Bone density: 04/02/2013, normal  Lipid panel:  No Known Allergies  Current  Outpatient Prescriptions  Medication Sig Dispense Refill  . ibuprofen (ADVIL,MOTRIN) 600 MG tablet Ibuprofen 600 mg orally every 6 hours for 3  days, then every 6 hours as needed. For pain  60 tablet  2  . Multiple Vitamins-Minerals (MULTIVITAMIN WITH MINERALS) tablet Take 1 tablet by mouth daily.      . tamoxifen (NOLVADEX) 20 MG tablet TAKE 1 TABLET BY MOUTH DAILY.  90 tablet  2   No current facility-administered medications for this visit.    OBJECTIVE: Middle-aged white woman in no acute distress Filed Vitals:   05/06/13 1451  BP: 132/84  Pulse: 89  Temp: 98.2 F (36.8 C)  Resp: 20     Body mass index is 32.6 kg/(m^2).    ECOG FS: 0  Sclerae unicteric Oropharynx clear No cervical or supraclavicular adenopathy Lungs no rales or rhonchi Heart regular rate and rhythm Abd soft, nontender, positive bowel sounds MSK no focal spinal tenderness, no peripheral edema Neuro: nonfocal, well-oriented, positive affect Breasts: The right breast is status post mastectomy. Examination of the right chest wall and right axilla is benign. There is no tenderness  to palpation including deep palpation of the rib cage in the right flank area. The right axilla is benign The left breast is unremarkable    LAB RESULTS: Lab Results  Component Value Date   WBC 6.8 05/06/2013   NEUTROABS 4.4 05/06/2013   HGB 11.7 05/06/2013   HCT 34.9 05/06/2013   MCV 87.4 05/06/2013   PLT 218 05/06/2013      Chemistry      Component Value Date/Time   NA 142 05/06/2013 1532   NA 140 09/05/2011 1034   K 3.6 05/06/2013 1532   K 3.8 09/05/2011 1034   CL 103 09/05/2011 1034   CO2 26 05/06/2013 1532   CO2 27 09/05/2011 1034   BUN 11.2 05/06/2013 1532   BUN 14 09/05/2011 1034   CREATININE 0.8 05/06/2013 1532   CREATININE 0.68 09/05/2011 1034      Component Value Date/Time   CALCIUM 9.4 05/06/2013 1532   CALCIUM 9.5 09/05/2011 1034   ALKPHOS 82 05/06/2013 1532   ALKPHOS 95 09/05/2011 1034   AST 24 05/06/2013 1532   AST 23  09/05/2011 1034   ALT 24 05/06/2013 1532   ALT 18 09/05/2011 1034   BILITOT <0.20 05/06/2013 1532   BILITOT 0.3 09/05/2011 1034       Lab Results  Component Value Date   LABCA2 21 09/05/2011    No components found with this basename: [id number]    No results found for this basename: INR,  in the last 168 hours  Urinalysis    Component Value Date/Time   LABSPEC 1.025 03/20/2011 1405   LABSPEC 1.010 12/31/2010 1121   PHURINE 5.0 12/31/2010 1121   GLUCOSEU 250* 12/31/2010 1121   HGBUR MODERATE* 12/31/2010 1121   BILIRUBINUR NEGATIVE 12/31/2010 1121   KETONESUR NEGATIVE 12/31/2010 1121   PROTEINUR 30* 12/31/2010 1121   UROBILINOGEN 1.0 12/31/2010 1121   NITRITE POSITIVE* 12/31/2010 1121   LEUKOCYTESUR LARGE Biochemical Testing Only. Please order routine urinalysis from main lab if confirmatory testing is needed.* 12/31/2010 1121    STUDIES: Left Mammography August 2014 showed no evidence of recurrent disease  Patient: Palla, Judianne Collected: 02/19/2013 Client: [facility] Accession: [id number] Received: 02/19/2013 Dierdre Forth DOB: Dec 03, 1960 Age: 89 Gender: F Reported: 02/22/2013 [address] Patient Ph: [phone] MRN #: [id number] [place], [place] [address] Visit #: [id number] Chart #: Phone: [phone] Fax: CC: REPORT OF SURGICAL PATHOLOGY FINAL DIAGNOSIS Diagnosis 1. Endometrial polyp - BENIGN LOWER UTERINE SEGMENT / ENDOCERVICAL POLYP; NEGATIVE FOR ATYPIA OR MALIGNANCY. 2. Endometrium, curettage - FRAGMENTS OF INACTIVE / ATROPHIC ENDOMETRIUM; NEGATIVE FOR HYPERPLASIA OR MALIGNANCY. - BENIGN ENDOCERVICAL MUCOSA. - DETACHED FRAGMENTS OF SQUAMOUS EPITHELIUM; NEGATIVE FOR INTRAEPITHELIAL LESION OR MALIGNANCY. [place] RUND DO Pathologist, Electronic Signature (Case signed 02/22/2013) Specimen Gross and Clinical Information Specimen(s) Obtained: 1. Endometrial polyp 2. Endometrium, curettage Specimen Clinical Information 1. post menopausal bleeding (kp) Gross 1.  Received in formalin are irregular to polypoid tan-red mucosal fragments which have an aggregate measurement of 2 x 1.5 x 0.5 cm. The specimen is sectioned and entirely submitted in one cassette 2. Received in formalin are 0.7 x 0.5 x 0.2 cm of soft tan-pink tissue and bloody mucus. The specimen is entirely submitted in one cassette. (GRP:ecj 03/01/2013)   ASSESSMENT: 52 y.o. [place] woman status post right breast biopsy January of 2011 for a low-grade invasive ductal carcinoma which was strongly estrogen and progesterone receptor positive, HER-2 negative, with an MIB-1 of 18%, in the setting of extensive ductal carcinoma in situ;  (1) treated  neoadjuvantly with doxorubicin/ cyclophosphamide in dose dense fashion x4 followed by weekly paclitaxel x12   (2) followed by lumpectomy and sentinel lymph node sampling 03/24/2010 showing no residual invasive disease but a 2.8 cm area of ductal carcinoma in situ, with positive margins.  All 4 sentinel lymph nodes were negative.  Margins could not be cleared despite repeated attempts and she underwent definitive mastectomy August of 2011.    (3) She completed post mastectomy radiation including the right supraclavicular area in November of 2011   (4) started tamoxifen November 2011  PLAN: She could switch from tamoxifen to anastrozole if she was particularly concerned about the endometrial lining/bleeding problem but she prefers to continue on tamoxifen and that means we will likely do that for 10 years. While on tamoxifen it is fine for her to use local estrogen preparation for vaginal health and she has discussed this with her gynecologist. She missed her colonoscopy appointment earlier this year because she was ill at that time, so we are making that again.  The skin rash I could see today looked more acneform than anything else. I do think it warrants evaluation she doesn't have a dermatologist so I am referring her to [facility] for this. Otherwise Sharmon is  doing terrific, with no evidence of disease recurrence. She is going to see me again a year from now. She knows to call for any problems that may develop before the next visit here.  Analese Sovine C    05/06/2013

## 2013-05-07 LAB — VITAMIN D 25 HYDROXY (VIT D DEFICIENCY, FRACTURES): Vit D, 25-Hydroxy: 42 ng/mL (ref 30–89)

## 2013-05-17 ENCOUNTER — Ambulatory Visit (INDEPENDENT_AMBULATORY_CARE_PROVIDER_SITE_OTHER): Payer: 59

## 2013-05-17 DIAGNOSIS — Z23 Encounter for immunization: Secondary | ICD-10-CM

## 2013-08-10 ENCOUNTER — Other Ambulatory Visit: Payer: Self-pay | Admitting: *Deleted

## 2013-08-10 ENCOUNTER — Telehealth: Payer: Self-pay | Admitting: Internal Medicine

## 2013-08-10 MED ORDER — OSELTAMIVIR PHOSPHATE 75 MG PO CAPS: 75.0000 mg | ORAL_CAPSULE | Freq: Two times a day (BID) | ORAL | Status: DC

## 2013-08-10 NOTE — Telephone Encounter (Signed)
Pt needs a RX for Tamaflu, states she is having flu symptoms.  Please advise. States she knows it the flu, her husband has it.

## 2013-08-10 NOTE — Telephone Encounter (Signed)
Myalgia, fever, chills since yesterday and husband has documented flu.  May h ave tamiflu per dr [name]

## 2013-09-10 ENCOUNTER — Encounter: Payer: Self-pay | Admitting: Internal Medicine

## 2013-09-10 ENCOUNTER — Ambulatory Visit (INDEPENDENT_AMBULATORY_CARE_PROVIDER_SITE_OTHER): Payer: 59 | Admitting: Internal Medicine

## 2013-09-10 VITALS — BP 120/80 | HR 72 | Temp 98.0°F | Resp 16 | Ht 60.0 in | Wt 199.0 lb

## 2013-09-10 DIAGNOSIS — R21 Rash and other nonspecific skin eruption: Secondary | ICD-10-CM

## 2013-09-10 DIAGNOSIS — Z0000 Encounter for general adult medical examination without abnormal findings: Secondary | ICD-10-CM

## 2013-09-10 LAB — POCT URINALYSIS DIPSTICK
Bilirubin, UA: NEGATIVE
Glucose, UA: NEGATIVE
KETONES UA: NEGATIVE
Nitrite, UA: NEGATIVE
PROTEIN UA: NEGATIVE
SPEC GRAV UA: 1.02
UROBILINOGEN UA: 0.2
pH, UA: 7

## 2013-09-10 LAB — CBC WITH DIFFERENTIAL/PLATELET
BASOS ABS: 0 10*3/uL (ref 0.0–0.1)
Basophils Relative: 0.3 % (ref 0.0–3.0)
Eosinophils Absolute: 0.3 10*3/uL (ref 0.0–0.7)
Eosinophils Relative: 4.2 % (ref 0.0–5.0)
HEMATOCRIT: 37.2 % (ref 36.0–46.0)
HEMOGLOBIN: 12.4 g/dL (ref 12.0–15.0)
LYMPHS ABS: 1.7 10*3/uL (ref 0.7–4.0)
Lymphocytes Relative: 22.3 % (ref 12.0–46.0)
MCHC: 33.2 g/dL (ref 30.0–36.0)
MCV: 89.7 fl (ref 78.0–100.0)
MONO ABS: 0.6 10*3/uL (ref 0.1–1.0)
Monocytes Relative: 8.6 % (ref 3.0–12.0)
NEUTROS ABS: 4.8 10*3/uL (ref 1.4–7.7)
Neutrophils Relative %: 64.6 % (ref 43.0–77.0)
PLATELETS: 253 10*3/uL (ref 150.0–400.0)
RBC: 4.14 Mil/uL (ref 3.87–5.11)
RDW: 14 % (ref 11.5–14.6)
WBC: 7.5 10*3/uL (ref 4.5–10.5)

## 2013-09-10 MED ORDER — DESONIDE 0.05 % EX LOTN: TOPICAL_LOTION | Freq: Two times a day (BID) | CUTANEOUS | Status: DC

## 2013-09-10 NOTE — Patient Instructions (Signed)
The patient is instructed to continue all medications as prescribed. Schedule followup with check out clerk upon leaving the clinic  

## 2013-09-10 NOTE — Progress Notes (Signed)
   Subjective:    Patient ID: Miranda Orozco, female    DOB: July 10, 1961, 53 y.o.   MRN: [id number]  HPI Has a itching rash on forearms for 3-4 months "dry skin" hx but this is "hives" like and OTC creams and benadryl have not helped Limited to the forearms CPX  And blood work due On Tamoxifen since 2012...   3 + year Receptor positive breat cancer     Review of Systems  Constitutional: Negative.   HENT: Negative.   Eyes: Negative.   Respiratory: Negative.   Gastrointestinal: Negative.   Endocrine: Negative.   Genitourinary: Negative.   Musculoskeletal: Negative.   Skin: Positive for color change and rash.  Hematological: Negative.   Psychiatric/Behavioral: The patient is nervous/anxious.        Objective:   Physical Exam  Nursing note and vitals reviewed. Constitutional: She appears well-developed and well-nourished.  HENT:  Head: Normocephalic and atraumatic.  Eyes: Conjunctivae are normal. Pupils are equal, round, and reactive to light.  Neck: Normal range of motion. Neck supple.  Cardiovascular: Normal rate and regular rhythm.   Pulmonary/Chest: Effort normal.  Abdominal: Soft. Bowel sounds are normal.  Musculoskeletal: Normal range of motion. She exhibits tenderness.  Neurological: She is alert. She displays normal reflexes. No cranial nerve deficit. Coordination normal.  Skin: Rash noted. There is erythema.  Macular papular blanching rash most prominent on the distal part of the upper extremities spreading to the mid biceps area          Assessment & Plan:   This is a routine physical examination for this healthy  Female. Reviewed all health maintenance protocols including mammography colonoscopy bone density and reviewed appropriate screening labs. Her immunization history was reviewed as well as her current medications and allergies refills of her chronic medications were given and the plan for yearly health maintenance was discussed all orders and referrals  were made as appropriate.   Probable allergic rash on the upper extremities.  We will test for eosinophils in the broad we'll also look at her allergy profile I will give her a Dosepak and a cream to control the itching and rash

## 2013-09-10 NOTE — Progress Notes (Signed)
Pre visit review using our clinic review tool, if applicable. No additional management support is needed unless otherwise documented below in the visit note. 

## 2013-09-11 LAB — VITAMIN D 25 HYDROXY (VIT D DEFICIENCY, FRACTURES): Vit D, 25-Hydroxy: 27 ng/mL — ABNORMAL LOW (ref 30–89)

## 2013-09-13 LAB — ~~LOC~~ ALLERGY PANEL
Allergen, D pternoyssinus,d7: 1.57 kU/L — ABNORMAL HIGH
Aspergillus fumigatus, m3: <0.1 kU/L
Bermuda Grass: <0.1 kU/L
Box Elder IgE: <0.1 kU/L
CAT DANDER: 21.2 kU/L — AB
Cockroach: <0.1 kU/L
Common Ragweed: <0.1 kU/L
D. farinae: 1.17 kU/L — ABNORMAL HIGH
Dog Dander: >100 kU/L — ABNORMAL HIGH
Elm IgE: <0.1 kU/L
Mucor Racemosus: <0.1 kU/L
Pecan/Hickory Tree IgE: <0.1 kU/L
Penicillium Notatum: <0.1 kU/L
Rough Pigweed  IgE: <0.1 kU/L
Stemphylium Botryosum: <0.1 kU/L
Sweet Gum: <0.1 kU/L

## 2013-09-13 LAB — LIPID PANEL
CHOLESTEROL: 216 mg/dL — AB (ref 0–200)
HDL: 47.3 mg/dL (ref >39.00)
TRIGLYCERIDES: 212 mg/dL — AB (ref 0.0–149.0)
Total CHOL/HDL Ratio: 5
VLDL: 42.4 mg/dL — ABNORMAL HIGH (ref 0.0–40.0)

## 2013-09-13 LAB — BASIC METABOLIC PANEL
BUN: 16 mg/dL (ref 6–23)
CHLORIDE: 101 meq/L (ref 96–112)
CO2: 28 meq/L (ref 19–32)
CREATININE: 0.8 mg/dL (ref 0.4–1.2)
Calcium: 9.3 mg/dL (ref 8.4–10.5)
GFR: 86.23 mL/min (ref >60.00)
GLUCOSE: 81 mg/dL (ref 70–99)
Potassium: 4.1 mEq/L (ref 3.5–5.1)
Sodium: 137 mEq/L (ref 135–145)

## 2013-09-13 LAB — HEPATIC FUNCTION PANEL
ALBUMIN: 4.3 g/dL (ref 3.5–5.2)
ALT: 29 U/L (ref 0–35)
AST: 28 U/L (ref 0–37)
Alkaline Phosphatase: 80 U/L (ref 39–117)
BILIRUBIN TOTAL: 0.4 mg/dL (ref 0.3–1.2)
Bilirubin, Direct: 0 mg/dL (ref 0.0–0.3)
TOTAL PROTEIN: 7.9 g/dL (ref 6.0–8.3)

## 2013-09-13 LAB — TSH: TSH: 1.94 u[IU]/mL (ref 0.35–5.50)

## 2013-09-13 LAB — LDL CHOLESTEROL, DIRECT: LDL DIRECT: 130.6 mg/dL

## 2013-09-15 LAB — IGG FOOD PANEL
ALLERGEN EGG WHITE IGG: 6.5 ug/mL — AB (ref <2.0)
ALLERGEN, MILK, IGG: 14.2 ug/mL — AB (ref <0.15)
Beef, IgG: 6.4 ug/mL — ABNORMAL HIGH (ref <2.0)
Chicken, IgG: <0.15 ug/mL (ref <0.15)
Corn, IgG: <0.15 ug/mL (ref <0.15)
Egg yolk, IgG: 5.4 ug/mL — ABNORMAL HIGH (ref <2.0)

## 2013-09-23 ENCOUNTER — Telehealth: Payer: Self-pay | Admitting: Internal Medicine

## 2013-09-23 NOTE — Telephone Encounter (Signed)
Pt would like the results to her labs and also stated the rash on her forearm isn't getting any better.  She would like to know of any suggestions or if she can have a referral to a dermatologist??

## 2013-09-24 ENCOUNTER — Other Ambulatory Visit: Payer: Self-pay | Admitting: *Deleted

## 2013-09-24 DIAGNOSIS — R21 Rash and other nonspecific skin eruption: Secondary | ICD-10-CM

## 2013-09-24 NOTE — Telephone Encounter (Signed)
Refer to derm Labs ok

## 2013-09-24 NOTE — Telephone Encounter (Signed)
Note different for first message  Labs ok except lipids.... Needs to concentrate of weight loss and low fat diet Will need to monitor after diet trial and if fails will needs a statin Recheck in 90 days  Vit d levels low  2,000 iu each day  Refer to derm for rash

## 2013-11-12 ENCOUNTER — Encounter: Payer: Self-pay | Admitting: Internal Medicine

## 2014-03-23 ENCOUNTER — Telehealth: Payer: Self-pay | Admitting: Internal Medicine

## 2014-03-23 NOTE — Telephone Encounter (Signed)
Pt would like to know if you will accept her as a pt. Her son sees you and her husband is Lourdes Kucharski. She was a [facility] pt , states she met you w/ her son before and would like very much if you will accept her.. pls advise if ok.

## 2014-03-23 NOTE — Telephone Encounter (Signed)
Sure, let me know if she needs me to come in early one day to get her started or does she just need next available

## 2014-03-24 NOTE — Telephone Encounter (Signed)
Appointment scheduled for 09/08/14. Patient was okay with waiting but did want to be put on the wait list

## 2014-03-31 ENCOUNTER — Ambulatory Visit: Payer: 59 | Admitting: Family Medicine

## 2014-05-10 ENCOUNTER — Other Ambulatory Visit: Payer: Self-pay | Admitting: Emergency Medicine

## 2014-05-10 ENCOUNTER — Other Ambulatory Visit (HOSPITAL_BASED_OUTPATIENT_CLINIC_OR_DEPARTMENT_OTHER): Payer: 59

## 2014-05-10 DIAGNOSIS — C50411 Malignant neoplasm of upper-outer quadrant of right female breast: Secondary | ICD-10-CM

## 2014-05-10 LAB — COMPREHENSIVE METABOLIC PANEL (CC13)
ALBUMIN: 4 g/dL (ref 3.5–5.0)
ALK PHOS: 93 U/L (ref 40–150)
ALT: 20 U/L (ref 0–55)
AST: 21 U/L (ref 5–34)
Anion Gap: 7 mEq/L (ref 3–11)
BUN: 15 mg/dL (ref 7.0–26.0)
CO2: 28 mEq/L (ref 22–29)
Calcium: 9.6 mg/dL (ref 8.4–10.4)
Chloride: 104 mEq/L (ref 98–109)
Creatinine: 0.8 mg/dL (ref 0.6–1.1)
Glucose: 103 mg/dl (ref 70–140)
POTASSIUM: 4 meq/L (ref 3.5–5.1)
Sodium: 140 mEq/L (ref 136–145)
Total Bilirubin: 0.29 mg/dL (ref 0.20–1.20)
Total Protein: 7.9 g/dL (ref 6.4–8.3)

## 2014-05-10 LAB — CBC WITH DIFFERENTIAL/PLATELET
BASO%: 0.2 % (ref 0.0–2.0)
BASOS ABS: 0 10*3/uL (ref 0.0–0.1)
EOS%: 3.7 % (ref 0.0–7.0)
Eosinophils Absolute: 0.3 10*3/uL (ref 0.0–0.5)
HCT: 38.3 % (ref 34.8–46.6)
HGB: 12.6 g/dL (ref 11.6–15.9)
LYMPH%: 24.4 % (ref 14.0–49.7)
MCH: 29.4 pg (ref 25.1–34.0)
MCHC: 32.9 g/dL (ref 31.5–36.0)
MCV: 89.3 fL (ref 79.5–101.0)
MONO#: 0.7 10*3/uL (ref 0.1–0.9)
MONO%: 9.1 % (ref 0.0–14.0)
NEUT#: 5 10*3/uL (ref 1.5–6.5)
NEUT%: 62.6 % (ref 38.4–76.8)
Platelets: 230 10*3/uL (ref 145–400)
RBC: 4.29 10*6/uL (ref 3.70–5.45)
RDW: 14 % (ref 11.2–14.5)
WBC: 8 10*3/uL (ref 3.9–10.3)
lymph#: 2 10*3/uL (ref 0.9–3.3)

## 2014-05-17 ENCOUNTER — Telehealth: Payer: Self-pay | Admitting: Oncology

## 2014-05-17 ENCOUNTER — Ambulatory Visit (HOSPITAL_BASED_OUTPATIENT_CLINIC_OR_DEPARTMENT_OTHER): Payer: 59 | Admitting: Oncology

## 2014-05-17 VITALS — BP 122/74 | HR 85 | Temp 98.4°F | Resp 20 | Ht 60.0 in | Wt 197.1 lb

## 2014-05-17 DIAGNOSIS — C50411 Malignant neoplasm of upper-outer quadrant of right female breast: Secondary | ICD-10-CM

## 2014-05-17 DIAGNOSIS — Z170 Estrogen receptor positive status [ER+]: Secondary | ICD-10-CM

## 2014-05-17 DIAGNOSIS — R21 Rash and other nonspecific skin eruption: Secondary | ICD-10-CM

## 2014-05-17 MED ORDER — METHYLPREDNISOLONE 4 MG PO KIT: PACK | ORAL | Status: DC

## 2014-05-17 MED ORDER — KETOCONAZOLE 2 % EX CREA: 1.0000 "application " | TOPICAL_CREAM | Freq: Every day | CUTANEOUS | Status: DC

## 2014-05-17 NOTE — Progress Notes (Signed)
ID: Miranda Orozco   DOB: Apr 07, 1961  MR#: [id number]  ZCH#:[id number]  PCP: Penni Homans, MD GYN: Kendall Flack MD SU:  OTHER MD: Bo Merino, Delfin Edis, Rolm Bookbinder   HISTORY OF PRESENT ILLNESS: From the original intake note:  Around November 2010 Miranda Orozco herself palpated what felt like a mass in the upper portion of her right breast. She thought this likely was connected with menstruation but when it persisted into January, she brought it to Dr. Caralee Ates attention and was set up for mammography January 14th,.  Dr. Derrel Nip confirmed a vague palpable focus over the upper outer right breast.  The mammogram was heterogeneously dense with mildly increased density in the upper outer right breast.  There were a few scattered microcalcifications that were not significantly changed.  There was no definite focal abnormality to correspond to the palpable abnormality.  Ultrasound showed a vague hypoechoic area at the 10 o'clock position 5 cm from the nipple, corresponding to the palpable abnormality.    Biopsy of this area was performed January 18th and showed ([id number]) an invasive ductal carcinoma which appeared low grade. There was no evidence of angiolymphatic invasion.  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  The prognostic profile showed the tumor to be ER positive at 84%, PR positive at 85% with a low proliferation marker at 11% and Her-2 negative by CISH with a ratio of 1.04.    The patient was referred to Dr. Barry Dienes and bilateral breast MRIs were obtained January 24th.  This showed a large area of patchy, non-mass like low grade enhancement in the upper outer quadrant of the right breast, extending up to 10.7 cm maximally.  There were no abnormal appearing lymph nodes and no other areas of enhancement suspicious for malignancy in either breast.   The patient's subsequent history is as detailed below.  INTERVAL HISTORY: Miranda Orozco returns today for followup of her breast  cancer. The interval history is unremarkable. She is tolerating the tamoxifen with no hot flashes or vaginal wetness problems. She used Vagifem suppositories briefly, but has not been using them recently. She is not exercising regularly, but walks perhaps a quarter of a mile at a time 2 or 3 times a week  REVIEW OF SYSTEMS: Miranda Orozco has had a rash over her arms, lower legs, and upper back for about a year. She was given a steroid cream by her primary care physician and that helped a little but she used it very intermittently in it certainly didn't clear. The rash is itchy and almost painful in a burning way. It doesn't seem to be related to anything she wears eats or uses for cleaning. She thinks it may be related to stress. She has stabbing pain sometimes in the right chest wall and this is of course very common in patients after mastectomy. It does not indicate that there is any cancer there. We had set her up for a colonoscopy and she also had an appointment with the dermatologist, but with having to take care of her son and so many other issues she actually never followed up on. A detailed review of systems today was otherwise stable  PAST MEDICAL HISTORY: Past Medical History  Diagnosis Date  . MALIGNANT NEOPLASM OF BREAST UNSPECIFIED SITE 10/02/2009  . Hypoglycemia, unspecified 10/06/2009  . Esophageal reflux 10/02/2009  . OSTEOPENIA 10/02/2009  . BREAST CANCER, HX OF 10/02/2009  . Arthritis, rheumatic, acute or subacute 10/02/2009  Significant for a diagnosis of fibroadenoma made by left breast  biopsy in June 2007, a history of C-section times two, a history of surgery for significant left chest wall cellulitis apparently arising from a sebaceous cyst, history of degenerative disk disease and a history of rheumatoid arthritis  PAST SURGICAL HISTORY: Past Surgical History  Procedure Laterality Date  . Cesarean section      1996, 2001  . Mastectomy partial / lumpectomy w/ axillary lymphadenectomy   03/12/2010    Right w [facility] Dr Margot Chimes  . Breast lumpectomy  03/20/10    for margin  . Breast lumpectomy  03/26/2010    for margin  . Simple mastectomy  03/31/2011    For margin  . Dilatation & currettage/hysteroscopy with resectocope N/A 02/19/2013    Procedure: DILATATION & CURETTAGE/HYSTEROSCOPY WITH RESECTION OF POLYP;  Surgeon: Eldred Manges, MD;  Location: [facility];  Service: Gynecology;  Laterality: N/A;    FAMILY HISTORY Family History  Problem Relation Age of Onset  . Arthritis Other   . Diabetes Other   . Hypertension Other   . Hypertension Mother   . Hypertension Father   . Diabetes Father   . Colon cancer Neg Hx   . Stomach cancer Neg Hx   The patient's father is alive with has a history of type 2 diabetes, hypertension, and hypercholesterolemia.  The patient's mother is alive, with a history of hypertension.  GYNECOLOGIC HISTORY: She is Gx, P3, first pregnancy age 77.  She was premenopausal at the time of her breast cancer diagnosis.  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  Her husband [name] of course is our Social research officer, government at [employer].  Their children are Miranda Orozco is working as a Therapist, art who is handicapped with an undiagnosed genetic syndrome (has been extensively evaluated) and requires total care.  Miranda Orozco provided this themselves for many years, but more recently, they have been able to obtain help for about seven hours a day, which is a significant assist for the family].  [name] is Location manager.  The family attends [employer].   ADVANCED DIRECTIVES:  HEALTH MAINTENANCE: History  Substance Use Topics  . Smoking status: Former Smoker -- 0.50 packs/day for 1 years    Types: Cigarettes    Quit date: 10/17/1981  . Smokeless tobacco: Never Used  . Alcohol Use: 0.0 oz/week    2-3 Glasses of wine per week     Colonoscopy: Due  PAP: Up-to-date   Bone density: 04/02/2013, normal  Lipid panel:  No Known  Allergies  Current Outpatient Prescriptions  Medication Sig Dispense Refill  . desonide (DESOWEN) 0.05 % lotion Apply topically 2 (two) times daily. Mix 50 50 with sarna lotions and apply twice daily to the rash  118 mL  0  . ibuprofen (ADVIL,MOTRIN) 600 MG tablet Ibuprofen 600 mg orally every 6 hours for 3  days, then every 6 hours as needed. For pain  60 tablet  2  . Multiple Vitamins-Minerals (MULTIVITAMIN WITH MINERALS) tablet Take 1 tablet by mouth daily.      . tamoxifen (NOLVADEX) 20 MG tablet TAKE 1 TABLET BY MOUTH DAILY.  90 tablet  2   No current facility-administered medications for this visit.    OBJECTIVE: Middle-aged white woman who appears healthy Filed Vitals:   05/17/14 0854  BP: 122/74  Pulse: 85  Temp: 98.4 F (36.9 C)  Resp: 20     Body mass index is 38.49 kg/(m^2).    ECOG FS: 1  Sclerae unicteric, pupils round and equal  Oropharynx clear, teeth in good repair No cervical or supraclavicular adenopathy Lungs no rales or rhonchi Heart regular rate and rhythm Abd soft, nontender, positive bowel sounds MSK no focal spinal tenderness, no upper extremity lymphedema Neuro: nonfocal, well-oriented, positive affect Breasts: The right breast is status post mastectomy. There is no evidence of chest wall recurrence. The right axilla is benign. The left breast is unremarkable. Skin: The patient is a scattered maculopapular rash over the arms, dorsal more than ventral,. She also has a candidal rash, mild, under the left breast.  LAB RESULTS: Lab Results  Component Value Date   WBC 8.0 05/10/2014   NEUTROABS 5.0 05/10/2014   HGB 12.6 05/10/2014   HCT 38.3 05/10/2014   MCV 89.3 05/10/2014   PLT 230 05/10/2014      Chemistry      Component Value Date/Time   NA 140 05/10/2014 0931   NA 137 09/10/2013 1550   K 4.0 05/10/2014 0931   K 4.1 09/10/2013 1550   CL 101 09/10/2013 1550   CO2 28 05/10/2014 0931   CO2 28 09/10/2013 1550   BUN 15.0 05/10/2014 0931   BUN 16 09/10/2013 1550    CREATININE 0.8 05/10/2014 0931   CREATININE 0.8 09/10/2013 1550      Component Value Date/Time   CALCIUM 9.6 05/10/2014 0931   CALCIUM 9.3 09/10/2013 1550   ALKPHOS 93 05/10/2014 0931   ALKPHOS 80 09/10/2013 1550   AST 21 05/10/2014 0931   AST 28 09/10/2013 1550   ALT 20 05/10/2014 0931   ALT 29 09/10/2013 1550   BILITOT 0.29 05/10/2014 0931   BILITOT 0.4 09/10/2013 1550       Lab Results  Component Value Date   LABCA2 21 09/05/2011    No components found with this basename: [id number]    No results found for this basename: INR,  in the last 168 hours  Urinalysis    Component Value Date/Time   LABSPEC 1.025 03/20/2011 1405   LABSPEC 1.010 12/31/2010 1121   PHURINE 5.0 12/31/2010 1121   GLUCOSEU 250* 12/31/2010 1121   HGBUR MODERATE* 12/31/2010 1121   BILIRUBINUR n 09/10/2013 1608   BILIRUBINUR NEGATIVE 12/31/2010 1121   KETONESUR NEGATIVE 12/31/2010 1121   PROTEINUR n 09/10/2013 1608   PROTEINUR 30* 12/31/2010 1121   UROBILINOGEN 0.2 09/10/2013 1608   UROBILINOGEN 1.0 12/31/2010 1121   NITRITE n 09/10/2013 1608   NITRITE POSITIVE* 12/31/2010 1121   LEUKOCYTESUR small (1+) 09/10/2013 1608    STUDIES: No results found.  ASSESSMENT: 53 y.o. [place] woman status post right breast biopsy January of 2011 for a low-grade invasive ductal carcinoma which was strongly estrogen and progesterone receptor positive, HER-2 negative, with an MIB-1 of 18%, in the setting of extensive ductal carcinoma in situ;  (1) treated neoadjuvantly with doxorubicin/ cyclophosphamide in dose dense fashion x4 followed by weekly paclitaxel x12   (2) followed by lumpectomy and sentinel lymph node sampling 03/24/2010 showing no residual invasive disease but a 2.8 cm area of ductal carcinoma in situ, with positive margins.  All 4 sentinel lymph nodes were negative.  Margins could not be cleared despite repeated attempts and she underwent definitive mastectomy August of 2011.    (3) She completed post mastectomy radiation including  the right supraclavicular area in November of 2011   (4) started tamoxifen November 2011  PLAN:  Lajuanna is doing fine from a breast cancer point of view, now more than 4 years out from her definitive surgery with no evidence of  disease recurrence. Like many high achievers who spend a good deal of time caring for and working for the benefit of others, she neglects herself. She is behind in her left mammography, never saw a dermatologist for her rash, which has been going on now for a year, and never got a colonoscopy done.  We discussed all that today. I am starting her on a Medrol Dosepak. I think that we'll likely clear this rash or largely clear it, but it may recur. I suggested she go ahead and get an appointment with her dermatologist in 4-6 weeks anticipating that. She also has a very minor candidal rash in the inferior left mammary fold and I wrote her denies oral to use for that, since it's only going to get worse while on steroids.  I have requested an appointment with Dr. Delfin Edis to see if we can get a colonoscopy done before the end of the year. I also wrote the order for a left mammogram with mammography at [facility] within the month.  Otherwise the plan is to continue tamoxifen for a total of 10 years. I will see Rowynn again in one year. She knows to call for any problems that may develop before her next visit here. MAGRINAT,GUSTAV C    05/17/2014

## 2014-05-17 NOTE — Telephone Encounter (Signed)
[  per podf to schpt appt-sch pt mamma-gave pt date & time-pt stated will sch Dr Olevia Perches appt-gave copy of sch

## 2014-05-23 LAB — HM MAMMOGRAPHY

## 2014-05-24 ENCOUNTER — Encounter: Payer: Self-pay | Admitting: Family Medicine

## 2014-05-27 ENCOUNTER — Encounter: Payer: Self-pay | Admitting: Oncology

## 2014-06-06 ENCOUNTER — Encounter: Payer: Self-pay | Admitting: Internal Medicine

## 2014-06-08 ENCOUNTER — Encounter: Payer: Self-pay | Admitting: Family Medicine

## 2014-06-20 ENCOUNTER — Encounter: Payer: Self-pay | Admitting: Oncology

## 2014-09-08 ENCOUNTER — Ambulatory Visit (INDEPENDENT_AMBULATORY_CARE_PROVIDER_SITE_OTHER): Payer: 59 | Admitting: Family Medicine

## 2014-09-08 ENCOUNTER — Encounter: Payer: Self-pay | Admitting: Family Medicine

## 2014-09-08 VITALS — BP 123/85 | HR 84 | Temp 98.1°F | Ht 65.25 in | Wt 200.6 lb

## 2014-09-08 DIAGNOSIS — E669 Obesity, unspecified: Secondary | ICD-10-CM

## 2014-09-08 DIAGNOSIS — Z1211 Encounter for screening for malignant neoplasm of colon: Secondary | ICD-10-CM

## 2014-09-08 DIAGNOSIS — E162 Hypoglycemia, unspecified: Secondary | ICD-10-CM

## 2014-09-08 DIAGNOSIS — M949 Disorder of cartilage, unspecified: Secondary | ICD-10-CM

## 2014-09-08 DIAGNOSIS — D649 Anemia, unspecified: Secondary | ICD-10-CM

## 2014-09-08 DIAGNOSIS — L509 Urticaria, unspecified: Secondary | ICD-10-CM

## 2014-09-08 DIAGNOSIS — E782 Mixed hyperlipidemia: Secondary | ICD-10-CM

## 2014-09-08 DIAGNOSIS — T7840XA Allergy, unspecified, initial encounter: Secondary | ICD-10-CM

## 2014-09-08 DIAGNOSIS — K219 Gastro-esophageal reflux disease without esophagitis: Secondary | ICD-10-CM

## 2014-09-08 DIAGNOSIS — C50411 Malignant neoplasm of upper-outer quadrant of right female breast: Secondary | ICD-10-CM

## 2014-09-08 DIAGNOSIS — M899 Disorder of bone, unspecified: Secondary | ICD-10-CM

## 2014-09-08 LAB — LIPID PANEL
Cholesterol: 201 mg/dL — ABNORMAL HIGH (ref 0–200)
HDL: 51.5 mg/dL (ref >39.00)
NONHDL: 149.5
Total CHOL/HDL Ratio: 4
Triglycerides: 211 mg/dL — ABNORMAL HIGH (ref 0.0–149.0)
VLDL: 42.2 mg/dL — AB (ref 0.0–40.0)

## 2014-09-08 LAB — COMPREHENSIVE METABOLIC PANEL
ALT: 18 U/L (ref 0–35)
AST: 20 U/L (ref 0–37)
Albumin: 4.5 g/dL (ref 3.5–5.2)
Alkaline Phosphatase: 93 U/L (ref 39–117)
BILIRUBIN TOTAL: 0.3 mg/dL (ref 0.2–1.2)
BUN: 18 mg/dL (ref 6–23)
CALCIUM: 9.8 mg/dL (ref 8.4–10.5)
CHLORIDE: 101 meq/L (ref 96–112)
CO2: 31 meq/L (ref 19–32)
Creatinine, Ser: 0.71 mg/dL (ref 0.40–1.20)
GFR: 91.51 mL/min (ref >60.00)
GLUCOSE: 82 mg/dL (ref 70–99)
POTASSIUM: 4.2 meq/L (ref 3.5–5.1)
Sodium: 138 mEq/L (ref 135–145)
Total Protein: 7.9 g/dL (ref 6.0–8.3)

## 2014-09-08 LAB — CBC
HCT: 38.3 % (ref 36.0–46.0)
Hemoglobin: 13.1 g/dL (ref 12.0–15.0)
MCHC: 34.2 g/dL (ref 30.0–36.0)
MCV: 86.1 fl (ref 78.0–100.0)
PLATELETS: 297 10*3/uL (ref 150.0–400.0)
RBC: 4.44 Mil/uL (ref 3.87–5.11)
RDW: 14.4 % (ref 11.5–15.5)
WBC: 8.5 10*3/uL (ref 4.0–10.5)

## 2014-09-08 LAB — LDL CHOLESTEROL, DIRECT: LDL DIRECT: 110 mg/dL

## 2014-09-08 LAB — TSH: TSH: 3.05 u[IU]/mL (ref 0.35–4.50)

## 2014-09-08 MED ORDER — CETIRIZINE HCL 10 MG PO TABS: 10.0000 mg | ORAL_TABLET | Freq: Two times a day (BID) | ORAL | Status: DC | PRN

## 2014-09-08 MED ORDER — FLUTICASONE PROPIONATE 50 MCG/ACT NA SUSP: 2.0000 | Freq: Every day | NASAL | Status: DC

## 2014-09-08 MED ORDER — TRIAMCINOLONE ACETONIDE 0.1 % EX CREA: 1.0000 "application " | TOPICAL_CREAM | Freq: Two times a day (BID) | CUTANEOUS | Status: DC | PRN

## 2014-09-08 MED ORDER — RANITIDINE HCL 150 MG PO TABS: 150.0000 mg | ORAL_TABLET | Freq: Two times a day (BID) | ORAL | Status: DC | PRN

## 2014-09-08 NOTE — Patient Instructions (Addendum)
Zyrtec 10  Mg and Zantac 150 mg twice daily x 2-4 weeks, if doing better drop to once daily if still better can drop the Zantac and see how you do.   Dermatology Dr Renda Rolls or Marvis Repress Hives are itchy, red, swollen areas of the skin. They can vary in size and location on your body. Hives can come and go for hours or several days (acute hives) or for several weeks (chronic hives). Hives do not spread from person to person (noncontagious). They may get worse with scratching, exercise, and emotional stress. CAUSES   Allergic reaction to food, additives, or drugs.  Infections, including the common cold.  Illness, such as vasculitis, lupus, or thyroid disease.  Exposure to sunlight, heat, or cold.  Exercise.  Stress.  Contact with chemicals. SYMPTOMS   Red or white swollen patches on the skin. The patches may change size, shape, and location quickly and repeatedly.  Itching.  Swelling of the hands, feet, and face. This may occur if hives develop deeper in the skin. DIAGNOSIS  Your caregiver can usually tell what is wrong by performing a physical exam. Skin or blood tests may also be done to determine the cause of your hives. In some cases, the cause cannot be determined. TREATMENT  Mild cases usually get better with medicines such as antihistamines. Severe cases may require an emergency epinephrine injection. If the cause of your hives is known, treatment includes avoiding that trigger.  HOME CARE INSTRUCTIONS   Avoid causes that trigger your hives.  Take antihistamines as directed by your caregiver to reduce the severity of your hives. Non-sedating or low-sedating antihistamines are usually recommended. Do not drive while taking an antihistamine.  Take any other medicines prescribed for itching as directed by your caregiver.  Wear loose-fitting clothing.  Keep all follow-up appointments as directed by your caregiver. SEEK MEDICAL CARE IF:   You have persistent or  severe itching that is not relieved with medicine.  You have painful or swollen joints. SEEK IMMEDIATE MEDICAL CARE IF:   You have a fever.  Your tongue or lips are swollen.  You have trouble breathing or swallowing.  You feel tightness in the throat or chest.  You have abdominal pain. These problems may be the first sign of a life-threatening allergic reaction. Call your local emergency services (911 in [place] MAKE SURE YOU:   Understand these instructions.  Will watch your condition.  Will get help right away if you are not doing well or get worse. Document Released: 07/22/2005 Document Revised: 07/27/2013 Document Reviewed: 10/15/2011 [facility] Patient Information 2015 [place], [place]. This information is not intended to replace advice given to you by your health care provider. Make sure you discuss any questions you have with your health care provider.

## 2014-09-08 NOTE — Progress Notes (Signed)
Pre visit review using our clinic review tool, if applicable. No additional management support is needed unless otherwise documented below in the visit note. 

## 2014-09-08 NOTE — Progress Notes (Signed)
Miranda Orozco  [id number] 06/25/1961 09/08/2014      Progress Note-Follow Up  Subjective  Chief Complaint  Chief Complaint  Patient presents with  . Establish Care    HPI  Patient is a 54 y.o. female in today for routine medical care. She is intubated to establish care. Her previous PMD has left primary care. Her greatest complaint today is of recurrent pruritic lesions. They are most notable on her arms and behind her knees but can occur elsewhere. She has a past medical history significant for osteopenia, breast cancer, reflux, osteoarthritis and elevated body mass index. She has no other acute complaints other than her skin today. She continues to follow with Dr. Jana Hakim of oncology and received her GYN care from Dr. Leo Grosser.  Past Medical History  Diagnosis Date  . MALIGNANT NEOPLASM OF BREAST UNSPECIFIED SITE 10/02/2009  . Hypoglycemia, unspecified 10/06/2009  . Esophageal reflux 10/02/2009  . OSTEOPENIA 10/02/2009  . BREAST CANCER, HX OF 10/02/2009  . Arthritis, rheumatic, acute or subacute 10/02/2009    Past Surgical History  Procedure Laterality Date  . Cesarean section      1996, 2001  . Mastectomy partial / lumpectomy w/ axillary lymphadenectomy  03/12/2010    Right w [facility] Dr Margot Chimes  . Breast lumpectomy  03/20/10    for margin  . Breast lumpectomy  03/26/2010    for margin  . Simple mastectomy  03/31/2011    For margin  . Dilatation & currettage/hysteroscopy with resectocope N/A 02/19/2013    Procedure: DILATATION & CURETTAGE/HYSTEROSCOPY WITH RESECTION OF POLYP;  Surgeon: Eldred Manges, MD;  Location: [facility];  Service: Gynecology;  Laterality: N/A;    Family History  Problem Relation Age of Onset  . Arthritis Other   . Diabetes Other   . Hypertension Other   . Hypertension Mother   . Hypertension Father   . Diabetes Father   . Colon cancer Neg Hx   . Stomach cancer Neg Hx     History   Social History  . Marital Status: Married    Spouse Name: N/A   Number of Children: N/A  . Years of Education: N/A   Occupational History  . Not on file.   Social History Main Topics  . Smoking status: Former Smoker -- 0.50 packs/day for 1 years    Types: Cigarettes    Quit date: 10/17/1981  . Smokeless tobacco: Never Used  . Alcohol Use: 0.0 oz/week    2-3 Glasses of wine per week  . Drug Use: No  . Sexual Activity: Yes    Birth Control/ Protection: None   Other Topics Concern  . Not on file   Social History Narrative    Current Outpatient Prescriptions on File Prior to Visit  Medication Sig Dispense Refill  . ibuprofen (ADVIL,MOTRIN) 600 MG tablet Ibuprofen 600 mg orally every 6 hours for 3  days, then every 6 hours as needed. For pain 60 tablet 2  . Multiple Vitamins-Minerals (MULTIVITAMIN WITH MINERALS) tablet Take 1 tablet by mouth daily.     No current facility-administered medications on file prior to visit.    No Known Allergies  Review of Systems  Review of Systems  Constitutional: Negative for fever, chills and malaise/fatigue.  HENT: Negative for congestion, hearing loss and nosebleeds.   Eyes: Negative for discharge.  Respiratory: Negative for cough, sputum production, shortness of breath and wheezing.   Cardiovascular: Negative for chest pain, palpitations and leg swelling.  Gastrointestinal: Negative for heartburn, nausea,  vomiting, abdominal pain, diarrhea, constipation and blood in stool.  Genitourinary: Negative for dysuria, urgency, frequency and hematuria.  Musculoskeletal: Negative for myalgias, back pain and falls.  Skin: Negative for rash.  Neurological: Negative for dizziness, tremors, sensory change, focal weakness, loss of consciousness, weakness and headaches.  Endo/Heme/Allergies: Negative for polydipsia. Does not bruise/bleed easily.  Psychiatric/Behavioral: Negative for depression and suicidal ideas. The patient is not nervous/anxious and does not have insomnia.     Objective  BP 123/85 mmHg  Pulse  84  Temp(Src) 98.1 F (36.7 C) (Oral)  Ht 5' 5.25" (1.657 m)  Wt 200 lb 9.6 oz (90.992 kg)  BMI 33.14 kg/m2  SpO2 97%  Physical Exam  Physical Exam  Constitutional: She is oriented to person, place, and time and well-developed, well-nourished, and in no distress. No distress.  HENT:  Head: Normocephalic and atraumatic.  Right Ear: External ear normal.  Left Ear: External ear normal.  Nose: Nose normal.  Mouth/Throat: Oropharynx is clear and moist. No oropharyngeal exudate.  Eyes: Conjunctivae are normal. Pupils are equal, round, and reactive to light. Right eye exhibits no discharge. Left eye exhibits no discharge. No scleral icterus.  Neck: Normal range of motion. Neck supple. No thyromegaly present.  Cardiovascular: Normal rate, regular rhythm, normal heart sounds and intact distal pulses.   No murmur heard. Pulmonary/Chest: Effort normal and breath sounds normal. No respiratory distress. She has no wheezes. She has no rales.  Abdominal: Soft. Bowel sounds are normal. She exhibits no distension and no mass. There is no tenderness.  Musculoskeletal: Normal range of motion. She exhibits no edema or tenderness.  Lymphadenopathy:    She has no cervical adenopathy.  Neurological: She is alert and oriented to person, place, and time. She has normal reflexes. No cranial nerve deficit. Coordination normal.  Skin: Skin is warm and dry. No rash noted. She is not diaphoretic.  Psychiatric: Mood, memory and affect normal.    Lab Results  Component Value Date   TSH 1.94 09/10/2013   Lab Results  Component Value Date   WBC 8.0 05/10/2014   HGB 12.6 05/10/2014   HCT 38.3 05/10/2014   MCV 89.3 05/10/2014   PLT 230 05/10/2014   Lab Results  Component Value Date   CREATININE 0.8 05/10/2014   BUN 15.0 05/10/2014   NA 140 05/10/2014   K 4.0 05/10/2014   CL 101 09/10/2013   CO2 28 05/10/2014   Lab Results  Component Value Date   ALT 20 05/10/2014   AST 21 05/10/2014   ALKPHOS 93  05/10/2014   BILITOT 0.29 05/10/2014   Lab Results  Component Value Date   CHOL 216* 09/10/2013   Lab Results  Component Value Date   HDL 47.30 09/10/2013   No results found for: [facility] Lab Results  Component Value Date   TRIG 212.0* 09/10/2013   Lab Results  Component Value Date   CHOLHDL 5 09/10/2013     Assessment & Plan  ESOPHAGEAL REFLUX Avoid offending foods, take probiotics. Do not eat large meals in late evening and consider raising head of bed.    Anemia Resolved when labwork checked   Obesity (BMI 30.0-34.9) Encouraged DASH diet, decrease po intake and increase exercise as tolerated. Needs 7-8 hours of sleep nightly. Avoid trans fats, eat small, frequent meals every 4-5 hours with lean proteins, complex carbs and healthy fats. Minimize simple carbs, GMO foods.   Disorder of bone and cartilage Encouraged vitamin D supplements and exercise, will need to monitor.  Breast cancer of upper-outer quadrant of right female breast Following with oncology. No recent concerns.   Hives Patient with recurrent pruritic rashes, encouraged Zyrtec and Zantac bid and Triamcinolone cream prn. May need referral to dermatology   Hyperlipidemia, mixed Encouraged heart healthy diet, increase exercise, avoid trans fats, consider a krill oil cap daily

## 2014-09-18 ENCOUNTER — Encounter: Payer: Self-pay | Admitting: Family Medicine

## 2014-09-18 DIAGNOSIS — E782 Mixed hyperlipidemia: Secondary | ICD-10-CM | POA: Insufficient documentation

## 2014-09-18 DIAGNOSIS — L509 Urticaria, unspecified: Secondary | ICD-10-CM

## 2014-09-18 HISTORY — DX: Urticaria, unspecified: L50.9

## 2014-09-18 NOTE — Assessment & Plan Note (Signed)
Referred to gastroenterology for screening colonoscopy

## 2014-09-18 NOTE — Assessment & Plan Note (Signed)
Encouraged heart healthy diet, increase exercise, avoid trans fats, consider a krill oil cap daily 

## 2014-09-18 NOTE — Assessment & Plan Note (Signed)
Avoid offending foods, take probiotics. Do not eat large meals in late evening and consider raising head of bed.  

## 2014-09-18 NOTE — Assessment & Plan Note (Addendum)
Patient with recurrent pruritic rashes, encouraged Zyrtec and Zantac bid and Triamcinolone cream prn. May need referral to dermatology

## 2014-09-18 NOTE — Assessment & Plan Note (Signed)
Following with oncology. No recent concerns.

## 2014-09-18 NOTE — Assessment & Plan Note (Signed)
Encouraged vitamin D supplements and exercise, will need to monitor.

## 2014-09-18 NOTE — Assessment & Plan Note (Addendum)
Encouraged DASH diet, decrease po intake and increase exercise as tolerated. Needs 7-8 hours of sleep nightly. Avoid trans fats, eat small, frequent meals every 4-5 hours with lean proteins, complex carbs and healthy fats. Minimize simple carbs, GMO foods. Referred to nutritionist for counseling.

## 2014-09-18 NOTE — Assessment & Plan Note (Signed)
Resolved when labwork checked

## 2014-09-21 ENCOUNTER — Ambulatory Visit: Payer: 59 | Admitting: Dietician

## 2014-09-29 ENCOUNTER — Encounter: Payer: 59 | Attending: Family Medicine | Admitting: Dietician

## 2014-09-29 ENCOUNTER — Encounter: Payer: Self-pay | Admitting: Dietician

## 2014-09-29 VITALS — Ht 65.5 in | Wt 204.4 lb

## 2014-09-29 DIAGNOSIS — E669 Obesity, unspecified: Secondary | ICD-10-CM | POA: Diagnosis present

## 2014-09-29 DIAGNOSIS — Z713 Dietary counseling and surveillance: Secondary | ICD-10-CM | POA: Insufficient documentation

## 2014-09-29 DIAGNOSIS — Z6833 Body mass index (BMI) 33.0-33.9, adult: Secondary | ICD-10-CM | POA: Diagnosis not present

## 2014-09-29 NOTE — Progress Notes (Signed)
Medical Nutrition Therapy:  Appt start time: 1330 end time:  1430.   Assessment:  Primary concerns today: obesity. Patient also has elevated cholesterol and TG and has had GDM in the past.  No recent HgA1c on file but fasting glucose from her last blood test was normal.  She lives with her husband and two of her three kids live at home as well.  Both her and her husband do the food shopping and cooking.  She works as an Astronomer as needed, no set schedule but states she "stays busy".  She reports her normal adult weight was around 170-180 lbs and she has gradually gained weight over the last 3-4 years with less physical activity during her diagnosis and treatment of breast cancer.  Her and her family eat out about 3x/week.  When they eat at home it is in the kitchen, sometimes as a family when possible.   She reports trouble sleeping and being a "night owl".  She gets about 4-6 hours each night and may get up in the middle of the night with her 74 year old son who is disabled.  She may nap during the day if she has time.  With the daily stress of taking care of her son and his disabilities she has often realized she may go the whole day without taking time to take care for herself.  They have a home nursing service that comes early each day (5:30am) but often the nurse may call out or be unreliable which is additional stress on her.  She states she realizes that finding a balance for this part of her life is a priority for reducing her stress and helping her to achieve her weight loss goals.  She has excellent nutrition questions and also wants to know what a realistic healthy weight for her would be.  She has had some success with weight loss of 15-20 lbs in the past but then fell into old habits.  She states she is motivated to make changes both to her food and exercise.  Preferred Learning Style:  No preference indicated   Learning Readiness:  Ready  MEDICATIONS: see list   DIETARY  INTAKE:  Usual eating pattern includes 3 meals and 3 snacks per day.  24-hr recall:  B ( AM): sunny side up eggs, or 2 slices of toast with cream cheese, diet coke  Snk ( AM): cereal bar  L (12:30-1 PM): sandwich or subway or leftovers Snk ( PM): bag of chips, candy bar, or fruit D ( PM): white rice with steak or chicken, salad, diet coke or glass of wine Snk ( PM): chocolate pudding or cereal Beverages: 3-4 diet cokes/day, water, occasionally diet cranberry juice, occasional glass of wine  Usual physical activity: no structured activity currently, she states she likes walking and thinks that would be the best start for her exercise plan.  She also recently got a FitBit for christmas but has not used it yet.  Estimated energy needs: 1400-1600 calories  Progress Towards Goal(s):  Not yet started   Nutritional Diagnosis:  [place]-3.3 Overweight/obesity As related to mindless eating and limited physical activity.  As evidenced by patient report and BMI of 33.5.    Intervention:  Nutrition education and counseling.  Discussed healthy, balanced meals utilizing MyPlate.  Discussed meal timing and mindful eating to limit stress eating.  Encouraged her to make time for herself to sit down and enjoy meals and snacks.  Discussed quick and easy meal and snack  options.  Discussed nutrition strategies to improve cholesterol adn triglycerides.  Stated benefits of exercise.  Brainstormed with patients ways to incorporate daily activity into her routine.  Teaching Method Utilized: Visual Auditory  Handouts given during visit include:  MyPlate Portion Plate  Heart Healthy Foods  Barriers to learning/adherence to lifestyle change: none  Demonstrated degree of understanding via:  Teach Back   Monitoring/Evaluation:  Dietary intake, exercise, labs, and body weight in 3 month(s).

## 2014-11-10 ENCOUNTER — Ambulatory Visit: Payer: 59 | Admitting: Dietician

## 2015-05-22 ENCOUNTER — Other Ambulatory Visit: Payer: Self-pay | Admitting: *Deleted

## 2015-05-22 DIAGNOSIS — C50411 Malignant neoplasm of upper-outer quadrant of right female breast: Secondary | ICD-10-CM

## 2015-05-23 ENCOUNTER — Other Ambulatory Visit: Payer: 59

## 2015-05-30 ENCOUNTER — Ambulatory Visit (HOSPITAL_BASED_OUTPATIENT_CLINIC_OR_DEPARTMENT_OTHER): Payer: 59

## 2015-05-30 ENCOUNTER — Telehealth: Payer: Self-pay | Admitting: Oncology

## 2015-05-30 ENCOUNTER — Ambulatory Visit (HOSPITAL_BASED_OUTPATIENT_CLINIC_OR_DEPARTMENT_OTHER): Payer: 59 | Admitting: Oncology

## 2015-05-30 VITALS — BP 141/75 | HR 87 | Temp 98.2°F | Resp 18 | Ht 65.5 in | Wt 213.2 lb

## 2015-05-30 DIAGNOSIS — C50411 Malignant neoplasm of upper-outer quadrant of right female breast: Secondary | ICD-10-CM

## 2015-05-30 DIAGNOSIS — Z170 Estrogen receptor positive status [ER+]: Secondary | ICD-10-CM | POA: Diagnosis not present

## 2015-05-30 DIAGNOSIS — R21 Rash and other nonspecific skin eruption: Secondary | ICD-10-CM

## 2015-05-30 LAB — CBC WITH DIFFERENTIAL/PLATELET
BASO%: 0.5 % (ref 0.0–2.0)
Basophils Absolute: 0 10*3/uL (ref 0.0–0.1)
EOS%: 4.1 % (ref 0.0–7.0)
Eosinophils Absolute: 0.3 10*3/uL (ref 0.0–0.5)
HCT: 39.4 % (ref 34.8–46.6)
HGB: 13.3 g/dL (ref 11.6–15.9)
LYMPH%: 21.4 % (ref 14.0–49.7)
MCH: 29.7 pg (ref 25.1–34.0)
MCHC: 33.8 g/dL (ref 31.5–36.0)
MCV: 87.9 fL (ref 79.5–101.0)
MONO#: 0.6 10*3/uL (ref 0.1–0.9)
MONO%: 7.2 % (ref 0.0–14.0)
NEUT%: 66.8 % (ref 38.4–76.8)
NEUTROS ABS: 5.3 10*3/uL (ref 1.5–6.5)
Platelets: 270 10*3/uL (ref 145–400)
RBC: 4.48 10*6/uL (ref 3.70–5.45)
RDW: 13.7 % (ref 11.2–14.5)
WBC: 7.9 10*3/uL (ref 3.9–10.3)
lymph#: 1.7 10*3/uL (ref 0.9–3.3)

## 2015-05-30 LAB — COMPREHENSIVE METABOLIC PANEL (CC13)
ALT: 25 U/L (ref 0–55)
AST: 23 U/L (ref 5–34)
Albumin: 4.1 g/dL (ref 3.5–5.0)
Alkaline Phosphatase: 110 U/L (ref 40–150)
Anion Gap: 7 mEq/L (ref 3–11)
BUN: 14.5 mg/dL (ref 7.0–26.0)
CO2: 29 meq/L (ref 22–29)
CREATININE: 0.8 mg/dL (ref 0.6–1.1)
Calcium: 9.6 mg/dL (ref 8.4–10.4)
Chloride: 103 mEq/L (ref 98–109)
EGFR: 80 mL/min/{1.73_m2} — ABNORMAL LOW (ref >90)
GLUCOSE: 136 mg/dL (ref 70–140)
Potassium: 3.6 mEq/L (ref 3.5–5.1)
SODIUM: 140 meq/L (ref 136–145)
TOTAL PROTEIN: 8.1 g/dL (ref 6.4–8.3)

## 2015-05-30 NOTE — Progress Notes (Signed)
ID: Miranda Orozco   DOB: 18-Apr-1961  MR#: [id number]  YJE#:[id number]  PCP: Penni Homans, MD GYN: Kendall Flack MD SU:  OTHER MD: Bo Merino   HISTORY OF PRESENT ILLNESS: From the original intake note:  Around November 2010 Miranda Orozco herself palpated what felt like a mass in the upper portion of her right breast. She thought this likely was connected with menstruation but when it persisted into January, she brought it to Dr. Caralee Ates attention and was set up for mammography January 14th,.  Dr. Derrel Nip confirmed a vague palpable focus over the upper outer right breast.  The mammogram was heterogeneously dense with mildly increased density in the upper outer right breast.  There were a few scattered microcalcifications that were not significantly changed.  There was no definite focal abnormality to correspond to the palpable abnormality.  Ultrasound showed a vague hypoechoic area at the 10 o'clock position 5 cm from the nipple, corresponding to the palpable abnormality.    Biopsy of this area was performed January 18th and showed ([id number]) an invasive ductal carcinoma which appeared low grade. There was no evidence of angiolymphatic invasion.  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  The prognostic profile showed the tumor to be ER positive at 84%, PR positive at 85% with a low proliferation marker at 11% and Her-2 negative by CISH with a ratio of 1.04.    The patient was referred to Dr. Barry Dienes and bilateral breast MRIs were obtained January 24th.  This showed a large area of patchy, non-mass like low grade enhancement in the upper outer quadrant of the right breast, extending up to 10.7 cm maximally.  There were no abnormal appearing lymph nodes and no other areas of enhancement suspicious for malignancy in either breast.   The patient's subsequent history is as detailed below.  INTERVAL HISTORY: Miranda Orozco returns today for followup of her breast cancer. She tells me  sometime in August she stopped the tamoxifen because she was having a skin rash. The rash has not cleared however she has not resumed the tamoxifen at this point.  She is past due for her mammogram this year. I had set her up for colonoscopy as had her primary care physician Dr. Charlett Blake but she was unable to get that done because really she is so busy with home and outside the home activities. She also has not been able to see a dermatologist regarding the rash.  REVIEW OF SYSTEMS: Miranda Orozco has significant pain in her left shoulder, which she associates with her recent flu shot. She has limited range of motion there. Again the rash persists. This has been present for over a year. At this point she is not exercising regularly. She is not using estrogen suppositories for vaginal dryness at this point. A detailed review of systems was otherwise stable  PAST MEDICAL HISTORY: Past Medical History  Diagnosis Date  . MALIGNANT NEOPLASM OF BREAST UNSPECIFIED SITE 10/02/2009  . Hypoglycemia, unspecified 10/06/2009  . Esophageal reflux 10/02/2009  . OSTEOPENIA 10/02/2009  . BREAST CANCER, HX OF 10/02/2009  . Arthritis, rheumatic, acute or subacute 10/02/2009  . Disorder of bone and cartilage 10/02/2009    Qualifier: Diagnosis of  By: [name] MD, [name]   . Hives 09/18/2014  . Obesity   Significant for a diagnosis of fibroadenoma made by left breast biopsy in June 2007, a history of C-section times two, a history of surgery for significant left chest wall cellulitis apparently arising from a sebaceous cyst, history of  degenerative disk disease and a history of rheumatoid arthritis  PAST SURGICAL HISTORY: Past Surgical History  Procedure Laterality Date  . Cesarean section      1996, 2001  . Mastectomy partial / lumpectomy w/ axillary lymphadenectomy  03/12/2010    Right w [facility] Dr Margot Chimes  . Breast lumpectomy  03/20/10    for margin  . Breast lumpectomy  03/26/2010    for margin  . Simple mastectomy  03/31/2011     For margin  . Dilatation & currettage/hysteroscopy with resectocope N/A 02/19/2013    Procedure: DILATATION & CURETTAGE/HYSTEROSCOPY WITH RESECTION OF POLYP;  Surgeon: Eldred Manges, MD;  Location: [facility];  Service: Gynecology;  Laterality: N/A;    FAMILY HISTORY Family History  Problem Relation Age of Onset  . Arthritis Other   . Diabetes Other   . Hypertension Other   . Hypertension Mother   . Hyperlipidemia Mother   . Hypertension Father   . Diabetes Father   . Cancer Father     lung cancer, smoker  . Colon cancer Neg Hx   . Stomach cancer Neg Hx   The patient's father is alive with has a history of type 2 diabetes, hypertension, and hypercholesterolemia.  The patient's mother is alive, with a history of hypertension.  GYNECOLOGIC HISTORY: She is Gx, P3, first pregnancy age 16.  She was premenopausal at the time of her breast cancer diagnosis.  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  Her husband Miranda Orozco of course is our Social research officer, government at [employer].  Their children are Miranda Orozco working as a Therapist, Miranda Orozco who is handicapped with an undiagnosed genetic syndrome (has been extensively evaluated) and requires total care.  [name] provided this themselves for many years, but more recently, they have been able to obtain help for about seven hours a day, which is a significant assist for the family].  [name] is Location manager.  The family attends [employer].    ADVANCED DIRECTIVES: Not in place  HEALTH MAINTENANCE: Social History  Substance Use Topics  . Smoking status: Former Smoker -- 0.50 packs/day for 1 years    Types: Cigarettes    Quit date: 10/17/1981  . Smokeless tobacco: Never Used  . Alcohol Use: 0.0 oz/week    2-3 Glasses of wine per week     Colonoscopy: Due  PAP: Up-to-date   Bone density: 04/02/2013, normal  Lipid panel:  No Known Allergies  Current Outpatient Prescriptions  Medication Sig Dispense Refill  . ibuprofen  (ADVIL,MOTRIN) 600 MG tablet Ibuprofen 600 mg orally every 6 hours for 3  days, then every 6 hours as needed. For pain 60 tablet 2  . Multiple Vitamins-Minerals (MULTIVITAMIN WITH MINERALS) tablet Take 1 tablet by mouth daily.     No current facility-administered medications for this visit.    OBJECTIVE: Middle-aged white woman w in no acute distress Filed Vitals:   05/30/15 1353  BP: 141/75  Pulse: 87  Temp: 98.2 F (36.8 C)  Resp: 18     Body mass index is 34.93 kg/(m^2).    ECOG FS: 1  Sclerae unicteric, EOMs intact Oropharynx clear, dentition in good repair No cervical or supraclavicular adenopathy Lungs no rales or rhonchi Heart regular rate and rhythm Abd soft, nontender, positive bowel sounds MSK no focal spinal tenderness, no upper extremity lymphedema Neuro: nonfocal, well oriented, appropriate affect Breasts: The right breast is status post mastectomy. There is no evidence of chest wall recurrence. The right axilla  is benign. The left breast is unremarkable   LAB RESULTS: Lab Results  Component Value Date   WBC 7.9 05/30/2015   NEUTROABS 5.3 05/30/2015   HGB 13.3 05/30/2015   HCT 39.4 05/30/2015   MCV 87.9 05/30/2015   PLT 270 05/30/2015      Chemistry      Component Value Date/Time   NA 140 05/30/2015 1458   NA 138 09/08/2014 1210   K 3.6 05/30/2015 1458   K 4.2 09/08/2014 1210   CL 101 09/08/2014 1210   CO2 29 05/30/2015 1458   CO2 31 09/08/2014 1210   BUN 14.5 05/30/2015 1458   BUN 18 09/08/2014 1210   CREATININE 0.8 05/30/2015 1458   CREATININE 0.71 09/08/2014 1210      Component Value Date/Time   CALCIUM 9.6 05/30/2015 1458   CALCIUM 9.8 09/08/2014 1210   ALKPHOS 110 05/30/2015 1458   ALKPHOS 93 09/08/2014 1210   AST 23 05/30/2015 1458   AST 20 09/08/2014 1210   ALT 25 05/30/2015 1458   ALT 18 09/08/2014 1210   BILITOT <0.30 05/30/2015 1458   BILITOT 0.3 09/08/2014 1210       Lab Results  Component Value Date   LABCA2 21 09/05/2011     No components found for: [id number]  No results for input(s): INR in the last 168 hours.  Urinalysis    Component Value Date/Time   LABSPEC 1.025 03/20/2011 1405   LABSPEC 1.010 12/31/2010 1121   PHURINE 6.0 03/20/2011 1405   PHURINE 5.0 12/31/2010 1121   GLUCOSEU 250* 12/31/2010 1121   HGBUR Moderate 03/20/2011 1405   HGBUR MODERATE* 12/31/2010 1121   BILIRUBINUR n 09/10/2013 1608   BILIRUBINUR Negative 03/20/2011 [place] 12/31/2010 1121   KETONESUR Negative 03/20/2011 1405   KETONESUR NEGATIVE 12/31/2010 1121   PROTEINUR n 09/10/2013 1608   PROTEINUR Negative 03/20/2011 1405   PROTEINUR 30* 12/31/2010 1121   UROBILINOGEN 0.2 09/10/2013 1608   UROBILINOGEN 1.0 12/31/2010 1121   NITRITE n 09/10/2013 1608   NITRITE Negative 03/20/2011 1405   NITRITE POSITIVE* 12/31/2010 1121   LEUKOCYTESUR small (1+) 09/10/2013 1608   LEUKOCYTESUR Moderate 03/20/2011 1405    STUDIES: No results found.  ASSESSMENT: 54 y.o. [place] woman status post right breast biopsy January of 2011 for an  invasive ductal carcinoma, grade 1, strongly estrogen and progesterone receptor positive, HER-2 negative, with an MIB-1 of 18%, in the setting of extensive ductal carcinoma in situ;  (1) treated neoadjuvantly with doxorubicin/ cyclophosphamide in dose dense fashion x4 followed by weekly paclitaxel x12   (2) followed by lumpectomy and sentinel lymph node sampling 03/24/2010 showing no residual invasive disease but a 2.8 cm area of ductal carcinoma in situ, with positive margins.  All 4 sentinel lymph nodes were negative.  Margins could not be cleared despite repeated attempts and she underwent definitive mastectomy August of 2011.    (3) She completed post mastectomy radiation including the right supraclavicular area in November of 2011   (4) started tamoxifen November 2011, stopped it August 2016 due to concerns regarding her rash.  PLAN:  Ellison is very busy with her disabled  child and also with community activities. She is neglecting her health. She really needs to get her mammogram done, needs to get up-to-date on colonoscopy, and needs to see a dermatologist. She didn't get her previsit lab work done--we will try to get it done today.   I don't think the rash she has experienced is anything  to do with tamoxifen. Nevertheless we will wait until she has dermatologic evaluation before restarting that drug. If she goes off tamoxifen she will not be able to safely use vaginal estrogen preparations so I would be in favor of going back on tamoxifen for an additional 5 years and we will be discussing that once we have a little bit more data.  I am referring her for colonoscopy, setting her up for mammography, and requesting a dermatologic evaluation. I will check with her in 2 months just to make sure this is getting done. At that time we will discuss going back on tamoxifen.  She knows to call for any problems that may develop before her next visit here, which is currently scheduled for 1 year from now. MAGRINAT,GUSTAV C    05/31/2015

## 2015-05-30 NOTE — Telephone Encounter (Signed)
Appointments made and avs printed for pateint °

## 2015-05-31 ENCOUNTER — Telehealth: Payer: Self-pay | Admitting: Oncology

## 2015-05-31 NOTE — Telephone Encounter (Signed)
Called patient and left a message with her mammo at [facility] 06/26/15 9:15,patient to see dr tafeen 06/09/15 12:45,also left information for dr Collene Mares for colon screening  For her to call at her convience

## 2016-01-13 DIAGNOSIS — N390 Urinary tract infection, site not specified: Secondary | ICD-10-CM | POA: Diagnosis not present

## 2016-05-30 ENCOUNTER — Ambulatory Visit (HOSPITAL_BASED_OUTPATIENT_CLINIC_OR_DEPARTMENT_OTHER): Payer: 59 | Admitting: Oncology

## 2016-05-30 ENCOUNTER — Ambulatory Visit (HOSPITAL_BASED_OUTPATIENT_CLINIC_OR_DEPARTMENT_OTHER): Payer: 59

## 2016-05-30 ENCOUNTER — Encounter: Payer: Self-pay | Admitting: Oncology

## 2016-05-30 VITALS — BP 139/80 | HR 92 | Temp 97.9°F | Resp 18 | Ht 65.5 in | Wt 211.5 lb

## 2016-05-30 DIAGNOSIS — C50411 Malignant neoplasm of upper-outer quadrant of right female breast: Secondary | ICD-10-CM

## 2016-05-30 DIAGNOSIS — Z170 Estrogen receptor positive status [ER+]: Principal | ICD-10-CM

## 2016-05-30 DIAGNOSIS — Z853 Personal history of malignant neoplasm of breast: Secondary | ICD-10-CM

## 2016-05-30 DIAGNOSIS — M05731 Rheumatoid arthritis with rheumatoid factor of right wrist without organ or systems involvement: Secondary | ICD-10-CM | POA: Diagnosis not present

## 2016-05-30 DIAGNOSIS — M8580 Other specified disorders of bone density and structure, unspecified site: Secondary | ICD-10-CM | POA: Diagnosis not present

## 2016-05-30 DIAGNOSIS — M069 Rheumatoid arthritis, unspecified: Secondary | ICD-10-CM | POA: Diagnosis not present

## 2016-05-30 LAB — CBC WITH DIFFERENTIAL/PLATELET
BASO%: 0.4 % (ref 0.0–2.0)
Basophils Absolute: 0 10*3/uL (ref 0.0–0.1)
EOS ABS: 0.2 10*3/uL (ref 0.0–0.5)
EOS%: 3.3 % (ref 0.0–7.0)
HEMATOCRIT: 40.1 % (ref 34.8–46.6)
HGB: 13.4 g/dL (ref 11.6–15.9)
LYMPH#: 2.4 10*3/uL (ref 0.9–3.3)
LYMPH%: 32.8 % (ref 14.0–49.7)
MCH: 29.3 pg (ref 25.1–34.0)
MCHC: 33.4 g/dL (ref 31.5–36.0)
MCV: 87.6 fL (ref 79.5–101.0)
MONO#: 0.5 10*3/uL (ref 0.1–0.9)
MONO%: 6.3 % (ref 0.0–14.0)
NEUT%: 57.2 % (ref 38.4–76.8)
NEUTROS ABS: 4.1 10*3/uL (ref 1.5–6.5)
PLATELETS: 260 10*3/uL (ref 145–400)
RBC: 4.58 10*6/uL (ref 3.70–5.45)
RDW: 13.3 % (ref 11.2–14.5)
WBC: 7.3 10*3/uL (ref 3.9–10.3)

## 2016-05-30 LAB — COMPREHENSIVE METABOLIC PANEL
ALBUMIN: 4.2 g/dL (ref 3.5–5.0)
ALK PHOS: 124 U/L (ref 40–150)
ALT: 33 U/L (ref 0–55)
ANION GAP: 9 meq/L (ref 3–11)
AST: 24 U/L (ref 5–34)
BUN: 13.8 mg/dL (ref 7.0–26.0)
CALCIUM: 9.7 mg/dL (ref 8.4–10.4)
CO2: 26 meq/L (ref 22–29)
CREATININE: 0.8 mg/dL (ref 0.6–1.1)
Chloride: 105 mEq/L (ref 98–109)
EGFR: 83 mL/min/{1.73_m2} — AB (ref >90)
Glucose: 111 mg/dl (ref 70–140)
Potassium: 4.2 mEq/L (ref 3.5–5.1)
Sodium: 141 mEq/L (ref 136–145)
TOTAL PROTEIN: 8.6 g/dL — AB (ref 6.4–8.3)

## 2016-05-30 NOTE — Progress Notes (Signed)
ID: Miranda Orozco   DOB: August 27, 1960  MR#: [id number]  KGU#:[id number]  PCP: Penni Homans, MD GYN: Kendall Flack MD SU: Stark Klein OTHER MD: Bo Merino, Edmonia James   HISTORY OF PRESENT ILLNESS: From the original intake note:  Around November 2010 Miranda Orozco herself palpated what felt like a mass in the upper portion of her right breast. She thought this likely was connected with menstruation but when it persisted into January, she brought it to Dr. Caralee Ates attention and was set up for mammography January 14th,.  Dr. Derrel Nip confirmed a vague palpable focus over the upper outer right breast.  The mammogram was heterogeneously dense with mildly increased density in the upper outer right breast.  There were a few scattered microcalcifications that were not significantly changed.  There was no definite focal abnormality to correspond to the palpable abnormality.  Ultrasound showed a vague hypoechoic area at the 10 o'clock position 5 cm from the nipple, corresponding to the palpable abnormality.    Biopsy of this area was performed January 18th and showed ([id number]) an invasive ductal carcinoma which appeared low grade. There was no evidence of angiolymphatic invasion.  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  The prognostic profile showed the tumor to be ER positive at 84%, PR positive at 85% with a low proliferation marker at 11% and Her-2 negative by CISH with a ratio of 1.04.    The patient was referred to Dr. Barry Dienes and bilateral breast MRIs were obtained January 24th.  This showed a large area of patchy, non-mass like low grade enhancement in the upper outer quadrant of the right breast, extending up to 10.7 cm maximally.  There were no abnormal appearing lymph nodes and no other areas of enhancement suspicious for malignancy in either breast.   The patient's subsequent history is as detailed below.  INTERVAL HISTORY: Miranda Orozco returns today for followup of her  right-sided breast cancer. She has been off tamoxifen now for about 2 months. The reason we went office is that she had a rash that we thought possibly might be related to the drug. However the rash has persisted. It has not changed, although it does wane and wax on its own schedule. She has had this evaluated by dermatology, but so far no biopsy has been obtained   REVIEW OF SYSTEMS: Miranda Orozco still has not had mammography this year, though she tells me this has been scheduled for late November. She never did get a referral to gas trochanter biology placed. She has a history of rheumatoid arthritis but is not being followed for that here and she wonders if that could possibly have anything to do with her rash. 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 She is particularly concerned about the"dreamers'" situation. She is not exercising regularly. She admits to some arthritis pains here and there, but her rheumatoid arthritis is only minimally active if at all. She is anxious but not depressed. She has strange spasms in the right breast area at times. These occur when she coughs or labs and the only way she can relieve them if they don't stop of their own is to bend her body forward. Aside from these issues a detailed review of systems today was stable   PAST MEDICAL HISTORY: Past Medical History:  Diagnosis Date  . Arthritis, rheumatic, acute or subacute 10/02/2009  .  BREAST CANCER, HX OF 10/02/2009  . Disorder of bone and cartilage 10/02/2009   Qualifier: Diagnosis of  By: [name] MD, [name]   . Esophageal reflux 10/02/2009  . Hives 09/18/2014  . Hypoglycemia, unspecified 10/06/2009  . MALIGNANT NEOPLASM OF BREAST UNSPECIFIED SITE 10/02/2009  . Obesity   . OSTEOPENIA 10/02/2009  Significant for a diagnosis of fibroadenoma made by left breast biopsy in June 2007,  a history of C-section times two, a history of surgery for significant left chest wall cellulitis apparently arising from a sebaceous cyst, history of degenerative disk disease and a history of rheumatoid arthritis  PAST SURGICAL HISTORY: Past Surgical History:  Procedure Laterality Date  . BREAST LUMPECTOMY  03/20/10   for margin  . BREAST LUMPECTOMY  03/26/2010   for margin  . [place], 2001  . DILATATION & CURRETTAGE/HYSTEROSCOPY WITH RESECTOCOPE N/A 02/19/2013   Procedure: DILATATION & CURETTAGE/HYSTEROSCOPY WITH RESECTION OF POLYP;  Surgeon: Eldred Manges, MD;  Location: [facility];  Service: Gynecology;  Laterality: N/A;  . MASTECTOMY PARTIAL / LUMPECTOMY W/ AXILLARY LYMPHADENECTOMY  03/12/2010   Right w SLN Dr Margot Chimes  . SIMPLE MASTECTOMY  03/31/2011   For margin    FAMILY HISTORY Family History  Problem Relation Age of Onset  . Arthritis Other   . Diabetes Other   . Hypertension Other   . Hypertension Mother   . Hyperlipidemia Mother   . Hypertension Father   . Diabetes Father   . Cancer Father     lung cancer, smoker  . Colon cancer Neg Hx   . Stomach cancer Neg Hx   The patient's father is alive with has a history of type 2 diabetes, hypertension, and hypercholesterolemia.  The patient's mother is alive, with a history of hypertension.  GYNECOLOGIC HISTORY: She is Gx, P3, first pregnancy age 70.  She was premenopausal at the time of her breast cancer diagnosis.  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  Her husband [name] of course is our Social research officer, government at [employer].  Their children are [name] working as a Therapist, art who is handicapped with an undiagnosed genetic syndrome (has been extensively evaluated) and requires total care.  [name] provided this themselves for many years, but more recently, they have been able to obtain help for about seven hours a day, which is a significant assist for the family].  The  youngest child is Location manager.  The family attends [employer].    ADVANCED DIRECTIVES: Not in place  HEALTH MAINTENANCE: Social History  Substance Use Topics  . Smoking status: Former Smoker    Packs/day: 0.50    Years: 1.00    Types: Cigarettes    Quit date: 10/17/1981  . Smokeless tobacco: Never Used  . Alcohol use 0.0 oz/week    2 - 3 Glasses of wine per week     Colonoscopy: Due  PAP: Up-to-date   Bone density: 04/02/2013, normal  Lipid panel:  No Known Allergies  Current Outpatient Prescriptions  Medication Sig Dispense Refill  . ibuprofen (ADVIL,MOTRIN) 600 MG tablet Ibuprofen 600 mg orally every 6 hours for 3  days, then every 6 hours as needed. For pain 60 tablet 2  . Multiple Vitamins-Minerals (MULTIVITAMIN WITH MINERALS) tablet Take 1 tablet by mouth daily.     No current facility-administered medications for this visit.     OBJECTIVE: Middle-aged white woman Who appears well Vitals:   05/30/16 1307  BP: 139/80  Pulse: 92  Resp: 18  Temp: 97.9 F (36.6 C)     Body mass index is 34.66 kg/m.    ECOG FS: 1  Sclerae unicteric, pupils round and equal Oropharynx clear and moist-- no thrush or other lesions No cervical or supraclavicular adenopathy Lungs no rales or rhonchi Heart regular rate and rhythm Abd soft, nontender, positive bowel sounds MSK no focal spinal tenderness, no upper extremity lymphedema Neuro: nonfocal, well oriented, appropriate affect Breasts: The right breast is status post mastectomy. There is no evidence of chest wall recurrence. The right axilla is benign. Left breast is unremarkable.  LAB RESULTS: Lab Results  Component Value Date   WBC 7.3 05/30/2016   NEUTROABS 4.1 05/30/2016   HGB 13.4 05/30/2016   HCT 40.1 05/30/2016   MCV 87.6 05/30/2016   PLT 260 05/30/2016      Chemistry      Component Value Date/Time   NA 141 05/30/2016 1356   K 4.2 05/30/2016 1356   CL 101 09/08/2014 1210   CO2 26 05/30/2016 1356   BUN 13.8 05/30/2016  1356   CREATININE 0.8 05/30/2016 1356      Component Value Date/Time   CALCIUM 9.7 05/30/2016 1356   ALKPHOS 124 05/30/2016 1356   AST 24 05/30/2016 1356   ALT 33 05/30/2016 1356   BILITOT <0.22 05/30/2016 1356       Lab Results  Component Value Date   LABCA2 21 09/05/2011    No components found for: [id number]  No results for input(s): INR in the last 168 hours.  Urinalysis    Component Value Date/Time   LABSPEC 1.025 03/20/2011 1405   PHURINE 6.0 03/20/2011 1405   PHURINE 5.0 12/31/2010 1121   GLUCOSEU 250 (A) 12/31/2010 1121   HGBUR Moderate 03/20/2011 1405   HGBUR MODERATE (A) 12/31/2010 1121   BILIRUBINUR n 09/10/2013 1608   BILIRUBINUR Negative 03/20/2011 1405   KETONESUR Negative 03/20/2011 1405   KETONESUR NEGATIVE 12/31/2010 1121   PROTEINUR n 09/10/2013 1608   PROTEINUR Negative 03/20/2011 1405   PROTEINUR 30 (A) 12/31/2010 1121   UROBILINOGEN 0.2 09/10/2013 1608   UROBILINOGEN 1.0 12/31/2010 1121   NITRITE n 09/10/2013 1608   NITRITE Negative 03/20/2011 1405   NITRITE POSITIVE (A) 12/31/2010 1121   LEUKOCYTESUR small (1+) 09/10/2013 1608   LEUKOCYTESUR Moderate 03/20/2011 1405    STUDIES: No results found.  ASSESSMENT: 55 y.o. [place] woman status post right breast biopsy January of 2011 for a clinically T3 N0 (stage IIB) invasive ductal carcinoma, grade 1, strongly estrogen and progesterone receptor positive, HER-2 negative, with an MIB-1 of 18%, in the setting of extensive ductal carcinoma in situ;  (1) treated neoadjuvantly with doxorubicin/ cyclophosphamide in dose dense fashion x4 followed by weekly paclitaxel x12   (2) followed by lumpectomy and sentinel lymph node sampling 03/24/2010 showing no residual invasive disease but a 2.8 cm area of ductal carcinoma in situ, with positive margins.  All 4 sentinel lymph nodes were negative.  Margins could not be cleared despite repeated attempts and she underwent definitive mastectomy August of 2011  showing no residual carcinoma.    (3) She completed post mastectomy radiation including the right supraclavicular area in November of 2011   (4) started tamoxifen November 2011, stopped it August 2016 due to concerns regarding a rash.  PLAN:  Salimah continues to be incredibly busy and she continues to put everyone else's Nice before her own. From a breast cancer point of view however she is doing terrific.  She is now more than 6 years out from definitive surgery for her breast cancer with no evidence of disease recurrence. This is very favorable.  We discussed whether she might want to go back on tamoxifen since the rash clearly is not related to it. We discussed the pros and cons. Continuing an additional 5 years might provide a 2 or 3% further reduction in the risk of recurrence. Even though she tolerated it well, this is not a motivated her for her and she is comfortable staying off that medication.  We also discussed discontinuing follow-up here, but she would like to be seen on a once a year basis and I am glad to provide that service.  We then went back to some basic health maintenance issues. She really does need to start an exercise program. She needs a colonoscopy and I have put in a referral for her with Dr. Collene Mares. She should have the rash biopsied so that we can determine exactly what we are dealing with. Possibly could be related to her history of rheumatoid arthritis, although I think this is unlikely.  She requested some rheumatic lab work today which I was glad to draw for her. I also think it would be good if she reestablish yourself with Dr. Idelle Leech and I placed that referral also.  This spasms she feels in the right breast area are going to be related to some nerve entrapment which in some way is related to her right hemidiaphragm, since it is triggered by call for laughter. I don't think pressure or ice or gabapentin would be of much use. She has figured out how to control  it and even though bending forward in the middle of a cocktail party may be awkward that seems to relieve it.  Since her mammogram will be late November this year it will be early December year and so I will see her again a little over a year from now, early December 2018, after next year's mammogram.  Of course I will be glad to see her at any point before that if the need arises.  MAGRINAT,GUSTAV C    05/30/2016

## 2016-05-31 LAB — RHEUMATOID FACTOR: RA Latex Turbid.: 37.6 IU/mL — ABNORMAL HIGH (ref 0.0–13.9)

## 2016-05-31 LAB — SEDIMENTATION RATE: SED RATE: 17 mm/h (ref 0–40)

## 2016-06-03 ENCOUNTER — Encounter: Payer: Self-pay | Admitting: Oncology

## 2016-06-03 LAB — ANTINUCLEAR ANTIBODIES, IFA: ANTINUCLEAR ANTIBODIES, IFA: NEGATIVE

## 2016-06-04 ENCOUNTER — Encounter: Payer: Self-pay | Admitting: Oncology

## 2016-06-05 ENCOUNTER — Encounter: Payer: Self-pay | Admitting: Oncology

## 2016-06-07 ENCOUNTER — Other Ambulatory Visit: Payer: Self-pay | Admitting: Oncology

## 2016-06-07 DIAGNOSIS — Z1389 Encounter for screening for other disorder: Secondary | ICD-10-CM

## 2016-06-18 DIAGNOSIS — Z1211 Encounter for screening for malignant neoplasm of colon: Secondary | ICD-10-CM | POA: Diagnosis not present

## 2016-07-03 DIAGNOSIS — Z853 Personal history of malignant neoplasm of breast: Secondary | ICD-10-CM | POA: Diagnosis not present

## 2016-07-03 DIAGNOSIS — Z1231 Encounter for screening mammogram for malignant neoplasm of breast: Secondary | ICD-10-CM | POA: Diagnosis not present

## 2016-07-03 LAB — HM MAMMOGRAPHY

## 2016-07-04 ENCOUNTER — Encounter: Payer: Self-pay | Admitting: Family Medicine

## 2016-07-12 NOTE — Telephone Encounter (Signed)
Chart reviewed.  MD ordered MM panel 2 days after labs.  Pt has appt with MD this Monday 12/12.  NO further action taken on this encounter.

## 2016-07-16 ENCOUNTER — Other Ambulatory Visit (HOSPITAL_BASED_OUTPATIENT_CLINIC_OR_DEPARTMENT_OTHER): Payer: 59

## 2016-07-16 DIAGNOSIS — M069 Rheumatoid arthritis, unspecified: Secondary | ICD-10-CM

## 2016-07-16 DIAGNOSIS — Z1389 Encounter for screening for other disorder: Secondary | ICD-10-CM

## 2016-07-16 DIAGNOSIS — M8580 Other specified disorders of bone density and structure, unspecified site: Secondary | ICD-10-CM | POA: Diagnosis not present

## 2016-07-16 DIAGNOSIS — C50411 Malignant neoplasm of upper-outer quadrant of right female breast: Secondary | ICD-10-CM

## 2016-07-16 DIAGNOSIS — Z853 Personal history of malignant neoplasm of breast: Secondary | ICD-10-CM | POA: Diagnosis not present

## 2016-07-16 DIAGNOSIS — M05731 Rheumatoid arthritis with rheumatoid factor of right wrist without organ or systems involvement: Secondary | ICD-10-CM

## 2016-07-16 DIAGNOSIS — Z170 Estrogen receptor positive status [ER+]: Principal | ICD-10-CM

## 2016-07-16 LAB — COMPREHENSIVE METABOLIC PANEL
ALBUMIN: 3.8 g/dL (ref 3.5–5.0)
ALK PHOS: 94 U/L (ref 40–150)
ALT: 36 U/L (ref 0–55)
AST: 28 U/L (ref 5–34)
Anion Gap: 8 mEq/L (ref 3–11)
BILIRUBIN TOTAL: 0.25 mg/dL (ref 0.20–1.20)
BUN: 8.8 mg/dL (ref 7.0–26.0)
CO2: 29 mEq/L (ref 22–29)
CREATININE: 0.8 mg/dL (ref 0.6–1.1)
Calcium: 9.3 mg/dL (ref 8.4–10.4)
Chloride: 103 mEq/L (ref 98–109)
EGFR: 84 mL/min/{1.73_m2} — ABNORMAL LOW (ref >90)
GLUCOSE: 101 mg/dL (ref 70–140)
POTASSIUM: 3.8 meq/L (ref 3.5–5.1)
SODIUM: 140 meq/L (ref 136–145)
TOTAL PROTEIN: 7.7 g/dL (ref 6.4–8.3)

## 2016-07-16 LAB — CBC WITH DIFFERENTIAL/PLATELET
BASO%: 0.5 % (ref 0.0–2.0)
Basophils Absolute: 0 10*3/uL (ref 0.0–0.1)
EOS%: 3.9 % (ref 0.0–7.0)
Eosinophils Absolute: 0.3 10*3/uL (ref 0.0–0.5)
HEMATOCRIT: 38 % (ref 34.8–46.6)
HEMOGLOBIN: 12.5 g/dL (ref 11.6–15.9)
LYMPH#: 2.2 10*3/uL (ref 0.9–3.3)
LYMPH%: 27.1 % (ref 14.0–49.7)
MCH: 28.9 pg (ref 25.1–34.0)
MCHC: 32.9 g/dL (ref 31.5–36.0)
MCV: 87.7 fL (ref 79.5–101.0)
MONO#: 0.6 10*3/uL (ref 0.1–0.9)
MONO%: 7.6 % (ref 0.0–14.0)
NEUT%: 60.9 % (ref 38.4–76.8)
NEUTROS ABS: 5 10*3/uL (ref 1.5–6.5)
Platelets: 241 10*3/uL (ref 145–400)
RBC: 4.33 10*6/uL (ref 3.70–5.45)
RDW: 13.8 % (ref 11.2–14.5)
WBC: 8.2 10*3/uL (ref 3.9–10.3)

## 2016-07-19 LAB — MULTIPLE MYELOMA PANEL, SERUM
ALBUMIN SERPL ELPH-MCNC: 3.8 g/dL (ref 2.9–4.4)
Albumin/Glob SerPl: 1.2 (ref 0.7–1.7)
Alpha 1: 0.2 g/dL (ref 0.0–0.4)
Alpha2 Glob SerPl Elph-Mcnc: 0.6 g/dL (ref 0.4–1.0)
B-GLOBULIN SERPL ELPH-MCNC: 1.1 g/dL (ref 0.7–1.3)
Gamma Glob SerPl Elph-Mcnc: 1.6 g/dL (ref 0.4–1.8)
Globulin, Total: 3.4 g/dL (ref 2.2–3.9)
IGM (IMMUNOGLOBIN M), SRM: 83 mg/dL (ref 26–217)
IgA, Qn, Serum: 258 mg/dL (ref 87–352)
IgG, Qn, Serum: 1453 mg/dL (ref 700–1600)
TOTAL PROTEIN: 7.2 g/dL (ref 6.0–8.5)

## 2016-08-05 HISTORY — PX: OTHER SURGICAL HISTORY: SHX169

## 2016-08-30 ENCOUNTER — Telehealth: Payer: Self-pay | Admitting: Family Medicine

## 2016-08-30 ENCOUNTER — Encounter: Payer: Self-pay | Admitting: Family Medicine

## 2016-08-30 DIAGNOSIS — Z1211 Encounter for screening for malignant neoplasm of colon: Secondary | ICD-10-CM | POA: Diagnosis not present

## 2016-08-30 DIAGNOSIS — K5730 Diverticulosis of large intestine without perforation or abscess without bleeding: Secondary | ICD-10-CM | POA: Diagnosis not present

## 2016-08-30 DIAGNOSIS — K5000 Crohn's disease of small intestine without complications: Secondary | ICD-10-CM | POA: Diagnosis not present

## 2016-08-30 LAB — HM COLONOSCOPY

## 2016-08-30 MED ORDER — OSELTAMIVIR PHOSPHATE 75 MG PO CAPS: 75.0000 mg | ORAL_CAPSULE | Freq: Every day | ORAL | 0 refills | Status: DC

## 2016-08-30 NOTE — Telephone Encounter (Signed)
°  Relation to [id number] Call back number:[phone] Pharmacy: [facility] [address], [place], [place] [address] [phone]    Reason for call:  Patient daughter was Dx with the flu and pediatrician advised to call PCP request Tamiflu, please advise mother when Rx is sent in

## 2016-08-30 NOTE — Telephone Encounter (Signed)
Sent in as instructed 

## 2016-08-30 NOTE — Telephone Encounter (Signed)
Tamiflu 75 mg tabs, 1 tab po daily x 10 days, disp #10

## 2016-08-30 NOTE — Telephone Encounter (Signed)
Please advise 

## 2016-09-12 ENCOUNTER — Other Ambulatory Visit: Payer: Self-pay

## 2016-09-12 DIAGNOSIS — L309 Dermatitis, unspecified: Secondary | ICD-10-CM | POA: Diagnosis not present

## 2016-09-12 DIAGNOSIS — D492 Neoplasm of unspecified behavior of bone, soft tissue, and skin: Secondary | ICD-10-CM | POA: Diagnosis not present

## 2017-02-04 DIAGNOSIS — H0011 Chalazion right upper eyelid: Secondary | ICD-10-CM | POA: Diagnosis not present

## 2017-02-04 MED FILL — OLOPATADINE HCL 0.2 % SOLN: 0.2 | 30 days supply | Qty: 3 | Fill #0

## 2017-02-04 MED FILL — DOXYCYCLINE HYC 100 MG TAB: 100 | 14 days supply | Qty: 28 | Fill #0

## 2017-03-11 DIAGNOSIS — H5203 Hypermetropia, bilateral: Secondary | ICD-10-CM | POA: Diagnosis not present

## 2017-07-02 ENCOUNTER — Other Ambulatory Visit (HOSPITAL_BASED_OUTPATIENT_CLINIC_OR_DEPARTMENT_OTHER): Payer: 59

## 2017-07-02 DIAGNOSIS — M05731 Rheumatoid arthritis with rheumatoid factor of right wrist without organ or systems involvement: Secondary | ICD-10-CM

## 2017-07-02 DIAGNOSIS — C50411 Malignant neoplasm of upper-outer quadrant of right female breast: Secondary | ICD-10-CM

## 2017-07-02 DIAGNOSIS — Z853 Personal history of malignant neoplasm of breast: Secondary | ICD-10-CM

## 2017-07-02 DIAGNOSIS — Z170 Estrogen receptor positive status [ER+]: Principal | ICD-10-CM

## 2017-07-02 LAB — COMPREHENSIVE METABOLIC PANEL
ALT: 45 U/L (ref 0–55)
AST: 38 U/L — AB (ref 5–34)
Albumin: 4.3 g/dL (ref 3.5–5.0)
Alkaline Phosphatase: 96 U/L (ref 40–150)
Anion Gap: 8 mEq/L (ref 3–11)
BUN: 14.1 mg/dL (ref 7.0–26.0)
CALCIUM: 9.5 mg/dL (ref 8.4–10.4)
CHLORIDE: 103 meq/L (ref 98–109)
CO2: 28 meq/L (ref 22–29)
CREATININE: 0.8 mg/dL (ref 0.6–1.1)
EGFR: >60 mL/min/{1.73_m2} (ref >60)
Glucose: 85 mg/dl (ref 70–140)
Potassium: 3.9 mEq/L (ref 3.5–5.1)
Sodium: 139 mEq/L (ref 136–145)
TOTAL PROTEIN: 8.2 g/dL (ref 6.4–8.3)
Total Bilirubin: 0.41 mg/dL (ref 0.20–1.20)

## 2017-07-02 LAB — CBC WITH DIFFERENTIAL/PLATELET
BASO%: 0.5 % (ref 0.0–2.0)
BASOS ABS: 0 10*3/uL (ref 0.0–0.1)
EOS ABS: 0.3 10*3/uL (ref 0.0–0.5)
EOS%: 4.4 % (ref 0.0–7.0)
HEMATOCRIT: 40.5 % (ref 34.8–46.6)
HEMOGLOBIN: 13.3 g/dL (ref 11.6–15.9)
LYMPH%: 30.8 % (ref 14.0–49.7)
MCH: 29.6 pg (ref 25.1–34.0)
MCHC: 32.8 g/dL (ref 31.5–36.0)
MCV: 90 fL (ref 79.5–101.0)
MONO#: 0.5 10*3/uL (ref 0.1–0.9)
MONO%: 8.3 % (ref 0.0–14.0)
NEUT%: 56 % (ref 38.4–76.8)
NEUTROS ABS: 3.5 10*3/uL (ref 1.5–6.5)
Platelets: 255 10*3/uL (ref 145–400)
RBC: 4.5 10*6/uL (ref 3.70–5.45)
RDW: 13.5 % (ref 11.2–14.5)
WBC: 6.3 10*3/uL (ref 3.9–10.3)
lymph#: 1.9 10*3/uL (ref 0.9–3.3)

## 2017-07-07 ENCOUNTER — Encounter: Payer: Self-pay | Admitting: Oncology

## 2017-07-08 NOTE — Progress Notes (Signed)
ID: Miranda Orozco   DOB: 1961-06-06  MR#: [id number]  HMC#:[id number]  PCP: Mosie Lukes, MD GYN: Kendall Flack MD SU: Stark Klein OTHER MD: Bo Merino, Edmonia James  CHIEF COMPLAINT: Estrogen receptor positive breast cancer  CURRENT TREATMENT: Observation   HISTORY OF PRESENT ILLNESS: From the original intake note:  Around November 2010 Miranda Orozco herself palpated what felt like a mass in the upper portion of her right breast. She thought this likely was connected with menstruation but when it persisted into January, she brought it to Dr. Caralee Ates attention and was set up for mammography January 14th,.  Dr. Derrel Nip confirmed a vague palpable focus over the upper outer right breast.  The mammogram was heterogeneously dense with mildly increased density in the upper outer right breast.  There were a few scattered microcalcifications that were not significantly changed.  There was no definite focal abnormality to correspond to the palpable abnormality.  Ultrasound showed a vague hypoechoic area at the 10 o'clock position 5 cm from the nipple, corresponding to the palpable abnormality.    Biopsy of this area was performed January 18th and showed ([id number]) an invasive ductal carcinoma which appeared low grade. There was no evidence of angiolymphatic invasion.  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  The prognostic profile showed the tumor to be ER positive at 84%, PR positive at 85% with a low proliferation marker at 11% and Her-2 negative by CISH with a ratio of 1.04.    The patient was referred to Dr. Barry Dienes and bilateral breast MRIs were obtained January 24th.  This showed a large area of patchy, non-mass like low grade enhancement in the upper outer quadrant of the right breast, extending up to 10.7 cm maximally.  There were no abnormal appearing lymph nodes and no other areas of enhancement suspicious for malignancy in either breast.   The patient's  subsequent history is as detailed below.  INTERVAL HISTORY: Miranda Orozco returns today for follow-up and treatment of her estrogen receptor positive breast cancer.  She continues on observation alone.  Since her last visit here she had her yearly left mammogram at [facility], on 1128/2018, showing the breast density to be category C.  She continues to have sharp pain in the right chest wall which is very intermittent, perhaps every other day, lasting 1-2 minutes and feels like an ice pick, or like the ice cream headache that people get when eating very cold ice cream.  She gets a little relief by leaning forward when this happens.  She had colonoscopy under Dr. Collene Mares approximately 6 months ago.   REVIEW OF SYSTEMS: Miranda Orozco continues to be very active currently in [place] issues.  She is not exercising regularly, although she takes brief walks occasionally.  She is trying to improve on her diet.  A detailed review of systems today was otherwise stable   PAST MEDICAL HISTORY: Past Medical History:  Diagnosis Date  . Arthritis, rheumatic, acute or subacute 10/02/2009  . BREAST CANCER, HX OF 10/02/2009  . Disorder of bone and cartilage 10/02/2009   Qualifier: Diagnosis of  By: [name] MD, [name]   . Esophageal reflux 10/02/2009  . Hives 09/18/2014  . Hypoglycemia, unspecified 10/06/2009  . MALIGNANT NEOPLASM OF BREAST UNSPECIFIED SITE 10/02/2009  . Obesity   . OSTEOPENIA 10/02/2009  Significant for a diagnosis of fibroadenoma made by left breast biopsy in June 2007, a history of C-section times two, a history of surgery for significant left chest wall cellulitis  apparently arising from a sebaceous cyst, history of degenerative disk disease and a history of rheumatoid arthritis  PAST SURGICAL HISTORY: Past Surgical History:  Procedure Laterality Date  . BREAST LUMPECTOMY  03/20/10   for margin  . BREAST LUMPECTOMY  03/26/2010   for margin  . [place], 2001  . DILATATION &  CURRETTAGE/HYSTEROSCOPY WITH RESECTOCOPE N/A 02/19/2013   Procedure: DILATATION & CURETTAGE/HYSTEROSCOPY WITH RESECTION OF POLYP;  Surgeon: Eldred Manges, MD;  Location: [facility];  Service: Gynecology;  Laterality: N/A;  . MASTECTOMY PARTIAL / LUMPECTOMY W/ AXILLARY LYMPHADENECTOMY  03/12/2010   Right w SLN Dr Margot Chimes  . SIMPLE MASTECTOMY  03/31/2011   For margin    FAMILY HISTORY Family History  Problem Relation Age of Onset  . Arthritis Other   . Diabetes Other   . Hypertension Other   . Hypertension Mother   . Hyperlipidemia Mother   . Hypertension Father   . Diabetes Father   . Cancer Father        lung cancer, smoker  . Colon cancer Neg Hx   . Stomach cancer Neg Hx   The patient's father is alive with has a history of type 2 diabetes, hypertension, and hypercholesterolemia.  The patient's mother is alive, with a history of hypertension.  GYNECOLOGIC HISTORY: She is Gx, P3, first pregnancy age 72.  She was premenopausal at the time of her breast cancer diagnosis.  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  Her husband [name] of course is our Social research officer, government at [employer].  Their children are Pailynn who has a teaching degree but is currently running a yoga clothing shop in [place], Winslow who is handicapped with an usual genetic syndrome and requires total care.  And [name] 17, is going to be going to community college in 2019. [name] is Location manager.  The family attends [employer].    ADVANCED DIRECTIVES: Not in place  HEALTH MAINTENANCE: Social History   Tobacco Use  . Smoking status: Former Smoker    Packs/day: 0.50    Years: 1.00    Pack years: 0.50    Types: Cigarettes    Last attempt to quit: 10/17/1981    Years since quitting: 35.7  . Smokeless tobacco: Never Used  Substance Use Topics  . Alcohol use: Yes    Alcohol/week: 0.0 oz    Types: 2 - 3 Glasses of wine per week  . Drug use: No      Colonoscopy:  May 2018/[facility]  PAP: Up-to-date   Bone density: 04/02/2013, normal  Lipid panel:  No Known Allergies  Current Outpatient Medications  Medication Sig Dispense Refill  . hydrOXYzine (ATARAX/VISTARIL) 10 MG tablet Take 1 tablet (10 mg total) by mouth at bedtime as needed. 30 tablet 4  . ibuprofen (ADVIL,MOTRIN) 600 MG tablet Ibuprofen 600 mg orally every 6 hours for 3  days, then every 6 hours as needed. For pain 60 tablet 2  . Multiple Vitamins-Minerals (MULTIVITAMIN WITH MINERALS) tablet Take 1 tablet by mouth daily.    Marland Kitchen oseltamivir (TAMIFLU) 75 MG capsule Take 1 capsule (75 mg total) by mouth daily. 10 capsule 0  . predniSONE (DELTASONE) 5 MG tablet Take 6 tablets day 1, 5 tablets day 2, 4 tablets day 3, 3 tablets day 4, 2 tablets day 5 and one tablet day 6 21 tablet 0   No current facility-administered medications for this visit.  OBJECTIVE: Middle-aged white woman in no acute distress  Vitals:   07/09/17 1056  BP: 121/85  Pulse: 77  Resp: 20  Temp: 98.6 F (37 C)  SpO2: 97%     Body mass index is 33.89 kg/m.    ECOG FS: 1 Filed Weights   07/09/17 1056  Weight: 206 lb 12.8 oz (93.8 kg)    Sclerae unicteric, EOMs intact Oropharynx clear and moist No cervical or supraclavicular adenopathy Lungs no rales or rhonchi Heart regular rate and rhythm Abd soft, nontender, positive bowel sounds MSK no focal spinal tenderness, no upper extremity lymphedema Neuro: nonfocal, well oriented, appropriate affect Breasts: The right breast is status post mastectomy.  There is no evidence of chest wall recurrence.  The left breast is unremarkable.  Both axillae are benign.  LAB RESULTS: Lab Results  Component Value Date   WBC 6.3 07/02/2017   NEUTROABS 3.5 07/02/2017   HGB 13.3 07/02/2017   HCT 40.5 07/02/2017   MCV 90.0 07/02/2017   PLT 255 07/02/2017      Chemistry      Component Value Date/Time   NA 139 07/02/2017 1211   K 3.9 07/02/2017 1211   CL  101 09/08/2014 1210   CO2 28 07/02/2017 1211   BUN 14.1 07/02/2017 1211   CREATININE 0.8 07/02/2017 1211      Component Value Date/Time   CALCIUM 9.5 07/02/2017 1211   ALKPHOS 96 07/02/2017 1211   AST 38 (H) 07/02/2017 1211   ALT 45 07/02/2017 1211   BILITOT 0.41 07/02/2017 1211       Lab Results  Component Value Date   LABCA2 21 09/05/2011    No components found for: [id number]  No results for input(s): INR in the last 168 hours.  Urinalysis    Component Value Date/Time   LABSPEC 1.025 03/20/2011 1405   PHURINE 6.0 03/20/2011 1405   PHURINE 5.0 12/31/2010 1121   GLUCOSEU 250 (A) 12/31/2010 1121   HGBUR Moderate 03/20/2011 1405   HGBUR MODERATE (A) 12/31/2010 1121   BILIRUBINUR n 09/10/2013 1608   BILIRUBINUR Negative 03/20/2011 1405   KETONESUR Negative 03/20/2011 1405   KETONESUR NEGATIVE 12/31/2010 1121   PROTEINUR n 09/10/2013 1608   PROTEINUR Negative 03/20/2011 1405   PROTEINUR 30 (A) 12/31/2010 1121   UROBILINOGEN 0.2 09/10/2013 1608   UROBILINOGEN 1.0 12/31/2010 1121   NITRITE n 09/10/2013 1608   NITRITE Negative 03/20/2011 1405   NITRITE POSITIVE (A) 12/31/2010 1121   LEUKOCYTESUR small (1+) 09/10/2013 1608   LEUKOCYTESUR Moderate 03/20/2011 1405    STUDIES: Left mammography at [facility] November 20 18- as noted  ASSESSMENT: 56 y.o. [place] woman status post right breast biopsy January of 2011 for a clinically T3 N0 (stage IIB) invasive ductal carcinoma, grade 1, strongly estrogen and progesterone receptor positive, HER-2 negative, with an MIB-1 of 18%, in the setting of extensive ductal carcinoma in situ;  (1) treated neoadjuvantly with doxorubicin/ cyclophosphamide in dose dense fashion x4 followed by weekly paclitaxel x12   (2) followed by lumpectomy and sentinel lymph node sampling 03/24/2010 showing no residual invasive disease but a 2.8 cm area of ductal carcinoma in situ, with positive margins.  All 4 sentinel lymph nodes were negative.  Margins  could not be cleared despite repeated attempts and she underwent definitive mastectomy August of 2011 showing no residual carcinoma.    (3) She completed post mastectomy radiation including the right supraclavicular area in November of 2011   (4) started tamoxifen November 2011, stopped it  August 2016 due to concerns regarding a rash.  PLAN:  Miranda Orozco is now a little over 7 years out from definitive surgery for her breast cancer with no evidence of disease recurrence.  This is very favorable.  She understands that the pain she sometimes has in the right chest wall is not so much cancer related as cancer treatment related.  She is managing that as well as one might.  She continues to have the rash which is the reason we stopped the tamoxifen.  Of course the rash did not get any better by stopping tamoxifen.  She has tried a steroid cream with no significant success.  She really needs to get back to dermatology and tells me she will see Dr. Denna Haggard sometime in the next few months.  She has a history of rheumatoid arthritis and she had an elevated rheumatoid factor last time we checked it although the sed rate was normal.  She has been treated with Plaquenil in the past..  She is very troubled by the itching.  I am putting her on a Medrol Dosepak and I am also writing her for Atarax to take at bedtime as needed.  However it would be best if she established herself with a regular rheumatologist here in town.  From a breast cancer point of view she is going to see me again in 1 knows to call for any problems that may develop before that visit.   Magrinat, Virgie Dad, MD  07/09/17 11:42 AM Medical Oncology and Hematology [facility] [address] [place], [place] [address] Tel. [phone]    Fax. [phone]  This document serves as a record of services personally performed by Lurline Del, MD. It was created on his behalf by Sheron Nightingale, a trained medical scribe. The creation  of this record is based on the scribe's personal observations and the provider's statements to them.   I have reviewed the above documentation for accuracy and completeness, and I agree with the above.

## 2017-07-09 ENCOUNTER — Telehealth: Payer: Self-pay | Admitting: Oncology

## 2017-07-09 ENCOUNTER — Ambulatory Visit (HOSPITAL_BASED_OUTPATIENT_CLINIC_OR_DEPARTMENT_OTHER): Payer: 59 | Admitting: Oncology

## 2017-07-09 VITALS — BP 121/85 | HR 77 | Temp 98.6°F | Resp 20 | Wt 206.8 lb

## 2017-07-09 DIAGNOSIS — R0789 Other chest pain: Secondary | ICD-10-CM | POA: Diagnosis not present

## 2017-07-09 DIAGNOSIS — M069 Rheumatoid arthritis, unspecified: Secondary | ICD-10-CM | POA: Diagnosis not present

## 2017-07-09 DIAGNOSIS — Z853 Personal history of malignant neoplasm of breast: Secondary | ICD-10-CM | POA: Diagnosis not present

## 2017-07-09 DIAGNOSIS — Z170 Estrogen receptor positive status [ER+]: Principal | ICD-10-CM

## 2017-07-09 DIAGNOSIS — R21 Rash and other nonspecific skin eruption: Secondary | ICD-10-CM

## 2017-07-09 DIAGNOSIS — C50411 Malignant neoplasm of upper-outer quadrant of right female breast: Secondary | ICD-10-CM

## 2017-07-09 MED ORDER — PREDNISONE 5 MG PO TABS: ORAL_TABLET | ORAL | 0 refills | Status: DC

## 2017-07-09 MED ORDER — HYDROXYZINE HCL 10 MG PO TABS: 10.0000 mg | ORAL_TABLET | Freq: Every evening | ORAL | 4 refills | Status: DC | PRN

## 2017-07-09 MED FILL — hydrOXYzine HCL 10 MG TABS: 10 | 30 days supply | Qty: 30 | Fill #0

## 2017-07-09 MED FILL — predniSONE 5 MG TABS: 5 | 6 days supply | Qty: 21 | Fill #0

## 2017-07-09 NOTE — Telephone Encounter (Signed)
Gave patient AVS and calendar of upcoming November and December 2019 appointments

## 2017-10-17 ENCOUNTER — Encounter: Payer: Self-pay | Admitting: Family Medicine

## 2017-12-16 ENCOUNTER — Ambulatory Visit (INDEPENDENT_AMBULATORY_CARE_PROVIDER_SITE_OTHER): Payer: 59 | Admitting: Family Medicine

## 2017-12-16 ENCOUNTER — Encounter: Payer: Self-pay | Admitting: Family Medicine

## 2017-12-16 VITALS — BP 102/82 | HR 73 | Temp 98.0°F | Resp 20 | Ht 66.0 in | Wt 214.0 lb

## 2017-12-16 DIAGNOSIS — R002 Palpitations: Secondary | ICD-10-CM | POA: Diagnosis not present

## 2017-12-16 DIAGNOSIS — Z0000 Encounter for general adult medical examination without abnormal findings: Secondary | ICD-10-CM

## 2017-12-16 DIAGNOSIS — E782 Mixed hyperlipidemia: Secondary | ICD-10-CM | POA: Diagnosis not present

## 2017-12-16 DIAGNOSIS — M05731 Rheumatoid arthritis with rheumatoid factor of right wrist without organ or systems involvement: Secondary | ICD-10-CM

## 2017-12-16 DIAGNOSIS — R0789 Other chest pain: Secondary | ICD-10-CM | POA: Diagnosis not present

## 2017-12-16 DIAGNOSIS — E669 Obesity, unspecified: Secondary | ICD-10-CM | POA: Diagnosis not present

## 2017-12-16 DIAGNOSIS — R21 Rash and other nonspecific skin eruption: Secondary | ICD-10-CM

## 2017-12-16 NOTE — Progress Notes (Signed)
Subjective:  I acted as a Education administrator for Dr. Charlett Blake. [place], [place]  Patient ID: Miranda Orozco, female    DOB: 09-10-1960, 57 y.o.   MRN: [id number]  No chief complaint on file.   HPI  Patient is in today for an annual exam and follow up on chronic medical concerns including rheumatoid arthritis, obesity, anemia and more. She is noting an increase in palpitations and she has bgun to notice some intermittent chest discomfort wihtout associated symptoms. No recent febrile illness or hospitalizations. Denies SOB/HA/congestion/fevers/GI or GU c/o. Taking meds as prescribed. She is noting increased fatigue palpitations at times. She has been under a great deal of stress caring for her son with special needs. She is also endorsing some episodes of chest discomfort without associated symptoms. No pattern as to when they occur. She has had increased joint pain, fatigue and rash  Patient Care Team: Mosie Lukes, MD as PCP - General (Family Medicine) Neldon Mc, MD (General Surgery) Magrinat, Virgie Dad, MD (Hematology and Oncology) Leo Grosser, Seymour Bars, MD (Obstetrics and Gynecology) Bo Merino, MD (Rheumatology) Juanita Craver, MD as Consulting Physician (Gastroenterology)   Past Medical History:  Diagnosis Date  . Arthritis, rheumatic, acute or subacute 10/02/2009  . BREAST CANCER, HX OF 10/02/2009  . Disorder of bone and cartilage 10/02/2009   Qualifier: Diagnosis of  By: [name] MD, [name]   . Esophageal reflux 10/02/2009  . Hives 09/18/2014  . Hypoglycemia, unspecified 10/06/2009  . MALIGNANT NEOPLASM OF BREAST UNSPECIFIED SITE 10/02/2009  . Obesity   . OSTEOPENIA 10/02/2009    Past Surgical History:  Procedure Laterality Date  . BREAST LUMPECTOMY  03/20/10   for margin  . BREAST LUMPECTOMY  03/26/2010   for margin  . [place], 2001  . DILATATION & CURRETTAGE/HYSTEROSCOPY WITH RESECTOCOPE N/A 02/19/2013   Procedure: DILATATION & CURETTAGE/HYSTEROSCOPY WITH RESECTION  OF POLYP;  Surgeon: Eldred Manges, MD;  Location: [facility];  Service: Gynecology;  Laterality: N/A;  . MASTECTOMY PARTIAL / LUMPECTOMY W/ AXILLARY LYMPHADENECTOMY  03/12/2010   Right w SLN Dr Margot Chimes  . SIMPLE MASTECTOMY  03/31/2011   For margin    Family History  Problem Relation Age of Onset  . Hypertension Mother   . Hyperlipidemia Mother   . Hypertension Father   . Diabetes Father   . Cancer Father        lung cancer, smoker  . Arthritis Other   . Diabetes Other   . Hypertension Other   . Colon cancer Neg Hx   . Stomach cancer Neg Hx     Social History   Socioeconomic History  . Marital status: Married    Spouse name: Not on file  . Number of children: Not on file  . Years of education: Not on file  . Highest education level: Not on file  Occupational History  . Not on file  Social Needs  . Financial resource strain: Not on file  . Food insecurity:    Worry: Not on file    Inability: Not on file  . Transportation needs:    Medical: Not on file    Non-medical: Not on file  Tobacco Use  . Smoking status: Former Smoker    Packs/day: 0.50    Years: 1.00    Pack years: 0.50    Types: Cigarettes    Last attempt to quit: 10/17/1981    Years since quitting: 36.2  . Smokeless tobacco: Never Used  Substance and  Sexual Activity  . Alcohol use: Yes    Alcohol/week: 0.0 oz    Types: 2 - 3 Glasses of wine per week  . Drug use: No  . Sexual activity: Yes    Birth control/protection: None    Comment: lives with husband and children, no dietary restriction  Lifestyle  . Physical activity:    Days per week: Not on file    Minutes per session: Not on file  . Stress: Not on file  Relationships  . Social connections:    Talks on phone: Not on file    Gets together: Not on file    Attends religious service: Not on file    Active member of club or organization: Not on file    Attends meetings of clubs or organizations: Not on file    Relationship status: Not on file  .  Intimate partner violence:    Fear of current or ex partner: Not on file    Emotionally abused: Not on file    Physically abused: Not on file    Forced sexual activity: Not on file  Other Topics Concern  . Not on file  Social History Narrative  . Not on file    Outpatient Medications Prior to Visit  Medication Sig Dispense Refill  . hydrOXYzine (ATARAX/VISTARIL) 10 MG tablet Take 1 tablet (10 mg total) by mouth at bedtime as needed. 30 tablet 4  . ibuprofen (ADVIL,MOTRIN) 600 MG tablet Ibuprofen 600 mg orally every 6 hours for 3  days, then every 6 hours as needed. For pain 60 tablet 2  . Multiple Vitamins-Minerals (MULTIVITAMIN WITH MINERALS) tablet Take 1 tablet by mouth daily.    . predniSONE (DELTASONE) 5 MG tablet Take 6 tablets day 1, 5 tablets day 2, 4 tablets day 3, 3 tablets day 4, 2 tablets day 5 and one tablet day 6 21 tablet 0  . oseltamivir (TAMIFLU) 75 MG capsule Take 1 capsule (75 mg total) by mouth daily. 10 capsule 0   No facility-administered medications prior to visit.     No Known Allergies  Review of Systems  Constitutional: Negative for fever and malaise/fatigue.  HENT: Negative for congestion.   Eyes: Negative for blurred vision.  Respiratory: Negative for shortness of breath.   Cardiovascular: Positive for chest pain and palpitations. Negative for leg swelling.  Gastrointestinal: Negative for abdominal pain, blood in stool and nausea.  Genitourinary: Positive for hematuria. Negative for dysuria and frequency.  Musculoskeletal: Positive for joint pain. Negative for falls.  Skin: Positive for rash.  Neurological: Negative for dizziness, loss of consciousness and headaches.  Endo/Heme/Allergies: Negative for environmental allergies.  Psychiatric/Behavioral: Negative for depression. The patient is not nervous/anxious.        Objective:    Physical Exam  Constitutional: She is oriented to person, place, and time. No distress.  HENT:  Head: Normocephalic  and atraumatic.  Eyes: Conjunctivae are normal.  Neck: Neck supple. No thyromegaly present.  Cardiovascular: Normal rate, regular rhythm and normal heart sounds.  No murmur heard. Pulmonary/Chest: Effort normal and breath sounds normal. She has no wheezes.  Abdominal: She exhibits no distension and no mass.  Musculoskeletal: She exhibits no edema.  Lymphadenopathy:    She has no cervical adenopathy.  Neurological: She is alert and oriented to person, place, and time.  Skin: Skin is warm and dry. Rash noted. She is not diaphoretic.  Posterior neck  Psychiatric: Judgment normal.    BP 102/82 (BP Location: Left Arm, Patient Position:  Sitting, Cuff Size: Normal)   Pulse 73   Temp 98 F (36.7 C) (Oral)   Resp 20   Ht _0  (1.676 m)   Wt 214 lb (97.1 kg)   SpO2 98%   BMI 34.54 kg/m  Wt Readings from Last 3 Encounters:  12/16/17 214 lb (97.1 kg)  07/09/17 206 lb 12.8 oz (93.8 kg)  05/30/16 211 lb 8 oz (95.9 kg)   BP Readings from Last 3 Encounters:  12/16/17 102/82  07/09/17 121/85  05/30/16 139/80     Immunization History  Administered Date(s) Administered  . Influenza,inj,Quad PF,6+ Mos 05/17/2013  . Influenza-Unspecified 04/19/2014  . Td 08/05/2008    Health Maintenance  Topic Date Due  . Hepatitis C Screening  04-Feb-1961  . HIV Screening  08/23/1976  . COLONOSCOPY  08/24/2011  . PAP SMEAR  11/18/2014  . INFLUENZA VACCINE  03/05/2018  . MAMMOGRAM  07/03/2018  . TETANUS/TDAP  08/05/2018    Lab Results  Component Value Date   WBC 7.6 12/16/2017   HGB 13.2 12/16/2017   HCT 39.8 12/16/2017   PLT 273.0 12/16/2017   GLUCOSE 110 (H) 12/16/2017   CHOL 240 (H) 12/16/2017   TRIG 360.0 (H) 12/16/2017   HDL 43.60 12/16/2017   LDLDIRECT 126.0 12/16/2017   ALT 29 12/16/2017   AST 24 12/16/2017   NA 140 12/16/2017   K 4.2 12/16/2017   CL 100 12/16/2017   CREATININE 0.79 12/16/2017   BUN 17 12/16/2017   CO2 29 12/16/2017   TSH 3.16 12/16/2017   HGBA1C 6.2  12/18/2017    Lab Results  Component Value Date   TSH 3.16 12/16/2017   Lab Results  Component Value Date   WBC 7.6 12/16/2017   HGB 13.2 12/16/2017   HCT 39.8 12/16/2017   MCV 89.3 12/16/2017   PLT 273.0 12/16/2017   Lab Results  Component Value Date   NA 140 12/16/2017   K 4.2 12/16/2017   CHLORIDE 103 07/02/2017   CO2 29 12/16/2017   GLUCOSE 110 (H) 12/16/2017   BUN 17 12/16/2017   CREATININE 0.79 12/16/2017   BILITOT 0.3 12/16/2017   ALKPHOS 97 12/16/2017   AST 24 12/16/2017   ALT 29 12/16/2017   PROT 8.1 12/16/2017   ALBUMIN 4.5 12/16/2017   CALCIUM 9.7 12/16/2017   ANIONGAP 8 07/02/2017   EGFR >60 07/02/2017   GFR 79.92 12/16/2017   Lab Results  Component Value Date   CHOL 240 (H) 12/16/2017   Lab Results  Component Value Date   HDL 43.60 12/16/2017   No results found for: [facility] Lab Results  Component Value Date   TRIG 360.0 (H) 12/16/2017   Lab Results  Component Value Date   CHOLHDL 6 12/16/2017   Lab Results  Component Value Date   HGBA1C 6.2 12/18/2017         Assessment & Plan:   Problem List Items Addressed This Visit    Obesity (BMI 30.0-34.9) - Primary    Encouraged DASH diet, decrease po intake and increase exercise as tolerated. Needs 7-8 hours of sleep nightly. Avoid trans fats, eat small, frequent meals every 4-5 hours with lean proteins, complex carbs and healthy fats. Minimize simple carbs, GMO foods. Referred to bariatric program      Relevant Orders   Amb Ref to Medical Weight Management   Rash    Posterior neck, posterior knees, flexor surfaces of elbows.  Try Zyrtec and Zantac twice a day and cleanse the area with witch  hazel astringent and is using a steroid cream as needed referred to rheumatology for further consideration. Has seen dermatology in past with no definitive help.       Preventative health care    Patient encouraged to maintain heart healthy diet, regular exercise, adequate sleep. Consider daily  probiotics. Take medications as prescribed      Relevant Orders   CBC (Completed)   Comprehensive metabolic panel (Completed)   TSH (Completed)   Hyperlipidemia, mixed    Encouraged heart healthy diet, increase exercise, avoid trans fats, consider a krill oil cap daily      Relevant Orders   Lipid panel (Completed)   Rheumatoid arthritis ([place])    Repeat rheumatoid factor and refer to rheumatology if stil elevated      Relevant Orders   Ambulatory referral to Rheumatology   Atypical chest pain    EKG showed NSR, echo ordered and she is aware to seek care if pain returns and does not remit.       Relevant Orders   ECHOCARDIOGRAM COMPLETE   EKG 12-Lead (Completed)   Palpitation   Relevant Orders   ECHOCARDIOGRAM COMPLETE   EKG 12-Lead (Completed)   CBC (Completed)   Comprehensive metabolic panel (Completed)   TSH (Completed)      I have discontinued Carina Kiner's oseltamivir. I am also having her maintain her multivitamin with minerals, ibuprofen, hydrOXYzine, and predniSONE.  No orders of the defined types were placed in this encounter.   CMA served as Education administrator during this visit. History, Physical and Plan performed by medical provider. Documentation and orders reviewed and attested to.  Penni Homans, MD

## 2017-12-16 NOTE — Assessment & Plan Note (Signed)
Repeat rheumatoid factor and refer to rheumatology if stil elevated

## 2017-12-16 NOTE — Assessment & Plan Note (Signed)
Encouraged heart healthy diet, increase exercise, avoid trans fats, consider a krill oil cap daily 

## 2017-12-16 NOTE — Assessment & Plan Note (Signed)
Posterior neck, posterior knees, flexor surfaces of elbows.  Try Zyrtec and Zantac twice a day and cleanse the area with witch hazel astringent and is using a steroid cream as needed referred to rheumatology for further consideration. Has seen dermatology in past with no definitive help.

## 2017-12-16 NOTE — Assessment & Plan Note (Signed)
Encouraged DASH diet, decrease po intake and increase exercise as tolerated. Needs 7-8 hours of sleep nightly. Avoid trans fats, eat small, frequent meals every 4-5 hours with lean proteins, complex carbs and healthy fats. Minimize simple carbs, GMO foods. Referred to bariatric program

## 2017-12-16 NOTE — Patient Instructions (Addendum)
Zyrtec/Cetirizine 10 mg twice daily Zantac/Ranitidine 150 mg twice daily Topical Witch Hazel Astringent to cleanse the area.   Try mylanta if you get a chest pain after eating.   shingrix is the new shingles shot. 2 shots over 2-6 months at pharmacy or at office Sometimes can get at [facility].   Preventive Care 40-64 Years, Female Preventive care refers to lifestyle choices and visits with your health care provider that can promote health and wellness. What does preventive care include?  A yearly physical exam. This is also called an annual well check.  Dental exams once or twice a year.  Routine eye exams. Ask your health care provider how often you should have your eyes checked.  Personal lifestyle choices, including: ? Daily care of your teeth and gums. ? Regular physical activity. ? Eating a healthy diet. ? Avoiding tobacco and drug use. ? Limiting alcohol use. ? Practicing safe sex. ? Taking low-dose aspirin daily starting at age 29. ? Taking vitamin and mineral supplements as recommended by your health care provider. What happens during an annual well check? The services and screenings done by your health care provider during your annual well check will depend on your age, overall health, lifestyle risk factors, and family history of disease. Counseling Your health care provider may ask you questions about your:  Alcohol use.  Tobacco use.  Drug use.  Emotional well-being.  Home and relationship well-being.  Sexual activity.  Eating habits.  Work and work Statistician.  Method of birth control.  Menstrual cycle.  Pregnancy history.  Screening You may have the following tests or measurements:  Height, weight, and BMI.  Blood pressure.  Lipid and cholesterol levels. These may be checked every 5 years, or more frequently if you are over 20 years old.  Skin check.  Lung cancer screening. You may have this screening every year starting  at age 57 if you have a 30-pack-year history of smoking and currently smoke or have quit within the past 15 years.  Fecal occult blood test (FOBT) of the stool. You may have this test every year starting at age 33.  Flexible sigmoidoscopy or colonoscopy. You may have a sigmoidoscopy every 5 years or a colonoscopy every 10 years starting at age 25.  Hepatitis C blood test.  Hepatitis B blood test.  Sexually transmitted disease (STD) testing.  Diabetes screening. This is done by checking your blood sugar (glucose) after you have not eaten for a while (fasting). You may have this done every 1-3 years.  Mammogram. This may be done every 1-2 years. Talk to your health care provider about when you should start having regular mammograms. This may depend on whether you have a family history of breast cancer.  BRCA-related cancer screening. This may be done if you have a family history of breast, ovarian, tubal, or peritoneal cancers.  Pelvic exam and Pap test. This may be done every 3 years starting at age 58. Starting at age 81, this may be done every 5 years if you have a Pap test in combination with an HPV test.  Bone density scan. This is done to screen for osteoporosis. You may have this scan if you are at high risk for osteoporosis.  Discuss your test results, treatment options, and if necessary, the need for more tests with your health care provider. Vaccines Your health care provider may recommend certain vaccines, such as:  Influenza vaccine. This is recommended every year.  Tetanus, diphtheria, and acellular  pertussis (Tdap, Td) vaccine. You may need a Td booster every 10 years.  Varicella vaccine. You may need this if you have not been vaccinated.  Zoster vaccine. You may need this after age 22.  Measles, mumps, and rubella (MMR) vaccine. You may need at least one dose of MMR if you were born in 1957 or later. You may also need a second dose.  Pneumococcal 13-valent conjugate  (PCV13) vaccine. You may need this if you have certain conditions and were not previously vaccinated.  Pneumococcal polysaccharide (PPSV23) vaccine. You may need one or two doses if you smoke cigarettes or if you have certain conditions.  Meningococcal vaccine. You may need this if you have certain conditions.  Hepatitis A vaccine. You may need this if you have certain conditions or if you travel or work in places where you may be exposed to hepatitis A.  Hepatitis B vaccine. You may need this if you have certain conditions or if you travel or work in places where you may be exposed to hepatitis B.  Haemophilus influenzae type b (Hib) vaccine. You may need this if you have certain conditions.  Talk to your health care provider about which screenings and vaccines you need and how often you need them. This information is not intended to replace advice given to you by your health care provider. Make sure you discuss any questions you have with your health care provider. Document Released: 08/18/2015 Document Revised: 04/10/2016 Document Reviewed: 05/23/2015 Elsevier Interactive Patient Education  [employer].

## 2017-12-17 LAB — LIPID PANEL
Cholesterol: 240 mg/dL — ABNORMAL HIGH (ref 0–200)
HDL: 43.6 mg/dL (ref >39.00)
NONHDL: 196.23
Total CHOL/HDL Ratio: 6
Triglycerides: 360 mg/dL — ABNORMAL HIGH (ref 0.0–149.0)
VLDL: 72 mg/dL — ABNORMAL HIGH (ref 0.0–40.0)

## 2017-12-17 LAB — CBC
HCT: 39.8 % (ref 36.0–46.0)
Hemoglobin: 13.2 g/dL (ref 12.0–15.0)
MCHC: 33.3 g/dL (ref 30.0–36.0)
MCV: 89.3 fl (ref 78.0–100.0)
Platelets: 273 10*3/uL (ref 150.0–400.0)
RBC: 4.45 Mil/uL (ref 3.87–5.11)
RDW: 14.4 % (ref 11.5–15.5)
WBC: 7.6 10*3/uL (ref 4.0–10.5)

## 2017-12-17 LAB — COMPREHENSIVE METABOLIC PANEL
ALK PHOS: 97 U/L (ref 39–117)
ALT: 29 U/L (ref 0–35)
AST: 24 U/L (ref 0–37)
Albumin: 4.5 g/dL (ref 3.5–5.2)
BILIRUBIN TOTAL: 0.3 mg/dL (ref 0.2–1.2)
BUN: 17 mg/dL (ref 6–23)
CO2: 29 meq/L (ref 19–32)
CREATININE: 0.79 mg/dL (ref 0.40–1.20)
Calcium: 9.7 mg/dL (ref 8.4–10.5)
Chloride: 100 mEq/L (ref 96–112)
GFR: 79.92 mL/min (ref >60.00)
Glucose, Bld: 110 mg/dL — ABNORMAL HIGH (ref 70–99)
Potassium: 4.2 mEq/L (ref 3.5–5.1)
SODIUM: 140 meq/L (ref 135–145)
TOTAL PROTEIN: 8.1 g/dL (ref 6.0–8.3)

## 2017-12-17 LAB — LDL CHOLESTEROL, DIRECT: Direct LDL: 126 mg/dL

## 2017-12-17 LAB — TSH: TSH: 3.16 u[IU]/mL (ref 0.35–4.50)

## 2017-12-18 ENCOUNTER — Other Ambulatory Visit (INDEPENDENT_AMBULATORY_CARE_PROVIDER_SITE_OTHER): Payer: 59

## 2017-12-18 DIAGNOSIS — R739 Hyperglycemia, unspecified: Secondary | ICD-10-CM | POA: Diagnosis not present

## 2017-12-18 LAB — HEMOGLOBIN A1C: Hgb A1c MFr Bld: 6.2 % (ref 4.6–6.5)

## 2017-12-21 DIAGNOSIS — R0789 Other chest pain: Secondary | ICD-10-CM | POA: Insufficient documentation

## 2017-12-21 DIAGNOSIS — R002 Palpitations: Secondary | ICD-10-CM | POA: Insufficient documentation

## 2017-12-21 NOTE — Assessment & Plan Note (Signed)
EKG showed NSR, echo ordered and she is aware to seek care if pain returns and does not remit.

## 2017-12-21 NOTE — Assessment & Plan Note (Signed)
Patient encouraged to maintain heart healthy diet, regular exercise, adequate sleep. Consider daily probiotics. Take medications as prescribed 

## 2017-12-23 ENCOUNTER — Encounter: Payer: Self-pay | Admitting: Family Medicine

## 2017-12-24 ENCOUNTER — Telehealth: Payer: Self-pay | Admitting: Family Medicine

## 2017-12-24 NOTE — Telephone Encounter (Signed)
Charted in result notes. 

## 2017-12-24 NOTE — Telephone Encounter (Signed)
Copied from [place] [id number]. Topic: Quick Communication - Lab Results >> Dec 24, 2017 11:14 AM Bartholome Bill, RMA wrote: Called patient to inform them of 12/16/17 lab results. When patient returns call, triage nurse may disclose results.

## 2017-12-30 ENCOUNTER — Ambulatory Visit: Payer: 59

## 2018-01-01 DIAGNOSIS — Z6835 Body mass index (BMI) 35.0-35.9, adult: Secondary | ICD-10-CM | POA: Diagnosis not present

## 2018-01-01 DIAGNOSIS — C50919 Malignant neoplasm of unspecified site of unspecified female breast: Secondary | ICD-10-CM | POA: Diagnosis not present

## 2018-01-01 DIAGNOSIS — Z124 Encounter for screening for malignant neoplasm of cervix: Secondary | ICD-10-CM | POA: Diagnosis not present

## 2018-01-01 DIAGNOSIS — Z01419 Encounter for gynecological examination (general) (routine) without abnormal findings: Secondary | ICD-10-CM | POA: Diagnosis not present

## 2018-01-01 DIAGNOSIS — R87612 Low grade squamous intraepithelial lesion on cytologic smear of cervix (LGSIL): Secondary | ICD-10-CM | POA: Diagnosis not present

## 2018-01-02 ENCOUNTER — Ambulatory Visit (HOSPITAL_COMMUNITY)
Admission: RE | Admit: 2018-01-02 | Discharge: 2018-01-02 | Disposition: A | Discharge status: Home / Self Care | Payer: 59 | Source: Ambulatory Visit | Attending: Family Medicine | Admitting: Family Medicine

## 2018-01-02 DIAGNOSIS — E785 Hyperlipidemia, unspecified: Secondary | ICD-10-CM | POA: Insufficient documentation

## 2018-01-02 DIAGNOSIS — D649 Anemia, unspecified: Secondary | ICD-10-CM | POA: Diagnosis not present

## 2018-01-02 DIAGNOSIS — R002 Palpitations: Secondary | ICD-10-CM | POA: Diagnosis not present

## 2018-01-02 DIAGNOSIS — R0789 Other chest pain: Secondary | ICD-10-CM | POA: Diagnosis not present

## 2018-01-02 NOTE — Progress Notes (Signed)
  Echocardiogram 2D Echocardiogram has been performed.  Darlina Sicilian M 01/02/2018, 8:24 AM

## 2018-01-06 ENCOUNTER — Encounter (INDEPENDENT_AMBULATORY_CARE_PROVIDER_SITE_OTHER): Payer: Self-pay

## 2018-01-15 ENCOUNTER — Encounter (INDEPENDENT_AMBULATORY_CARE_PROVIDER_SITE_OTHER): Payer: Self-pay

## 2018-01-20 ENCOUNTER — Other Ambulatory Visit: Payer: Self-pay | Admitting: Obstetrics and Gynecology

## 2018-01-20 DIAGNOSIS — R87619 Unspecified abnormal cytological findings in specimens from cervix uteri: Secondary | ICD-10-CM | POA: Diagnosis not present

## 2018-01-20 DIAGNOSIS — N870 Mild cervical dysplasia: Secondary | ICD-10-CM | POA: Diagnosis not present

## 2018-01-20 DIAGNOSIS — R87612 Low grade squamous intraepithelial lesion on cytologic smear of cervix (LGSIL): Secondary | ICD-10-CM | POA: Diagnosis not present

## 2018-01-26 ENCOUNTER — Ambulatory Visit (INDEPENDENT_AMBULATORY_CARE_PROVIDER_SITE_OTHER): Payer: Self-pay | Admitting: Family Medicine

## 2018-01-29 DIAGNOSIS — R87612 Low grade squamous intraepithelial lesion on cytologic smear of cervix (LGSIL): Secondary | ICD-10-CM | POA: Diagnosis not present

## 2018-01-29 DIAGNOSIS — R87619 Unspecified abnormal cytological findings in specimens from cervix uteri: Secondary | ICD-10-CM | POA: Diagnosis not present

## 2018-02-02 ENCOUNTER — Other Ambulatory Visit: Payer: Self-pay

## 2018-02-02 ENCOUNTER — Encounter (HOSPITAL_BASED_OUTPATIENT_CLINIC_OR_DEPARTMENT_OTHER): Payer: Self-pay | Admitting: *Deleted

## 2018-02-02 NOTE — Progress Notes (Addendum)
SPOKE WITH Mayuri NPO AFTER MIDNIGHT ARRIVE 530 AM [place] MEDS TO TAKE: NONE DRIVER SPOUSE [facility] ECHO 01-02-18 Epic/CHART EKG 12-16-17 EPIC CHART  NO LABS NEEDED  SURGERY ORDERS IN EPIC

## 2018-02-03 ENCOUNTER — Other Ambulatory Visit: Payer: Self-pay | Admitting: Obstetrics and Gynecology

## 2018-02-05 ENCOUNTER — Encounter (HOSPITAL_BASED_OUTPATIENT_CLINIC_OR_DEPARTMENT_OTHER): Payer: Self-pay | Admitting: Obstetrics and Gynecology

## 2018-02-05 DIAGNOSIS — R87619 Unspecified abnormal cytological findings in specimens from cervix uteri: Secondary | ICD-10-CM | POA: Diagnosis present

## 2018-02-05 NOTE — H&P (Signed)
Miranda Orozco is an 57 y.o. female. *for conization of the cervix  Pertinent Gynecological History: Menses: post-menopausal Bleeding: none Contraception: post menopausal status DES exposure: unknown Blood transfusions: none Sexually transmitted diseases: no past history Previous GYN Procedures: hysteroscopy  Last mammogram: normal Date: 2017 Last pap: abnormal: LGSIL pos HR HPV Date: 01/2018 OB History: G3, P3   Menstrual History: Menarche age: 52 No LMP recorded. Patient is postmenopausal.    Past Medical History:  Diagnosis Date  . Arthritis, rheumatic, acute or subacute 10/02/2009   RA  . BREAST CANCER, HX OF 10/02/2009  . Disorder of bone and cartilage 10/02/2009   Qualifier: Diagnosis of  By: [name] MD, [name]   . Esophageal reflux 10/02/2009  . Hives 09/18/2014  . MALIGNANT NEOPLASM OF BREAST UNSPECIFIED SITE 10/02/2009   RIGHT CHMOE AND RADIATION DONE  . Obesity   . OSTEOPENIA 10/02/2009    Past Surgical History:  Procedure Laterality Date  . BREAST LUMPECTOMY  03/20/10   for margin  . BREAST LUMPECTOMY  03/26/2010   for margin  . [place], 2001  . COLOMSCOPY  2018  . DILATATION & CURRETTAGE/HYSTEROSCOPY WITH RESECTOCOPE N/A 02/19/2013   Procedure: DILATATION & CURETTAGE/HYSTEROSCOPY WITH RESECTION OF POLYP;  Surgeon: Eldred Manges, MD;  Location: [facility];  Service: Gynecology;  Laterality: N/A;  . MASTECTOMY PARTIAL / LUMPECTOMY W/ AXILLARY LYMPHADENECTOMY  03/12/2010   Right w SLN Dr Margot Chimes  . SIMPLE MASTECTOMY  03/31/2011   For margin    Family History  Problem Relation Age of Onset  . Hypertension Mother   . Hyperlipidemia Mother   . Hypertension Father   . Diabetes Father   . Cancer Father        lung cancer, smoker  . Arthritis Other   . Diabetes Other   . Hypertension Other   . Colon cancer Neg Hx   . Stomach cancer Neg Hx     Social History:  reports that she quit smoking about 36 years ago. Her smoking use included cigarettes.  She has a 0.50 pack-year smoking history. She has never used smokeless tobacco. She reports that she drinks about 1.2 - 1.8 oz of alcohol per week. She reports that she does not use drugs.  Allergies: No Known Allergies  No medications prior to admission.    Review of Systems  Constitutional: Negative.   Respiratory: Negative.   Cardiovascular: Negative.   Gastrointestinal: Negative.   Genitourinary: Negative.   Musculoskeletal: Negative.   Psychiatric/Behavioral: Negative.     Height 5\' 5"  (1.651 m), weight 210 lb (95.3 kg). Physical Exam  Constitutional: She is oriented to person, place, and time. She appears well-developed and well-nourished.  HENT:  Head: Normocephalic and atraumatic.  Eyes: EOM are normal.  Neck: Normal range of motion. Neck supple.  Cardiovascular: Normal rate and regular rhythm.  Respiratory: Effort normal and breath sounds normal.  GI: Soft. Bowel sounds are normal.  Genitourinary: Vagina normal and uterus normal.  Musculoskeletal: Normal range of motion.  Neurological: She is alert and oriented to person, place, and time.  Skin: Skin is warm and dry.  Psychiatric: She has a normal mood and affect.    No results found for this or any previous visit (from the past 24 hour(s)).  No results found.  Assessment/Plan: Pt with LGSIL on pap with pos HR HPV and LGSIL on ECC for CKC..  Indications,   Risks and benefits reviewed.  She wishes to proceed.  Miranda Orozco 02/05/2018, 2:05 PM

## 2018-02-05 NOTE — Anesthesia Preprocedure Evaluation (Addendum)
Anesthesia Evaluation  Patient identified by MRN, date of birth, ID band Patient awake    Reviewed: Allergy & Precautions, NPO status , Patient's Chart, lab work & pertinent test results  History of Anesthesia Complications Negative for: history of anesthetic complications  Airway Mallampati: III  TM Distance: >3 FB Neck ROM: Full    Dental  (+) Caps, Dental Advisory Given,    Pulmonary neg pulmonary ROS, former smoker (quit 1983),    breath sounds clear to auscultation       Cardiovascular (-) hypertension Rhythm:Regular Rate:Normal  5/19 ECHO: EF 60-65%, valves OK   Neuro/Psych negative neurological ROS     GI/Hepatic Neg liver ROS, neg GERD  ,  Endo/Other  Morbid obesity  Renal/GU negative Renal ROS     Musculoskeletal  (+) Arthritis ,   Abdominal (+) + obese,   Peds  Hematology negative hematology ROS (+)   Anesthesia Other Findings H/o breast cancer  Reproductive/Obstetrics menopausal                           Anesthesia Physical Anesthesia Plan  ASA: II  Anesthesia Plan: General   Post-op Pain Management:    Induction: Intravenous  PONV Risk Score and Plan: 3 and Ondansetron, Dexamethasone and Scopolamine patch - Pre-op  Airway Management Planned: LMA  Additional Equipment:   Intra-op Plan:   Post-operative Plan:   Informed Consent: I have reviewed the patients History and Physical, chart, labs and discussed the procedure including the risks, benefits and alternatives for the proposed anesthesia with the patient or authorized representative who has indicated his/her understanding and acceptance.   Dental advisory given  Plan Discussed with: CRNA and Surgeon  Anesthesia Plan Comments: (Plan routine monitors, GA- LMA OK)        Anesthesia Quick Evaluation

## 2018-02-06 ENCOUNTER — Encounter (HOSPITAL_BASED_OUTPATIENT_CLINIC_OR_DEPARTMENT_OTHER)
Admission: RE | Disposition: A | Discharge status: Home / Self Care | Payer: Self-pay | Source: Ambulatory Visit | Attending: Obstetrics and Gynecology

## 2018-02-06 ENCOUNTER — Encounter (HOSPITAL_BASED_OUTPATIENT_CLINIC_OR_DEPARTMENT_OTHER): Payer: Self-pay | Admitting: *Deleted

## 2018-02-06 ENCOUNTER — Ambulatory Visit (HOSPITAL_BASED_OUTPATIENT_CLINIC_OR_DEPARTMENT_OTHER)
Admission: RE | Admit: 2018-02-06 | Discharge: 2018-02-06 | Disposition: A | Discharge status: Home / Self Care | Payer: 59 | Source: Ambulatory Visit | Attending: Obstetrics and Gynecology | Admitting: Obstetrics and Gynecology

## 2018-02-06 ENCOUNTER — Ambulatory Visit (HOSPITAL_BASED_OUTPATIENT_CLINIC_OR_DEPARTMENT_OTHER): Payer: 59 | Admitting: Anesthesiology

## 2018-02-06 ENCOUNTER — Other Ambulatory Visit: Payer: Self-pay

## 2018-02-06 DIAGNOSIS — K219 Gastro-esophageal reflux disease without esophagitis: Secondary | ICD-10-CM | POA: Diagnosis not present

## 2018-02-06 DIAGNOSIS — D649 Anemia, unspecified: Secondary | ICD-10-CM | POA: Diagnosis not present

## 2018-02-06 DIAGNOSIS — M069 Rheumatoid arthritis, unspecified: Secondary | ICD-10-CM | POA: Insufficient documentation

## 2018-02-06 DIAGNOSIS — Z853 Personal history of malignant neoplasm of breast: Secondary | ICD-10-CM | POA: Insufficient documentation

## 2018-02-06 DIAGNOSIS — M8580 Other specified disorders of bone density and structure, unspecified site: Secondary | ICD-10-CM | POA: Diagnosis not present

## 2018-02-06 DIAGNOSIS — Z6835 Body mass index (BMI) 35.0-35.9, adult: Secondary | ICD-10-CM | POA: Insufficient documentation

## 2018-02-06 DIAGNOSIS — E782 Mixed hyperlipidemia: Secondary | ICD-10-CM | POA: Diagnosis not present

## 2018-02-06 DIAGNOSIS — N72 Inflammatory disease of cervix uteri: Secondary | ICD-10-CM | POA: Diagnosis not present

## 2018-02-06 DIAGNOSIS — Z87891 Personal history of nicotine dependence: Secondary | ICD-10-CM | POA: Diagnosis not present

## 2018-02-06 DIAGNOSIS — R87619 Unspecified abnormal cytological findings in specimens from cervix uteri: Secondary | ICD-10-CM

## 2018-02-06 DIAGNOSIS — N870 Mild cervical dysplasia: Secondary | ICD-10-CM | POA: Insufficient documentation

## 2018-02-06 DIAGNOSIS — R87612 Low grade squamous intraepithelial lesion on cytologic smear of cervix (LGSIL): Secondary | ICD-10-CM | POA: Diagnosis not present

## 2018-02-06 HISTORY — PX: CERVICAL CONIZATION W/BX: SHX1330

## 2018-02-06 SURGERY — CONE BIOPSY, CERVIX
Anesthesia: General | Laterality: Bilateral

## 2018-02-06 MED ORDER — LIDOCAINE 2% (20 MG/ML) 5 ML SYRINGE
INTRAMUSCULAR | Status: DC | PRN
  Administered 2018-02-06: 30 mg via INTRAVENOUS

## 2018-02-06 MED ORDER — MIDAZOLAM HCL 2 MG/2ML IJ SOLN
INTRAMUSCULAR | Status: DC | PRN
  Administered 2018-02-06: 2 mg via INTRAVENOUS

## 2018-02-06 MED ORDER — MIDAZOLAM HCL 2 MG/2ML IJ SOLN
0.5000 mg | Freq: Once | INTRAMUSCULAR | Status: DC | PRN
  Filled 2018-02-06: qty 2

## 2018-02-06 MED ORDER — IODINE STRONG (LUGOLS) 5 % PO SOLN
ORAL | Status: DC | PRN
  Administered 2018-02-06: 0.1 mL via ORAL

## 2018-02-06 MED ORDER — SODIUM CHLORIDE 0.9 % IR SOLN
Status: DC | PRN
  Administered 2018-02-06: 1000 mL

## 2018-02-06 MED ORDER — VASOPRESSIN 20 UNIT/ML IV SOLN
INTRAVENOUS | Status: DC | PRN
  Administered 2018-02-06: 20 mL via INTRAMUSCULAR

## 2018-02-06 MED ORDER — PROPOFOL 10 MG/ML IV BOLUS
INTRAVENOUS | Status: AC
  Filled 2018-02-06: qty 40

## 2018-02-06 MED ORDER — LACTATED RINGERS IV SOLN
INTRAVENOUS | Status: DC
  Administered 2018-02-06: 07:00:00 via INTRAVENOUS
  Filled 2018-02-06: qty 1000

## 2018-02-06 MED ORDER — FENTANYL CITRATE (PF) 100 MCG/2ML IJ SOLN
INTRAMUSCULAR | Status: DC | PRN
  Administered 2018-02-06 (×2): 25 ug via INTRAVENOUS

## 2018-02-06 MED ORDER — FENTANYL CITRATE (PF) 100 MCG/2ML IJ SOLN
25.0000 ug | INTRAMUSCULAR | Status: DC | PRN
  Administered 2018-02-06: 25 ug via INTRAVENOUS
  Filled 2018-02-06: qty 1

## 2018-02-06 MED ORDER — MIDAZOLAM HCL 2 MG/2ML IJ SOLN
INTRAMUSCULAR | Status: AC
  Filled 2018-02-06: qty 2

## 2018-02-06 MED ORDER — DEXAMETHASONE SODIUM PHOSPHATE 10 MG/ML IJ SOLN
INTRAMUSCULAR | Status: DC | PRN
  Administered 2018-02-06: 10 mg via INTRAVENOUS

## 2018-02-06 MED ORDER — LIDOCAINE 2% (20 MG/ML) 5 ML SYRINGE
INTRAMUSCULAR | Status: AC
  Filled 2018-02-06: qty 5

## 2018-02-06 MED ORDER — LIDOCAINE HCL 2 % IJ SOLN
INTRAMUSCULAR | Status: DC | PRN
  Administered 2018-02-06: 10 mL

## 2018-02-06 MED ORDER — ONDANSETRON HCL 4 MG/2ML IJ SOLN
INTRAMUSCULAR | Status: DC | PRN
  Administered 2018-02-06: 4 mg via INTRAVENOUS

## 2018-02-06 MED ORDER — SCOPOLAMINE 1 MG/3DAYS TD PT72
1.0000 | MEDICATED_PATCH | Freq: Once | TRANSDERMAL | Status: DC
  Administered 2018-02-06: 1.5 mg via TRANSDERMAL
  Filled 2018-02-06: qty 1

## 2018-02-06 MED ORDER — MEPERIDINE HCL 25 MG/ML IJ SOLN
6.2500 mg | INTRAMUSCULAR | Status: DC | PRN
  Filled 2018-02-06: qty 1

## 2018-02-06 MED ORDER — SCOPOLAMINE 1 MG/3DAYS TD PT72
MEDICATED_PATCH | TRANSDERMAL | Status: AC
  Filled 2018-02-06: qty 1

## 2018-02-06 MED ORDER — FENTANYL CITRATE (PF) 100 MCG/2ML IJ SOLN
INTRAMUSCULAR | Status: AC
  Filled 2018-02-06: qty 2

## 2018-02-06 MED ORDER — PROPOFOL 10 MG/ML IV BOLUS
INTRAVENOUS | Status: DC | PRN
  Administered 2018-02-06: 200 mg via INTRAVENOUS

## 2018-02-06 MED ORDER — KETOROLAC TROMETHAMINE 30 MG/ML IJ SOLN
INTRAMUSCULAR | Status: DC | PRN
  Administered 2018-02-06: 30 mg via INTRAVENOUS

## 2018-02-06 MED ORDER — PROMETHAZINE HCL 25 MG/ML IJ SOLN
6.2500 mg | INTRAMUSCULAR | Status: DC | PRN
  Filled 2018-02-06: qty 1

## 2018-02-06 MED ORDER — ARTIFICIAL TEARS OPHTHALMIC OINT
TOPICAL_OINTMENT | OPHTHALMIC | Status: AC
  Filled 2018-02-06: qty 3.5

## 2018-02-06 SURGICAL SUPPLY — 29 items
APPLICATOR COTTON TIP 6IN STRL (MISCELLANEOUS) ×2 IMPLANT
BLADE SURG 11 STRL SS (BLADE) ×2 IMPLANT
CATH ROBINSON RED A/P 16FR (CATHETERS) ×2 IMPLANT
ELECT BALL LEEP 3MM BLK (ELECTRODE) IMPLANT
ELECT BALL LEEP 5MM RED (ELECTRODE) IMPLANT
ELECT LOOP LEEP RND 15X12 GRN (CUTTING LOOP)
ELECT LOOP LEEP RND 20X12 WHT (CUTTING LOOP)
ELECT REM PT RETURN 9FT ADLT (ELECTROSURGICAL) ×2
ELECTRODE LOOP LP RND 15X12GRN (CUTTING LOOP) IMPLANT
ELECTRODE LOOP LP RND 20X12WHT (CUTTING LOOP) IMPLANT
ELECTRODE REM PT RTRN 9FT ADLT (ELECTROSURGICAL) IMPLANT
GLOVE SURG SS PI 6.5 STRL IVOR (GLOVE) ×4 IMPLANT
GOWN STRL REUS W/TWL LRG LVL3 (GOWN DISPOSABLE) ×4 IMPLANT
KIT TURNOVER CYSTO (KITS) ×2 IMPLANT
NDL SPNL 22GX7 QUINCKE BK (NEEDLE) IMPLANT
NEEDLE SPNL 22GX7 QUINCKE BK (NEEDLE) ×2 IMPLANT
PACK VAGINAL WOMENS (CUSTOM PROCEDURE TRAY) ×2 IMPLANT
PAD OB MATERNITY 4.3X12.25 (PERSONAL CARE ITEMS) ×2 IMPLANT
SCOPETTES 8  STERILE (MISCELLANEOUS)
SCOPETTES 8 STERILE (MISCELLANEOUS) ×1 IMPLANT
SPONGE SURGIFOAM ABS GEL 12-7 (HEMOSTASIS) ×1 IMPLANT
SUT VIC AB 0 CT1 18XCR BRD8 (SUTURE) ×1 IMPLANT
SUT VIC AB 0 CT1 8-18 (SUTURE) ×2
SUT VIC AB 0 CT2 27 (SUTURE) IMPLANT
SYR CONTROL 10ML LL (SYRINGE) ×2 IMPLANT
SYR TB 1ML 27GX1/2 SAFE (SYRINGE) IMPLANT
SYR TB 1ML 27GX1/2 SAFETY (SYRINGE)
TOWEL OR 17X24 6PK STRL BLUE (TOWEL DISPOSABLE) ×4 IMPLANT
YANKAUER SUCT BULB TIP NO VENT (SUCTIONS) ×1 IMPLANT

## 2018-02-06 NOTE — Op Note (Signed)
Procedure(s): CONIZATION CERVIX WITH BIOPSY Procedure Note  Miranda Orozco female 57 y.o. 02/06/2018  Procedure(s) and Anesthesia Type:    * CONIZATION CERVIX WITH BIOPSY - Choice  Surgeon(s) and Role:    * Kathelene Rumberger, Seymour Bars, MD - Primary   Indications: The patient had a recent abnormal Pap smear showing low-grade squamous intraepithelial lesion and at colposcopy and low-grade squamous intraepithelial lesion on the exocervix and in the endocervical curettings..        Surgeon: Eldred Manges   Assistants: None  Anesthesia: General LMA anesthesia  ASA Class: 3    Procedure Detail  CONIZATION CERVIX WITH BIOPSY  Findings: There was a large Lugol's nonstaining area on the cervix which was taken as part of the conization specimen as well as the endocervix.  Estimated Blood Loss:  less than 50 mL         Drains: None           Blood Given: none          Specimens: Cervical conization with stitch at 12:00, endocervical curettings, endometrial curettings                Complications: None         Disposition: PACU - hemodynamically stable.         Condition: stable  Procedure: The patient was taken to the operating room after appropriate identification and placed on the operating table.  After the attainment of adequate general anesthesia she was placed in the lithotomy position.  The perineum and vagina were prepped with multiple layers of Betadine.  The perineum was draped as a sterile field.  A timeout was held.  A weighted speculum was placed in the posterior vagina and 10 cc of 2% Xylocaine used to achieve a paracervical block at the 5 and 7:00 positions.  A single-tooth tenaculum was used to grasp the anterior cervix and a dilute solution of Pitressin infiltrated throughout the cervix.  Lugol staining was applied.  The endocervix was sounded to approximately 2 cm.  Hemostatic sutures were placed at the 3 and 9:00 positions and held.  A cone shaped specimen was then  excised from the cervix including the endocervix and the entire Lugol's nonstaining area.  Any remaining endocervical area was curetted.  The endometrium was curetted.  Both produced minimal tissue but were sent as separate specimens.    Sturmdorf sutures were placed at the 12 and 6:00 positions.  Hemostatic sutures were placed at the 3 and 9:00 positions and hemostasis was noted to be adequate.  Gelfoam soaked in Pitressin was placed in the conization bed and all instruments removed from the vagina.  The patient was awakened from general anesthesia and taken to the recovery room in satisfactory condition having tolerated procedure well sponge and instrument counts correct.  SCD hose were used throughout the procedure.

## 2018-02-06 NOTE — Transfer of Care (Signed)
Immediate Anesthesia Transfer of Care Note  Patient: Miranda Orozco  Procedure(s) Performed: CONIZATION CERVIX WITH BIOPSY (Bilateral )  Patient Location: PACU  Anesthesia Type:General  Level of Consciousness: awake, alert , oriented and patient cooperative  Airway & Oxygen Therapy: Patient Spontanous Breathing and Patient connected to nasal cannula oxygen  Post-op Assessment: Report given to RN and Post -op Vital signs reviewed and stable  Post vital signs: Reviewed and stable  Last Vitals:  Vitals Value Taken Time  BP    Temp    Pulse    Resp    SpO2      Last Pain:  Vitals:   02/06/18 0617  TempSrc:   PainSc: 0-No pain      Patients Stated Pain Goal: 5 (44/81/85 [id number])  Complications: No apparent anesthesia complications

## 2018-02-06 NOTE — Discharge Instructions (Signed)
Post Anesthesia Home Care Instructions  Activity: Get plenty of rest for the remainder of the day. A responsible individual must stay with you for 24 hours following the procedure.  For the next 24 hours, DO NOT: -Drive a car -Paediatric nurse -Drink alcoholic beverages -Take any medication unless instructed by your physician -Make any legal decisions or sign important papers.  Meals: Start with liquid foods such as gelatin or soup. Progress to regular foods as tolerated. Avoid greasy, spicy, heavy foods. If nausea and/or vomiting occur, drink only clear liquids until the nausea and/or vomiting subsides. Call your physician if vomiting continues.  Special Instructions/Symptoms: Your throat may feel dry or sore from the anesthesia or the breathing tube placed in your throat during surgery. If this causes discomfort, gargle with warm salt water. The discomfort should disappear within 24 hours.  If you had a scopolamine patch placed behind your ear for the management of post- operative nausea and/or vomiting:  1. The medication in the patch is effective for 72 hours, after which it should be removed.  Wrap patch in a tissue and discard in the trash. Wash hands thoroughly with soap and water. 2. You may remove the patch earlier than 72 hours if you experience unpleasant side effects which may include dry mouth, dizziness or visual disturbances. 3. Avoid touching the patch. Wash your hands with soap and water after contact with the patch.      Cervical Conization, Care After This sheet gives you information about how to care for yourself after your procedure. Your health care provider may also give you more specific instructions. If you have problems or questions, contact your health care provider. What can I expect after the procedure? After the procedure, it is common to have:  A groggy feeling, if you were given medicine to make you fall asleep (general anesthetic).  Cramps that  feel similar to menstrual cramps.  Bloody discharge or light to moderate bleeding.  Dark discharge. This discharge may look similar to coffee grounds. This is from the paste that was applied to the cervix to control bleeding.  Follow these instructions at home: Medicines  Take over-the-counter and prescription medicines only as told by your health care provider.  Do not take aspirin until your health care provider says it is okay. Aspirin can cause bleeding.  If you are taking pain medicine: ? You may need to prevent or treat constipation. To do this, your health care provider may recommend that you:  Drink enough fluid to keep your urine clear or pale yellow.  Take over-the-counter or prescription medicines.  Eat foods that are high in fiber, such as fresh fruits and vegetables, whole grains, bran, and beans.  Limit foods that are high in fat and processed sugars, such as fried and sweet foods. ? Do not drive or use heavy machinery. General instructions  You may resume your normal diet unless your health care provider advises you not to do so.  Take showers for the first week. Do not take baths, swim, or use hot tubs until your health care provider says it is okay.  Do not douche, use tampons, or have sex until your health care provider says it is okay.  Avoid activities that require great effort, such as exercises and heavy lifting, for at least 7-14 days.  Keep all follow-up visits as told by your health care provider. This is important. Contact a health care provider if:  You develop a rash.  You are dizzy or  lightheaded.  You feel nauseous or you vomit.  You develop a bad smelling discharge from your vagina. Get help right away if:  You have blood clots or bleeding that is heavier than a normal period. Bleeding that soaks a pad in less than 1 hour is considered heavy bleeding.  You have a fever.  You have increasing cramps.  You faint.  You have pain when  you urinate.  You have severe or worsening pain.  Your pain is not relieved when you take medicine.  You have bloody urine.  You vomit. Summary  After the procedure, it is common to have cramps and dark or bloody discharge from your vagina.  Do not douche, use tampons, or have sex until your health care provider says it is okay.  Follow all other activity restrictions as told by your health care provider. This information is not intended to replace advice given to you by your health care provider. Make sure you discuss any questions you have with your health care provider. Document Released: 07/22/2005 Document Revised: 07/24/2016 Document Reviewed: 07/24/2016 Elsevier Interactive Patient Education  2017 [employer].

## 2018-02-06 NOTE — Anesthesia Procedure Notes (Signed)
Procedure Name: LMA Insertion Date/Time: 02/06/2018 7:30 AM Performed by: Wanita Chamberlain, CRNA Pre-anesthesia Checklist: Patient identified, Emergency Drugs available, Suction available, Patient being monitored and Timeout performed Patient Re-evaluated:Patient Re-evaluated prior to induction Oxygen Delivery Method: Circle system utilized Preoxygenation: Pre-oxygenation with 100% oxygen Induction Type: IV induction Ventilation: Mask ventilation without difficulty LMA: LMA inserted LMA Size: 4.0 Number of attempts: 1 Airway Equipment and Method: Bite block (Gauze bite block protecting veneer) Placement Confirmation: breath sounds checked- equal and bilateral,  CO2 detector and positive ETCO2 Tube secured with: Tape

## 2018-02-06 NOTE — Anesthesia Postprocedure Evaluation (Signed)
Anesthesia Post Note  Patient: Miranda Orozco  Procedure(s) Performed: CONIZATION CERVIX WITH BIOPSY (Bilateral )     Patient location during evaluation: PACU Anesthesia Type: General Level of consciousness: awake and alert, oriented and patient cooperative Pain management: pain level controlled Vital Signs Assessment: post-procedure vital signs reviewed and stable Respiratory status: spontaneous breathing, nonlabored ventilation and respiratory function stable Cardiovascular status: blood pressure returned to baseline and stable Postop Assessment: no apparent nausea or vomiting and able to ambulate Anesthetic complications: no    Last Vitals:  Vitals:   02/06/18 0900 02/06/18 0915  BP: 108/70 114/79  Pulse: 67 73  Resp: (!) 9 18  Temp:    SpO2: 93% 92%    Last Pain:  Vitals:   02/06/18 0915  TempSrc:   PainSc: 3                  Jarryd Gratz,E. Jerusalen Mateja

## 2018-02-09 ENCOUNTER — Encounter (HOSPITAL_BASED_OUTPATIENT_CLINIC_OR_DEPARTMENT_OTHER): Payer: Self-pay | Admitting: Obstetrics and Gynecology

## 2018-04-16 DIAGNOSIS — C50911 Malignant neoplasm of unspecified site of right female breast: Secondary | ICD-10-CM | POA: Diagnosis not present

## 2018-05-25 DIAGNOSIS — H43811 Vitreous degeneration, right eye: Secondary | ICD-10-CM | POA: Diagnosis not present

## 2018-06-01 DIAGNOSIS — H43811 Vitreous degeneration, right eye: Secondary | ICD-10-CM | POA: Diagnosis not present

## 2018-06-04 DIAGNOSIS — H43811 Vitreous degeneration, right eye: Secondary | ICD-10-CM | POA: Diagnosis not present

## 2018-06-18 ENCOUNTER — Ambulatory Visit: Payer: 59 | Admitting: Family Medicine

## 2018-06-23 DIAGNOSIS — H43811 Vitreous degeneration, right eye: Secondary | ICD-10-CM | POA: Diagnosis not present

## 2018-06-29 ENCOUNTER — Inpatient Hospital Stay: Payer: 59 | Attending: Oncology

## 2018-06-29 DIAGNOSIS — Z170 Estrogen receptor positive status [ER+]: Secondary | ICD-10-CM

## 2018-06-29 DIAGNOSIS — C50411 Malignant neoplasm of upper-outer quadrant of right female breast: Secondary | ICD-10-CM

## 2018-06-29 DIAGNOSIS — Z853 Personal history of malignant neoplasm of breast: Secondary | ICD-10-CM | POA: Diagnosis not present

## 2018-06-29 DIAGNOSIS — M05731 Rheumatoid arthritis with rheumatoid factor of right wrist without organ or systems involvement: Secondary | ICD-10-CM

## 2018-06-29 LAB — COMPREHENSIVE METABOLIC PANEL
ALK PHOS: 90 U/L (ref 38–126)
ALT: 33 U/L (ref 0–44)
AST: 23 U/L (ref 15–41)
Albumin: 3.9 g/dL (ref 3.5–5.0)
Anion gap: 9 (ref 5–15)
BILIRUBIN TOTAL: 0.3 mg/dL (ref 0.3–1.2)
BUN: 13 mg/dL (ref 6–20)
CO2: 26 mmol/L (ref 22–32)
CREATININE: 0.85 mg/dL (ref 0.44–1.00)
Calcium: 9 mg/dL (ref 8.9–10.3)
Chloride: 105 mmol/L (ref 98–111)
GFR calc Af Amer: >60 mL/min (ref >60)
GFR calc non Af Amer: >60 mL/min (ref >60)
Glucose, Bld: 202 mg/dL — ABNORMAL HIGH (ref 70–99)
Potassium: 3.9 mmol/L (ref 3.5–5.1)
Sodium: 140 mmol/L (ref 135–145)
TOTAL PROTEIN: 7.9 g/dL (ref 6.5–8.1)

## 2018-06-29 LAB — CBC WITH DIFFERENTIAL/PLATELET
Abs Immature Granulocytes: 0.03 10*3/uL (ref 0.00–0.07)
BASOS PCT: 0 %
Basophils Absolute: 0 10*3/uL (ref 0.0–0.1)
EOS PCT: 3 %
Eosinophils Absolute: 0.2 10*3/uL (ref 0.0–0.5)
HCT: 40.4 % (ref 36.0–46.0)
HEMOGLOBIN: 13.2 g/dL (ref 12.0–15.0)
Immature Granulocytes: 0 %
Lymphocytes Relative: 30 %
Lymphs Abs: 2 10*3/uL (ref 0.7–4.0)
MCH: 29.1 pg (ref 26.0–34.0)
MCHC: 32.7 g/dL (ref 30.0–36.0)
MCV: 89.2 fL (ref 80.0–100.0)
MONO ABS: 0.5 10*3/uL (ref 0.1–1.0)
Monocytes Relative: 7 %
Neutro Abs: 4 10*3/uL (ref 1.7–7.7)
Neutrophils Relative %: 60 %
PLATELETS: 257 10*3/uL (ref 150–400)
RBC: 4.53 MIL/uL (ref 3.87–5.11)
RDW: 13.1 % (ref 11.5–15.5)
WBC: 6.7 10*3/uL (ref 4.0–10.5)
nRBC: 0 % (ref 0.0–0.2)

## 2018-06-30 ENCOUNTER — Ambulatory Visit (INDEPENDENT_AMBULATORY_CARE_PROVIDER_SITE_OTHER): Payer: 59 | Admitting: Family Medicine

## 2018-06-30 ENCOUNTER — Encounter: Payer: Self-pay | Admitting: Family Medicine

## 2018-06-30 DIAGNOSIS — E669 Obesity, unspecified: Secondary | ICD-10-CM | POA: Diagnosis not present

## 2018-06-30 DIAGNOSIS — E782 Mixed hyperlipidemia: Secondary | ICD-10-CM | POA: Diagnosis not present

## 2018-06-30 DIAGNOSIS — J40 Bronchitis, not specified as acute or chronic: Secondary | ICD-10-CM

## 2018-06-30 DIAGNOSIS — F418 Other specified anxiety disorders: Secondary | ICD-10-CM

## 2018-06-30 MED ORDER — ALBUTEROL SULFATE HFA 108 (90 BASE) MCG/ACT IN AERS
2.0000 | INHALATION_SPRAY | Freq: Four times a day (QID) | RESPIRATORY_TRACT | 0 refills | Status: DC | PRN
Start: 2018-06-30 — End: 2020-05-01

## 2018-06-30 MED ORDER — DOXYCYCLINE HYCLATE 100 MG PO TABS: 100.0000 mg | ORAL_TABLET | Freq: Two times a day (BID) | ORAL | 0 refills | Status: DC

## 2018-06-30 MED ORDER — ALPRAZOLAM 0.25 MG PO TABS: 0.2500 mg | ORAL_TABLET | Freq: Two times a day (BID) | ORAL | 1 refills | Status: DC | PRN

## 2018-06-30 MED ORDER — HYDROCODONE-HOMATROPINE 5-1.5 MG/5ML PO SYRP: 5.0000 mL | ORAL_SOLUTION | Freq: Three times a day (TID) | ORAL | 0 refills | Status: DC | PRN

## 2018-06-30 MED FILL — ALPRAZolam 0.25 MG TABS: 0.25 | 10 days supply | Qty: 20 | Fill #0

## 2018-06-30 MED FILL — VENTOLIN HFA 90 MCG INHALER: 108 (90 BAS | 25 days supply | Qty: 18 | Fill #0

## 2018-06-30 MED FILL — DOXYCYCLINE HYCLATE 100 MG: 100 | 10 days supply | Qty: 20 | Fill #0

## 2018-06-30 MED FILL — HYDROCODONE-HOMATROPINE SYR: 5-1.5 | 8 days supply | Qty: 120 | Fill #0

## 2018-06-30 NOTE — Patient Instructions (Addendum)
Encouraged increased rest and hydration, add probiotics, zinc such as Coldeze or Xicam. Treat fevers as needed. Mucinex twice daily, elderberry, vitamin C 500 go 1000 mg   [place] makes good supplements can get at [email or web address]  Folic Acid 381 to 0175 mcg daily  Shingrix is the new the shingles shot can call for nurse appointment when ready to take the shot   Acute Bronchitis, Adult Acute bronchitis is when air tubes (bronchi) in the lungs suddenly get swollen. The condition can make it hard to breathe. It can also cause these symptoms:  A cough.  Coughing up clear, yellow, or green mucus.  Wheezing.  Chest congestion.  Shortness of breath.  A fever.  Body aches.  Chills.  A sore throat.  Follow these instructions at home: Medicines  Take over-the-counter and prescription medicines only as told by your doctor.  If you were prescribed an antibiotic medicine, take it as told by your doctor. Do not stop taking the antibiotic even if you start to feel better. General instructions  Rest.  Drink enough fluids to keep your pee (urine) clear or pale yellow.  Avoid smoking and secondhand smoke. If you smoke and you need help quitting, ask your doctor. Quitting will help your lungs heal faster.  Use an inhaler, cool mist vaporizer, or humidifier as told by your doctor.  Keep all follow-up visits as told by your doctor. This is important. How is this prevented? To lower your risk of getting this condition again:  Wash your hands often with soap and water. If you cannot use soap and water, use hand sanitizer.  Avoid contact with people who have cold symptoms.  Try not to touch your hands to your mouth, nose, or eyes.  Make sure to get the flu shot every year.  Contact a doctor if:  Your symptoms do not get better in 2 weeks. Get help right away if:  You cough up blood.  You have chest pain.  You have very bad shortness of breath.  You become  dehydrated.  You faint (pass out) or keep feeling like you are going to pass out.  You keep throwing up (vomiting).  You have a very bad headache.  Your fever or chills gets worse. This information is not intended to replace advice given to you by your health care provider. Make sure you discuss any questions you have with your health care provider. Document Released: 01/08/2008 Document Revised: 02/28/2016 Document Reviewed: 01/10/2016 Elsevier Interactive Patient Education  [employer].

## 2018-07-06 DIAGNOSIS — J40 Bronchitis, not specified as acute or chronic: Secondary | ICD-10-CM | POA: Insufficient documentation

## 2018-07-06 DIAGNOSIS — F419 Anxiety disorder, unspecified: Secondary | ICD-10-CM | POA: Insufficient documentation

## 2018-07-06 NOTE — Progress Notes (Signed)
Subjective:    Patient ID: Miranda Orozco, female    DOB: October 08, 1960, 57 y.o.   MRN: [id number]  No chief complaint on file.   HPI Patient is in today for follow up and is under a great deal of stress.  She is struggling with the recent diagnosis of prostate cancer with her husband and there deciding on a course of treatment.  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  She has been feeling ill with an upper respiratory set of symptoms for about a week.  She has had and chest congestion.  Has a cough and notes it is productive at times.  The cough is significant and worse when she lies down thus keeping her up at night.  She notes some wheezing at times as well.  She denies any fevers or chills.  But does endorse some malaise and fatigue. Denies CP/palp/HA/fevers/GI or GU c/o. Taking meds as prescribed  Past Medical History:  Diagnosis Date  . Arthritis, rheumatic, acute or subacute 10/02/2009   RA  . BREAST CANCER, HX OF 10/02/2009  . Disorder of bone and cartilage 10/02/2009   Qualifier: Diagnosis of  By: [name] MD, [name]   . Esophageal reflux 10/02/2009  . Hives 09/18/2014  . MALIGNANT NEOPLASM OF BREAST UNSPECIFIED SITE 10/02/2009   RIGHT CHMOE AND RADIATION DONE  . Obesity   . OSTEOPENIA 10/02/2009    Past Surgical History:  Procedure Laterality Date  . BREAST LUMPECTOMY  03/20/10   for margin  . BREAST LUMPECTOMY  03/26/2010   for margin  . CERVICAL CONIZATION W/BX Bilateral 02/06/2018   Procedure: CONIZATION CERVIX WITH BIOPSY;  Surgeon: Eldred Manges, MD;  [facility] [facility];  Service: Gynecology;  Laterality: Bilateral;  . [place], 2001  . COLOMSCOPY  2018  . DILATATION & CURRETTAGE/HYSTEROSCOPY WITH RESECTOCOPE N/A 02/19/2013   Procedure: DILATATION & CURETTAGE/HYSTEROSCOPY WITH RESECTION OF POLYP;  Surgeon: Eldred Manges, MD;  Location: [facility];  Service: Gynecology;  Laterality: N/A;  . MASTECTOMY  PARTIAL / LUMPECTOMY W/ AXILLARY LYMPHADENECTOMY  03/12/2010   Right w SLN Dr Margot Chimes  . SIMPLE MASTECTOMY  03/31/2011   For margin    Family History  Problem Relation Age of Onset  . Hypertension Mother   . Hyperlipidemia Mother   . Hypertension Father   . Diabetes Father   . Cancer Father        lung cancer, smoker  . Arthritis Other   . Diabetes Other   . Hypertension Other   . Colon cancer Neg Hx   . Stomach cancer Neg Hx     Social History   Socioeconomic History  . Marital status: Married    Spouse name: Not on file  . Number of children: Not on file  . Years of education: Not on file  . Highest education level: Not on file  Occupational History  . Not on file  Social Needs  . Financial resource strain: Not on file  . Food insecurity:    Worry: Not on file    Inability: Not on file  . Transportation needs:    Medical: Not on file    Non-medical: Not on file  Tobacco Use  . Smoking status: Former Smoker    Packs/day: 0.50    Years: 1.00    Pack years: 0.50    Types: Cigarettes    Last attempt to quit: 10/17/1981  Years since quitting: 36.7  . Smokeless tobacco: Never Used  Substance and Sexual Activity  . Alcohol use: Yes    Alcohol/week: 2.0 - 3.0 standard drinks    Types: 2 - 3 Glasses of wine per week  . Drug use: No  . Sexual activity: Yes    Birth control/protection: None    Comment: lives with husband and children, no dietary restriction  Lifestyle  . Physical activity:    Days per week: Not on file    Minutes per session: Not on file  . Stress: Not on file  Relationships  . Social connections:    Talks on phone: Not on file    Gets together: Not on file    Attends religious service: Not on file    Active member of club or organization: Not on file    Attends meetings of clubs or organizations: Not on file    Relationship status: Not on file  . Intimate partner violence:    Fear of current or ex partner: Not on file    Emotionally abused:  Not on file    Physically abused: Not on file    Forced sexual activity: Not on file  Other Topics Concern  . Not on file  Social History Narrative  . Not on file    Outpatient Medications Prior to Visit  Medication Sig Dispense Refill  . Multiple Vitamins-Minerals (MULTIVITAMIN WITH MINERALS) tablet Take 1 tablet by mouth daily.    Marland Kitchen ibuprofen (ADVIL,MOTRIN) 600 MG tablet Ibuprofen 600 mg orally every 6 hours for 3  days, then every 6 hours as needed. For pain 60 tablet 2   No facility-administered medications prior to visit.     No Known Allergies  Review of Systems  Constitutional: Positive for malaise/fatigue. Negative for fever.  HENT: Positive for congestion.   Eyes: Negative for blurred vision.  Respiratory: Positive for cough, sputum production, shortness of breath and wheezing.   Cardiovascular: Negative for chest pain, palpitations and leg swelling.  Gastrointestinal: Negative for abdominal pain, blood in stool and nausea.  Genitourinary: Negative for dysuria and frequency.  Musculoskeletal: Positive for myalgias. Negative for falls.  Skin: Negative for rash.  Neurological: Negative for dizziness, loss of consciousness and headaches.  Endo/Heme/Allergies: Negative for environmental allergies.  Psychiatric/Behavioral: Negative for depression. The patient is nervous/anxious.        Objective:    Physical Exam  Constitutional: She is oriented to person, place, and time. She appears well-developed and well-nourished. No distress.  HENT:  Head: Normocephalic and atraumatic.  Nose: Nose normal.  Eyes: Right eye exhibits no discharge. Left eye exhibits no discharge.  Neck: Normal range of motion. Neck supple.  Cardiovascular: Normal rate and regular rhythm.  No murmur heard. Pulmonary/Chest: Effort normal and breath sounds normal.  Abdominal: Soft. Bowel sounds are normal. There is no tenderness.  Musculoskeletal: She exhibits no edema.  Neurological: She is alert  and oriented to person, place, and time.  Skin: Skin is warm and dry.  Psychiatric: She has a normal mood and affect.  Nursing note and vitals reviewed.   BP 122/86 (BP Location: Left Arm, Patient Position: Sitting, Cuff Size: Normal)   Pulse 80   Temp 99.1 F (37.3 C) (Oral)   Resp 18   Ht _0  (1.676 m)   Wt 207 lb 3.2 oz (94 kg)   SpO2 97%   BMI 33.44 kg/m  Wt Readings from Last 3 Encounters:  06/30/18 207 lb 3.2 oz (94  kg)  02/06/18 211 lb 8 oz (95.9 kg)  12/16/17 214 lb (97.1 kg)     Lab Results  Component Value Date   WBC 6.7 06/29/2018   HGB 13.2 06/29/2018   HCT 40.4 06/29/2018   PLT 257 06/29/2018   GLUCOSE 202 (H) 06/29/2018   CHOL 240 (H) 12/16/2017   TRIG 360.0 (H) 12/16/2017   HDL 43.60 12/16/2017   LDLDIRECT 126.0 12/16/2017   ALT 33 06/29/2018   AST 23 06/29/2018   NA 140 06/29/2018   K 3.9 06/29/2018   CL 105 06/29/2018   CREATININE 0.85 06/29/2018   BUN 13 06/29/2018   CO2 26 06/29/2018   TSH 3.16 12/16/2017   HGBA1C 6.2 12/18/2017    Lab Results  Component Value Date   TSH 3.16 12/16/2017   Lab Results  Component Value Date   WBC 6.7 06/29/2018   HGB 13.2 06/29/2018   HCT 40.4 06/29/2018   MCV 89.2 06/29/2018   PLT 257 06/29/2018   Lab Results  Component Value Date   NA 140 06/29/2018   K 3.9 06/29/2018   CHLORIDE 103 07/02/2017   CO2 26 06/29/2018   GLUCOSE 202 (H) 06/29/2018   BUN 13 06/29/2018   CREATININE 0.85 06/29/2018   BILITOT 0.3 06/29/2018   ALKPHOS 90 06/29/2018   AST 23 06/29/2018   ALT 33 06/29/2018   PROT 7.9 06/29/2018   ALBUMIN 3.9 06/29/2018   CALCIUM 9.0 06/29/2018   ANIONGAP 9 06/29/2018   EGFR >60 07/02/2017   GFR 79.92 12/16/2017   Lab Results  Component Value Date   CHOL 240 (H) 12/16/2017   Lab Results  Component Value Date   HDL 43.60 12/16/2017   No results found for: [facility] Lab Results  Component Value Date   TRIG 360.0 (H) 12/16/2017   Lab Results  Component Value Date   CHOLHDL  6 12/16/2017   Lab Results  Component Value Date   HGBA1C 6.2 12/18/2017       Assessment & Plan:   Problem List Items Addressed This Visit    Obesity (BMI 30.0-34.9)    Encouraged DASH diet, decrease po intake and increase exercise as tolerated. Needs 7-8 hours of sleep nightly. Avoid trans fats, eat small, frequent meals every 4-5 hours with lean proteins, complex carbs and healthy fats. Minimize simple carbs, discussed healthy weight and wellness program      Hyperlipidemia, mixed    Encouraged heart healthy diet, increase exercise, avoid trans fats, consider a krill oil cap daily      Situational anxiety    Patient husband diagnosed with prostate cancer and undergoing treatment and a adult son at home who needs 24/7 care for a disability since birth. Requires complete care. They have begun to consider a home for him if they can find the right place. Discussed options and is given a small amount of alprazolam to use prn for now. Report worsening symptoms      Relevant Medications   ALPRAZolam (XANAX) 0.25 MG tablet   Bronchitis    Encouraged increased rest and hydration, add probiotics, zinc such as Coldeze or Xicam. Treat fevers as needed. Elderberry and vitamin C. Given Albuterol, cough syrup and if no improvement will take an antibiotic         I have discontinued Rockie Smarr's ibuprofen. I am also having her start on doxycycline, HYDROcodone-homatropine, albuterol, and ALPRAZolam. Additionally, I am having her maintain her multivitamin with minerals.  Meds ordered this encounter  Medications  .  doxycycline (VIBRA-TABS) 100 MG tablet    Sig: Take 1 tablet (100 mg total) by mouth 2 (two) times daily.    Dispense:  20 tablet    Refill:  0  . HYDROcodone-homatropine (HYCODAN) 5-1.5 MG/5ML syrup    Sig: Take 5 mLs by mouth every 8 (eight) hours as needed for cough.    Dispense:  120 mL    Refill:  0  . albuterol (PROVENTIL HFA;VENTOLIN HFA) 108 (90 Base) MCG/ACT inhaler     Sig: Inhale 2 puffs into the lungs every 6 (six) hours as needed for wheezing or shortness of breath.    Dispense:  1 Inhaler    Refill:  0  . ALPRAZolam (XANAX) 0.25 MG tablet    Sig: Take 1 tablet (0.25 mg total) by mouth 2 (two) times daily as needed for anxiety or sleep.    Dispense:  20 tablet    Refill:  1     Penni Homans, MD

## 2018-07-06 NOTE — Assessment & Plan Note (Signed)
Encouraged DASH diet, decrease po intake and increase exercise as tolerated. Needs 7-8 hours of sleep nightly. Avoid trans fats, eat small, frequent meals every 4-5 hours with lean proteins, complex carbs and healthy fats. Minimize simple carbs, discussed healthy weight and wellness program

## 2018-07-06 NOTE — Assessment & Plan Note (Signed)
Patient husband diagnosed with prostate cancer and undergoing treatment and a adult son at home who needs 24/7 care for a disability since birth. Requires complete care. They have begun to consider a home for him if they can find the right place. Discussed options and is given a small amount of alprazolam to use prn for now. Report worsening symptoms

## 2018-07-06 NOTE — Assessment & Plan Note (Signed)
Encouraged increased rest and hydration, add probiotics, zinc such as Coldeze or Xicam. Treat fevers as needed. Elderberry and vitamin C. Given Albuterol, cough syrup and if no improvement will take an antibiotic

## 2018-07-06 NOTE — Assessment & Plan Note (Signed)
Encouraged heart healthy diet, increase exercise, avoid trans fats, consider a krill oil cap daily 

## 2018-07-09 ENCOUNTER — Ambulatory Visit: Payer: 59 | Admitting: Oncology

## 2018-07-11 NOTE — Progress Notes (Signed)
ID: Miranda Orozco   DOB: 10-30-1960  MR#: [id number]  OMA#:[id number]  PCP: Mosie Lukes, MD GYN: Kendall Flack MD SU: Stark Klein OTHER MD: Bo Merino, Edmonia James  CHIEF COMPLAINT: Estrogen receptor positive breast cancer  CURRENT TREATMENT: Observation   HISTORY OF PRESENT ILLNESS: From the original intake note:  Around November 2010 Miranda Orozco herself palpated what felt like a mass in the upper portion of her right breast. She thought this likely was connected with menstruation but when it persisted into January, she brought it to Dr. Caralee Ates attention and was set up for mammography January 14th,.  Dr. Derrel Nip confirmed a vague palpable focus over the upper outer right breast.  The mammogram was heterogeneously dense with mildly increased density in the upper outer right breast.  There were a few scattered microcalcifications that were not significantly changed.  There was no definite focal abnormality to correspond to the palpable abnormality.  Ultrasound showed a vague hypoechoic area at the 10 o'clock position 5 cm from the nipple, corresponding to the palpable abnormality.    Biopsy of this area was performed January 18th and showed ([id number]) an invasive ductal carcinoma which appeared low grade. There was no evidence of angiolymphatic invasion.  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  The prognostic profile showed the tumor to be ER positive at 84%, PR positive at 85% with a low proliferation marker at 11% and Her-2 negative by CISH with a ratio of 1.04.    The patient was referred to Dr. Barry Dienes and bilateral breast MRIs were obtained January 24th.  This showed a large area of patchy, non-mass like low grade enhancement in the upper outer quadrant of the right breast, extending up to 10.7 cm maximally.  There were no abnormal appearing lymph nodes and no other areas of enhancement suspicious for malignancy in either breast.   The patient's  subsequent history is as detailed below.  INTERVAL HISTORY: Miranda Orozco returns today for follow-up and treatment of her estrogen receptor positive breast cancer.  She continues on observation alone. Her last mammogram was at [facility], 07/16/2018, with results pending.  She underwent echocardiogram 01/02/18 showing: LV EF: 60% -   65%    REVIEW OF SYSTEMS: Miranda Orozco reports that for exercise, walking more, she walks about 30 minutes 3-4 times a week more a less a mile. Is trying to get weight down by eating healthier and portion control has lost a few pounds. She denies unusual headaches, visual changes, nausea, vomiting, or dizziness. There has been no unusual cough, phlegm production, or pleurisy. This been no change in bowel or bladder habits. She denies unexplained fatigue or unexplained weight loss, bleeding, rash, or fever. A detailed review of systems was otherwise stable.   PAST MEDICAL HISTORY: Past Medical History:  Diagnosis Date  . Arthritis, rheumatic, acute or subacute 10/02/2009   RA  . BREAST CANCER, HX OF 10/02/2009  . Disorder of bone and cartilage 10/02/2009   Qualifier: Diagnosis of  By: [name] MD, [name]   . Esophageal reflux 10/02/2009  . Hives 09/18/2014  . MALIGNANT NEOPLASM OF BREAST UNSPECIFIED SITE 10/02/2009   RIGHT CHMOE AND RADIATION DONE  . Obesity   . OSTEOPENIA 10/02/2009  Significant for a diagnosis of fibroadenoma made by left breast biopsy in June 2007, a history of C-section times two, a history of surgery for significant left chest wall cellulitis apparently arising from a sebaceous cyst, history of degenerative disk disease and a history of rheumatoid arthritis  PAST SURGICAL HISTORY: Past Surgical History:  Procedure Laterality Date  . BREAST LUMPECTOMY  03/20/10   for margin  . BREAST LUMPECTOMY  03/26/2010   for margin  . CERVICAL CONIZATION W/BX Bilateral 02/06/2018   Procedure: CONIZATION CERVIX WITH BIOPSY;  Surgeon: Eldred Manges, MD;  Location: [facility];  Service: Gynecology;  Laterality: Bilateral;  . [place], 2001  . COLOMSCOPY  2018  . DILATATION & CURRETTAGE/HYSTEROSCOPY WITH RESECTOCOPE N/A 02/19/2013   Procedure: DILATATION & CURETTAGE/HYSTEROSCOPY WITH RESECTION OF POLYP;  Surgeon: Eldred Manges, MD;  Location: [facility];  Service: Gynecology;  Laterality: N/A;  . MASTECTOMY PARTIAL / LUMPECTOMY W/ AXILLARY LYMPHADENECTOMY  03/12/2010   Right w SLN Dr Margot Chimes  . SIMPLE MASTECTOMY  03/31/2011   For margin    FAMILY HISTORY Family History  Problem Relation Age of Onset  . Hypertension Mother   . Hyperlipidemia Mother   . Hypertension Father   . Diabetes Father   . Cancer Father        lung cancer, smoker  . Arthritis Other   . Diabetes Other   . Hypertension Other   . Colon cancer Neg Hx   . Stomach cancer Neg Hx   The patient's father is alive with has a history of type 2 diabetes, hypertension, and hypercholesterolemia.  The patient's mother is alive, with a history of hypertension.  GYNECOLOGIC HISTORY: She is Gx, P3, first pregnancy age 35.  She was premenopausal at the time of her breast cancer diagnosis.  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  Her husband [name] of course is our Social research officer, government at [employer]. Daughter in getting married next year, she met her fiance in college who works at [employer].   ADVANCED DIRECTIVES: Not in place  HEALTH MAINTENANCE: Social History   Tobacco Use  . Smoking status: Former Smoker    Packs/day: 0.50    Years: 1.00    Pack years: 0.50    Types: Cigarettes    Last attempt to quit: 10/17/1981    Years since quitting: 36.7  . Smokeless tobacco: Never Used  Substance Use Topics  . Alcohol use: Yes    Alcohol/week: 2.0 - 3.0 standard drinks    Types: 2 - 3 Glasses of wine per week  . Drug use: No     Colonoscopy:  May 2018/[facility]  PAP: Up-to-date   Bone density: 04/02/2013,  normal  Lipid panel:  No Known Allergies  Current Outpatient Medications  Medication Sig Dispense Refill  . albuterol (PROVENTIL HFA;VENTOLIN HFA) 108 (90 Base) MCG/ACT inhaler Inhale 2 puffs into the lungs every 6 (six) hours as needed for wheezing or shortness of breath. 1 Inhaler 0  . ALPRAZolam (XANAX) 0.25 MG tablet Take 1 tablet (0.25 mg total) by mouth 2 (two) times daily as needed for anxiety or sleep. 20 tablet 1  . Multiple Vitamins-Minerals (MULTIVITAMIN WITH MINERALS) tablet Take 1 tablet by mouth daily.     No current facility-administered medications for this visit.     OBJECTIVE: Middle-aged white woman who appears well  Vitals:   07/17/18 1112  BP: (!) 139/91  Pulse: 87  Resp: 19  Temp: 98.3 F (36.8 C)  SpO2: 97%     Body mass index is 33.1 kg/m.    ECOG FS: 1 Filed Weights   07/17/18 1112  Weight: 205 lb 1.6 oz (93 kg)  ECOG FS: 0 -  Asymptomatic   Sclerae unicteric, pupils round and equal No cervical or supraclavicular adenopathy Lungs no rales or rhonchi Heart regular rate and rhythm Abd soft, nontender, positive bowel sounds MSK no focal spinal tenderness, no upper extremity lymphedema Neuro: nonfocal, well oriented, appropriate affect Breasts: That is post right mastectomy with no evidence of chest wall recurrence.  Left breast benign.  Both axillae are benign.  LAB RESULTS: Lab Results  Component Value Date   WBC 6.7 06/29/2018   NEUTROABS 4.0 06/29/2018   HGB 13.2 06/29/2018   HCT 40.4 06/29/2018   MCV 89.2 06/29/2018   PLT 257 06/29/2018      Chemistry      Component Value Date/Time   NA 140 06/29/2018 0835   NA 139 07/02/2017 1211   K 3.9 06/29/2018 0835   K 3.9 07/02/2017 1211   CL 105 06/29/2018 0835   CO2 26 06/29/2018 0835   CO2 28 07/02/2017 1211   BUN 13 06/29/2018 0835   BUN 14.1 07/02/2017 1211   CREATININE 0.85 06/29/2018 0835   CREATININE 0.8 07/02/2017 1211      Component Value Date/Time   CALCIUM 9.0 06/29/2018  0835   CALCIUM 9.5 07/02/2017 1211   ALKPHOS 90 06/29/2018 0835   ALKPHOS 96 07/02/2017 1211   AST 23 06/29/2018 0835   AST 38 (H) 07/02/2017 1211   ALT 33 06/29/2018 0835   ALT 45 07/02/2017 1211   BILITOT 0.3 06/29/2018 0835   BILITOT 0.41 07/02/2017 1211       Lab Results  Component Value Date   LABCA2 21 09/05/2011    No components found for: [id number]  No results for input(s): INR in the last 168 hours.  Urinalysis    Component Value Date/Time   LABSPEC 1.025 03/20/2011 1405   PHURINE 6.0 03/20/2011 1405   PHURINE 5.0 12/31/2010 1121   GLUCOSEU 250 (A) 12/31/2010 1121   HGBUR Moderate 03/20/2011 1405   HGBUR MODERATE (A) 12/31/2010 1121   BILIRUBINUR n 09/10/2013 1608   BILIRUBINUR Negative 03/20/2011 1405   KETONESUR Negative 03/20/2011 1405   KETONESUR NEGATIVE 12/31/2010 1121   PROTEINUR n 09/10/2013 1608   PROTEINUR Negative 03/20/2011 1405   PROTEINUR 30 (A) 12/31/2010 1121   UROBILINOGEN 0.2 09/10/2013 1608   UROBILINOGEN 1.0 12/31/2010 1121   NITRITE n 09/10/2013 1608   NITRITE Negative 03/20/2011 1405   NITRITE POSITIVE (A) 12/31/2010 1121   LEUKOCYTESUR small (1+) 09/10/2013 1608   LEUKOCYTESUR Moderate 03/20/2011 1405    STUDIES: Left mammography yesterday results are pending.  ASSESSMENT: 57 y.o. [place] woman status post right breast biopsy January of 2011 for a clinically T3 N0 (stage IIB) invasive ductal carcinoma, grade 1, strongly estrogen and progesterone receptor positive, HER-2 negative, with an MIB-1 of 18%, in the setting of extensive ductal carcinoma in situ;  (1) treated neoadjuvantly with doxorubicin/ cyclophosphamide in dose dense fashion x4 followed by weekly paclitaxel x12   (2) followed by lumpectomy and sentinel lymph node sampling 03/24/2010 showing no residual invasive disease but a 2.8 cm area of ductal carcinoma in situ, with positive margins.  All 4 sentinel lymph nodes were negative.  Margins could not be cleared despite  repeated attempts and she underwent definitive mastectomy August of 2011 showing no residual carcinoma.    (3) She completed post mastectomy radiation including the right supraclavicular area in November of 2011   (4) started tamoxifen November 2011, stopped it August 2016 due to concerns regarding a rash.  PLAN:  Terrina is  now a little over 8 years out from definitive surgery for her breast cancer with no evidence of disease recurrence.  This is very favorable.  We reviewed the recent [place] of medicine article which discussed a breast MRI and this was really the indicating for her  She does had some issues regarding prediabetes and metabolic syndrome and is beginning to change diet and exercise.  I strongly recommended she avoid all carbs.  She is walking more which is very favorable.  This is been a difficult year in many ways but the good news is that her daughter is getting married next year and that is exciting.  She will see me again a year from now.  She knows to call for any issues that may develop before that visit.  Magrinat, Virgie Dad, MD  07/17/18 11:38 AM Medical Oncology and Hematology [facility] [address] [place], [place] [address] Tel. [phone]    Fax. [phone]    Elie Goody, am acting as scribe for Dr. Virgie Dad. Magrinat.   I, Lurline Del MD, have reviewed the above documentation for accuracy and completeness, and I agree with the above.

## 2018-07-16 DIAGNOSIS — H43811 Vitreous degeneration, right eye: Secondary | ICD-10-CM | POA: Diagnosis not present

## 2018-07-16 DIAGNOSIS — Z853 Personal history of malignant neoplasm of breast: Secondary | ICD-10-CM | POA: Diagnosis not present

## 2018-07-16 DIAGNOSIS — Z1231 Encounter for screening mammogram for malignant neoplasm of breast: Secondary | ICD-10-CM | POA: Diagnosis not present

## 2018-07-16 DIAGNOSIS — H35363 Drusen (degenerative) of macula, bilateral: Secondary | ICD-10-CM | POA: Diagnosis not present

## 2018-07-16 DIAGNOSIS — H43391 Other vitreous opacities, right eye: Secondary | ICD-10-CM | POA: Diagnosis not present

## 2018-07-17 ENCOUNTER — Telehealth: Payer: Self-pay | Admitting: Oncology

## 2018-07-17 ENCOUNTER — Inpatient Hospital Stay: Payer: 59 | Attending: Oncology | Admitting: Oncology

## 2018-07-17 VITALS — BP 139/91 | HR 87 | Temp 98.3°F | Resp 19 | Ht 66.0 in | Wt 205.1 lb

## 2018-07-17 DIAGNOSIS — C50411 Malignant neoplasm of upper-outer quadrant of right female breast: Secondary | ICD-10-CM

## 2018-07-17 DIAGNOSIS — Z87891 Personal history of nicotine dependence: Secondary | ICD-10-CM | POA: Diagnosis not present

## 2018-07-17 DIAGNOSIS — Z170 Estrogen receptor positive status [ER+]: Secondary | ICD-10-CM | POA: Diagnosis not present

## 2018-07-17 DIAGNOSIS — Z853 Personal history of malignant neoplasm of breast: Secondary | ICD-10-CM | POA: Diagnosis not present

## 2018-07-17 DIAGNOSIS — Z79899 Other long term (current) drug therapy: Secondary | ICD-10-CM | POA: Insufficient documentation

## 2018-07-17 DIAGNOSIS — Z9011 Acquired absence of right breast and nipple: Secondary | ICD-10-CM | POA: Insufficient documentation

## 2018-07-17 DIAGNOSIS — Z923 Personal history of irradiation: Secondary | ICD-10-CM | POA: Diagnosis not present

## 2018-07-17 NOTE — Telephone Encounter (Signed)
Gave avs and calendar ° °

## 2018-07-20 ENCOUNTER — Telehealth: Payer: Self-pay | Admitting: *Deleted

## 2018-07-20 ENCOUNTER — Other Ambulatory Visit: Payer: Self-pay | Admitting: *Deleted

## 2018-07-20 ENCOUNTER — Encounter: Payer: Self-pay | Admitting: Oncology

## 2018-07-20 DIAGNOSIS — R922 Inconclusive mammogram: Secondary | ICD-10-CM

## 2018-07-20 DIAGNOSIS — Z0000 Encounter for general adult medical examination without abnormal findings: Secondary | ICD-10-CM

## 2018-07-20 NOTE — Telephone Encounter (Signed)
This RN spoke with pt per recent mammogram and known history of dense breast.- pt would like to pursue obtaining an ABBREVIATED MRI.  Order placed.  No further needs at this time.

## 2018-07-20 NOTE — Telephone Encounter (Signed)
Received Mammogram results from [facility], Impression Incomplete - Additional Evaluation Needed; forwarded to provider/SLS 12/16

## 2018-07-22 ENCOUNTER — Encounter: Payer: Self-pay | Admitting: Oncology

## 2018-07-22 DIAGNOSIS — R928 Other abnormal and inconclusive findings on diagnostic imaging of breast: Secondary | ICD-10-CM | POA: Diagnosis not present

## 2018-07-22 LAB — HM MAMMOGRAPHY

## 2018-07-23 ENCOUNTER — Other Ambulatory Visit: Payer: Self-pay | Admitting: Oncology

## 2018-07-25 ENCOUNTER — Ambulatory Visit: Payer: 59

## 2018-07-27 ENCOUNTER — Telehealth: Payer: Self-pay | Admitting: *Deleted

## 2018-07-27 NOTE — Telephone Encounter (Signed)
Received Mammogram results from [place]; forwarded to provider/SLS 12/23

## 2018-08-04 ENCOUNTER — Encounter: Payer: Self-pay | Admitting: Family Medicine

## 2018-09-01 ENCOUNTER — Ambulatory Visit: Payer: 59 | Admitting: Family Medicine

## 2018-09-04 ENCOUNTER — Ambulatory Visit: Payer: 59 | Admitting: Family Medicine

## 2018-09-11 ENCOUNTER — Other Ambulatory Visit: Payer: Self-pay

## 2018-09-11 DIAGNOSIS — D485 Neoplasm of uncertain behavior of skin: Secondary | ICD-10-CM | POA: Diagnosis not present

## 2018-10-02 ENCOUNTER — Telehealth: Payer: Self-pay

## 2018-10-02 NOTE — Telephone Encounter (Signed)
Copied from [place] [id number]. Topic: General - Inquiry >> Oct 02, 2018 11:51 AM [name] wrote: Reason for CRM: Pt asked if Dr. Charlett Blake or her assistant could give her a call. She did not want to give any information. Please advise. CB#[phone]

## 2018-10-05 ENCOUNTER — Ambulatory Visit (INDEPENDENT_AMBULATORY_CARE_PROVIDER_SITE_OTHER): Payer: 59 | Admitting: Family Medicine

## 2018-10-05 ENCOUNTER — Encounter: Payer: Self-pay | Admitting: Family Medicine

## 2018-10-05 VITALS — BP 120/86 | HR 80 | Temp 98.0°F | Resp 18 | Wt 199.2 lb

## 2018-10-05 DIAGNOSIS — E1169 Type 2 diabetes mellitus with other specified complication: Secondary | ICD-10-CM | POA: Diagnosis not present

## 2018-10-05 DIAGNOSIS — F419 Anxiety disorder, unspecified: Secondary | ICD-10-CM

## 2018-10-05 DIAGNOSIS — D649 Anemia, unspecified: Secondary | ICD-10-CM

## 2018-10-05 DIAGNOSIS — E669 Obesity, unspecified: Secondary | ICD-10-CM

## 2018-10-05 DIAGNOSIS — E782 Mixed hyperlipidemia: Secondary | ICD-10-CM | POA: Diagnosis not present

## 2018-10-05 DIAGNOSIS — R739 Hyperglycemia, unspecified: Secondary | ICD-10-CM

## 2018-10-05 MED ORDER — BLOOD GLUCOSE METER KIT: PACK | 0 refills | Status: DC

## 2018-10-05 MED FILL — SHINGRIX 50 MCG SUS: 50 | 1 days supply | Qty: 1 | Fill #0

## 2018-10-05 MED FILL — FREESTYLE LITE TEST STRIP: 25 days supply | Qty: 100 | Fill #0

## 2018-10-05 MED FILL — FREESTYLE LANCETS: 25 days supply | Qty: 100 | Fill #0

## 2018-10-05 MED FILL — FREESTYLE LITE METER: 20 days supply | Qty: 1 | Fill #0

## 2018-10-05 NOTE — Assessment & Plan Note (Signed)
minimize simple carbs. Increase exercise as tolerated.  

## 2018-10-05 NOTE — Progress Notes (Signed)
Subjective:    Patient ID: Miranda Orozco, female    DOB: 07/29/1961, 58 y.o.   MRN: [id number]  No chief complaint on file.   HPI Patient is in today for follow-up.  She has been under a great deal of stress recently.  She had an abnormal mammogram which on rapid MRI was found to be benign but nonetheless this was stressful.  Her husband is just been treated for stage III prostate cancer.  They are in the process of trying get their son with severe disabilities placed in a group home as an adult.  Their youngest daughter has been ill and has had some abnormal lab work and their oldest daughter is preparing for a wedding this fall.  Patient herself denies any recent febrile illness or acute hospitalizations. Denies CP/palp/SOB/HA/fevers/GI or GU c/o. Taking meds as prescribed  Past Medical History:  Diagnosis Date  . Arthritis, rheumatic, acute or subacute 10/02/2009   RA  . BREAST CANCER, HX OF 10/02/2009  . Disorder of bone and cartilage 10/02/2009   Qualifier: Diagnosis of  By: [name] MD, [name]   . Esophageal reflux 10/02/2009  . Hives 09/18/2014  . MALIGNANT NEOPLASM OF BREAST UNSPECIFIED SITE 10/02/2009   RIGHT CHMOE AND RADIATION DONE  . Obesity   . OSTEOPENIA 10/02/2009    Past Surgical History:  Procedure Laterality Date  . BREAST LUMPECTOMY  03/20/10   for margin  . BREAST LUMPECTOMY  03/26/2010   for margin  . CERVICAL CONIZATION W/BX Bilateral 02/06/2018   Procedure: CONIZATION CERVIX WITH BIOPSY;  Surgeon: Eldred Manges, MD;  Location: [facility];  Service: Gynecology;  Laterality: Bilateral;  . [place], 2001  . COLOMSCOPY  2018  . DILATATION & CURRETTAGE/HYSTEROSCOPY WITH RESECTOCOPE N/A 02/19/2013   Procedure: DILATATION & CURETTAGE/HYSTEROSCOPY WITH RESECTION OF POLYP;  Surgeon: Eldred Manges, MD;  Location: [facility];  Service: Gynecology;  Laterality: N/A;  . MASTECTOMY PARTIAL / LUMPECTOMY W/ AXILLARY LYMPHADENECTOMY  03/12/2010   Right w SLN Dr Margot Chimes  . SIMPLE MASTECTOMY  03/31/2011   For margin    Family History  Problem Relation Age of Onset  . Hypertension Mother   . Hyperlipidemia Mother   . Hypertension Father   . Diabetes Father   . Cancer Father        lung cancer, smoker  . Arthritis Other   . Diabetes Other   . Hypertension Other   . Colon cancer Neg Hx   . Stomach cancer Neg Hx     Social History   Socioeconomic History  . Marital status: Married    Spouse name: Not on file  . Number of children: Not on file  . Years of education: Not on file  . Highest education level: Not on file  Occupational History  . Not on file  Social Needs  . Financial resource strain: Not on file  . Food insecurity:    Worry: Not on file    Inability: Not on file  . Transportation needs:    Medical: Not on file    Non-medical: Not on file  Tobacco Use  . Smoking status: Former Smoker    Packs/day: 0.50    Years: 1.00    Pack years: 0.50    Types: Cigarettes    Last attempt to quit: 10/17/1981    Years since quitting: 36.9  . Smokeless tobacco: Never Used  Substance and Sexual Activity  . Alcohol use: Yes  Alcohol/week: 2.0 - 3.0 standard drinks    Types: 2 - 3 Glasses of wine per week  . Drug use: No  . Sexual activity: Yes    Birth control/protection: None    Comment: lives with husband and children, no dietary restriction  Lifestyle  . Physical activity:    Days per week: Not on file    Minutes per session: Not on file  . Stress: Not on file  Relationships  . Social connections:    Talks on phone: Not on file    Gets together: Not on file    Attends religious service: Not on file    Active member of club or organization: Not on file    Attends meetings of clubs or organizations: Not on file    Relationship status: Not on file  . Intimate partner violence:    Fear of current or ex partner: Not on file    Emotionally abused: Not on file    Physically abused: Not on file    Forced  sexual activity: Not on file  Other Topics Concern  . Not on file  Social History Narrative  . Not on file    Outpatient Medications Prior to Visit  Medication Sig Dispense Refill  . albuterol (PROVENTIL HFA;VENTOLIN HFA) 108 (90 Base) MCG/ACT inhaler Inhale 2 puffs into the lungs every 6 (six) hours as needed for wheezing or shortness of breath. 1 Inhaler 0  . ALPRAZolam (XANAX) 0.25 MG tablet Take 1 tablet (0.25 mg total) by mouth 2 (two) times daily as needed for anxiety or sleep. 20 tablet 1  . Multiple Vitamins-Minerals (MULTIVITAMIN WITH MINERALS) tablet Take 1 tablet by mouth daily.     No facility-administered medications prior to visit.     No Known Allergies  Review of Systems  Constitutional: Positive for malaise/fatigue. Negative for fever.  HENT: Negative for congestion.   Eyes: Negative for blurred vision.  Respiratory: Negative for shortness of breath.   Cardiovascular: Negative for chest pain, palpitations and leg swelling.  Gastrointestinal: Negative for abdominal pain, blood in stool and nausea.  Genitourinary: Negative for dysuria and frequency.  Musculoskeletal: Negative for falls.  Skin: Negative for rash.  Neurological: Negative for dizziness, loss of consciousness and headaches.  Endo/Heme/Allergies: Negative for environmental allergies.  Psychiatric/Behavioral: Negative for depression. The patient is nervous/anxious.        Objective:    Physical Exam Constitutional:      General: She is not in acute distress.    Appearance: She is well-developed.  HENT:     Head: Normocephalic and atraumatic.  Eyes:     Conjunctiva/sclera: Conjunctivae normal.  Neck:     Musculoskeletal: Neck supple.     Thyroid: No thyromegaly.  Cardiovascular:     Rate and Rhythm: Normal rate and regular rhythm.     Heart sounds: Normal heart sounds. No murmur.  Pulmonary:     Effort: Pulmonary effort is normal. No respiratory distress.     Breath sounds: Normal breath  sounds.  Abdominal:     General: Bowel sounds are normal. There is no distension.     Palpations: Abdomen is soft. There is no mass.     Tenderness: There is no abdominal tenderness.  Lymphadenopathy:     Cervical: No cervical adenopathy.  Skin:    General: Skin is warm and dry.  Neurological:     Mental Status: She is alert and oriented to person, place, and time.  Psychiatric:  Behavior: Behavior normal.     BP 120/86 (BP Location: Left Arm, Patient Position: Sitting, Cuff Size: Normal)   Pulse 80   Temp 98 F (36.7 C) (Oral)   Resp 18   Wt 199 lb 3.2 oz (90.4 kg)   SpO2 98%   BMI 32.15 kg/m  Wt Readings from Last 3 Encounters:  10/05/18 199 lb 3.2 oz (90.4 kg)  07/17/18 205 lb 1.6 oz (93 kg)  06/30/18 207 lb 3.2 oz (94 kg)     Lab Results  Component Value Date   WBC 6.7 06/29/2018   HGB 13.2 06/29/2018   HCT 40.4 06/29/2018   PLT 257 06/29/2018   GLUCOSE 202 (H) 06/29/2018   CHOL 240 (H) 12/16/2017   TRIG 360.0 (H) 12/16/2017   HDL 43.60 12/16/2017   LDLDIRECT 126.0 12/16/2017   ALT 33 06/29/2018   AST 23 06/29/2018   NA 140 06/29/2018   K 3.9 06/29/2018   CL 105 06/29/2018   CREATININE 0.85 06/29/2018   BUN 13 06/29/2018   CO2 26 06/29/2018   TSH 3.16 12/16/2017   HGBA1C 6.2 12/18/2017    Lab Results  Component Value Date   TSH 3.16 12/16/2017   Lab Results  Component Value Date   WBC 6.7 06/29/2018   HGB 13.2 06/29/2018   HCT 40.4 06/29/2018   MCV 89.2 06/29/2018   PLT 257 06/29/2018   Lab Results  Component Value Date   NA 140 06/29/2018   K 3.9 06/29/2018   CHLORIDE 103 07/02/2017   CO2 26 06/29/2018   GLUCOSE 202 (H) 06/29/2018   BUN 13 06/29/2018   CREATININE 0.85 06/29/2018   BILITOT 0.3 06/29/2018   ALKPHOS 90 06/29/2018   AST 23 06/29/2018   ALT 33 06/29/2018   PROT 7.9 06/29/2018   ALBUMIN 3.9 06/29/2018   CALCIUM 9.0 06/29/2018   ANIONGAP 9 06/29/2018   EGFR >60 07/02/2017   GFR 79.92 12/16/2017   Lab Results    Component Value Date   CHOL 240 (H) 12/16/2017   Lab Results  Component Value Date   HDL 43.60 12/16/2017   No results found for: [facility] Lab Results  Component Value Date   TRIG 360.0 (H) 12/16/2017   Lab Results  Component Value Date   CHOLHDL 6 12/16/2017   Lab Results  Component Value Date   HGBA1C 6.2 12/18/2017       Assessment & Plan:   Problem List Items Addressed This Visit    Anemia    Check CBC       Relevant Orders   CBC   Obesity (BMI 30.0-34.9)    Has been working hard at eating better since October, has added yoga and walking. Continue the same and referred to healthy weight and wellness      Hyperlipidemia, mixed    Encouraged heart healthy diet, increase exercise, avoid trans fats, consider a krill oil cap daily      Relevant Orders   Lipid panel   Anxiety    Has been avery stressful time with her husband's diagnosis of prostate cancer. They are searching for a group home for their son with severe disability, their youngest daughter is struggling with anxiety and a recent low lymphocyte count. Finally her oldest daughter is getting married in September. She feels she is managing well with current meds no changes for now       Hyperglycemia     minimize simple carbs. Increase exercise as tolerated.  Relevant Orders   Hemoglobin A1c   Comprehensive metabolic panel   TSH    Other Visit Diagnoses    Type 2 diabetes mellitus with other specified complication, without long-term current use of insulin ([place])    -  Primary   Relevant Orders   Amb Ref to Medical Weight Management   Amb ref to Medical Nutrition Therapy-MNT   Obesity without serious comorbidity, unspecified classification, unspecified obesity type       Relevant Orders   Amb Ref to Medical Weight Management      I am having Miranda Orozco start on blood glucose meter kit and supplies. I am also having her maintain her multivitamin with minerals, albuterol, and  ALPRAZolam.  Meds ordered this encounter  Medications  . blood glucose meter kit and supplies    Sig: Dispense based on patient and insurance preference. Use up to four times daily as directed. Dz:E11.9 Check BS TID prn    Dispense:  1 each    Refill:  0    Order Specific Question:   Number of strips    Answer:   100    Order Specific Question:   Number of lancets    Answer:   100     Penni Homans, MD

## 2018-10-05 NOTE — Assessment & Plan Note (Addendum)
Has been working hard at eating better since October, has added yoga and walking. Continue the same and referred to healthy weight and wellness

## 2018-10-05 NOTE — Patient Instructions (Signed)
Shingrix is the new shingles shot, 2 shots over 2-6 months at pharmacy or office Carbohydrate Counting for Diabetes Mellitus, Adult  Carbohydrate counting is a method of keeping track of how many carbohydrates you eat. Eating carbohydrates naturally increases the amount of sugar (glucose) in the blood. Counting how many carbohydrates you eat helps keep your blood glucose within normal limits, which helps you manage your diabetes (diabetes mellitus). It is important to know how many carbohydrates you can safely have in each meal. This is different for every person. A diet and nutrition specialist (registered dietitian) can help you make a meal plan and calculate how many carbohydrates you should have at each meal and snack. Carbohydrates are found in the following foods:  Grains, such as breads and cereals.  Dried beans and soy products.  Starchy vegetables, such as potatoes, peas, and corn.  Fruit and fruit juices.  Milk and yogurt.  Sweets and snack foods, such as cake, cookies, candy, chips, and soft drinks. How do I count carbohydrates? There are two ways to count carbohydrates in food. You can use either of the methods or a combination of both. Reading "Nutrition Facts" on packaged food The "Nutrition Facts" list is included on the labels of almost all packaged foods and beverages in the [place] It includes:  The serving size.  Information about nutrients in each serving, including the grams (g) of carbohydrate per serving. To use the "Nutrition Facts":  Decide how many servings you will have.  Multiply the number of servings by the number of carbohydrates per serving.  The resulting number is the total amount of carbohydrates that you will be having. Learning standard serving sizes of other foods When you eat carbohydrate foods that are not packaged or do not include "Nutrition Facts" on the label, you need to measure the servings in order to count the amount of  carbohydrates:  Measure the foods that you will eat with a food scale or measuring cup, if needed.  Decide how many standard-size servings you will eat.  Multiply the number of servings by 15. Most carbohydrate-rich foods have about 15 g of carbohydrates per serving. ? For example, if you eat 8 oz (170 g) of strawberries, you will have eaten 2 servings and 30 g of carbohydrates (2 servings x 15 g = 30 g).  For foods that have more than one food mixed, such as soups and casseroles, you must count the carbohydrates in each food that is included. The following list contains standard serving sizes of common carbohydrate-rich foods. Each of these servings has about 15 g of carbohydrates:   hamburger bun or  English muffin.   oz (15 mL) syrup.   oz (14 g) jelly.  1 slice of bread.  1 six-inch tortilla.  3 oz (85 g) cooked rice or pasta.  4 oz (113 g) cooked dried beans.  4 oz (113 g) starchy vegetable, such as peas, corn, or potatoes.  4 oz (113 g) hot cereal.  4 oz (113 g) mashed potatoes or  of a large baked potato.  4 oz (113 g) canned or frozen fruit.  4 oz (120 mL) fruit juice.  4-6 crackers.  6 chicken nuggets.  6 oz (170 g) unsweetened dry cereal.  6 oz (170 g) plain fat-free yogurt or yogurt sweetened with artificial sweeteners.  8 oz (240 mL) milk.  8 oz (170 g) fresh fruit or one small piece of fruit.  24 oz (680 g) popped popcorn. Example of carbohydrate  counting Sample meal  3 oz (85 g) chicken breast.  6 oz (170 g) brown rice.  4 oz (113 g) corn.  8 oz (240 mL) milk.  8 oz (170 g) strawberries with sugar-free whipped topping. Carbohydrate calculation 1. Identify the foods that contain carbohydrates: ? Rice. ? Corn. ? Milk. ? Strawberries. 2. Calculate how many servings you have of each food: ? 2 servings rice. ? 1 serving corn. ? 1 serving milk. ? 1 serving strawberries. 3. Multiply each number of servings by 15 g: ? 2 servings rice  x 15 g = 30 g. ? 1 serving corn x 15 g = 15 g. ? 1 serving milk x 15 g = 15 g. ? 1 serving strawberries x 15 g = 15 g. 4. Add together all of the amounts to find the total grams of carbohydrates eaten: ? 30 g + 15 g + 15 g + 15 g = 75 g of carbohydrates total. Summary  Carbohydrate counting is a method of keeping track of how many carbohydrates you eat.  Eating carbohydrates naturally increases the amount of sugar (glucose) in the blood.  Counting how many carbohydrates you eat helps keep your blood glucose within normal limits, which helps you manage your diabetes.  A diet and nutrition specialist (registered dietitian) can help you make a meal plan and calculate how many carbohydrates you should have at each meal and snack. This information is not intended to replace advice given to you by your health care provider. Make sure you discuss any questions you have with your health care provider. Document Released: 07/22/2005 Document Revised: 01/29/2017 Document Reviewed: 01/03/2016 Elsevier Interactive Patient Education  2019 [employer].

## 2018-10-05 NOTE — Assessment & Plan Note (Signed)
Check CBC 

## 2018-10-05 NOTE — Assessment & Plan Note (Signed)
Encouraged heart healthy diet, increase exercise, avoid trans fats, consider a krill oil cap daily 

## 2018-10-05 NOTE — Assessment & Plan Note (Signed)
Has been avery stressful time with her husband's diagnosis of prostate cancer. They are searching for a group home for their son with severe disability, their youngest daughter is struggling with anxiety and a recent low lymphocyte count. Finally her oldest daughter is getting married in September. She feels she is managing well with current meds no changes for now

## 2018-10-06 LAB — LIPID PANEL
Cholesterol: 223 mg/dL — ABNORMAL HIGH (ref 0–200)
HDL: 49.6 mg/dL (ref >39.00)
Total CHOL/HDL Ratio: 5
Triglycerides: 402 mg/dL — ABNORMAL HIGH (ref 0.0–149.0)

## 2018-10-06 LAB — LDL CHOLESTEROL, DIRECT: Direct LDL: 120 mg/dL

## 2018-10-06 LAB — TSH: TSH: 2.54 u[IU]/mL (ref 0.35–4.50)

## 2018-10-06 LAB — CBC
HCT: 39.7 % (ref 36.0–46.0)
Hemoglobin: 13.3 g/dL (ref 12.0–15.0)
MCHC: 33.5 g/dL (ref 30.0–36.0)
MCV: 90.6 fl (ref 78.0–100.0)
PLATELETS: 288 10*3/uL (ref 150.0–400.0)
RBC: 4.39 Mil/uL (ref 3.87–5.11)
RDW: 14.6 % (ref 11.5–15.5)
WBC: 8.3 10*3/uL (ref 4.0–10.5)

## 2018-10-06 LAB — COMPREHENSIVE METABOLIC PANEL
ALT: 24 U/L (ref 0–35)
AST: 21 U/L (ref 0–37)
Albumin: 4.7 g/dL (ref 3.5–5.2)
Alkaline Phosphatase: 92 U/L (ref 39–117)
BILIRUBIN TOTAL: 0.3 mg/dL (ref 0.2–1.2)
BUN: 17 mg/dL (ref 6–23)
CALCIUM: 9.8 mg/dL (ref 8.4–10.5)
CO2: 29 mEq/L (ref 19–32)
Chloride: 100 mEq/L (ref 96–112)
Creatinine, Ser: 0.73 mg/dL (ref 0.40–1.20)
GFR: 82.14 mL/min (ref >60.00)
Glucose, Bld: 96 mg/dL (ref 70–99)
Potassium: 4.2 mEq/L (ref 3.5–5.1)
Sodium: 138 mEq/L (ref 135–145)
Total Protein: 7.8 g/dL (ref 6.0–8.3)

## 2018-10-06 LAB — HEMOGLOBIN A1C: Hgb A1c MFr Bld: 5.7 % (ref 4.6–6.5)

## 2018-10-15 ENCOUNTER — Ambulatory Visit: Payer: 59 | Admitting: Registered"

## 2018-11-10 ENCOUNTER — Telehealth: Payer: 59 | Admitting: Physician Assistant

## 2018-11-10 ENCOUNTER — Other Ambulatory Visit: Payer: Self-pay

## 2018-11-10 ENCOUNTER — Ambulatory Visit: Payer: Self-pay

## 2018-11-10 ENCOUNTER — Ambulatory Visit (INDEPENDENT_AMBULATORY_CARE_PROVIDER_SITE_OTHER): Payer: 59 | Admitting: Family Medicine

## 2018-11-10 DIAGNOSIS — J40 Bronchitis, not specified as acute or chronic: Secondary | ICD-10-CM

## 2018-11-10 DIAGNOSIS — R430 Anosmia: Secondary | ICD-10-CM

## 2018-11-10 DIAGNOSIS — R0981 Nasal congestion: Secondary | ICD-10-CM

## 2018-11-10 MED ORDER — DOXYCYCLINE HYCLATE 100 MG PO TABS: 100.0000 mg | ORAL_TABLET | Freq: Two times a day (BID) | ORAL | 0 refills | Status: DC

## 2018-11-10 MED ORDER — BENZONATATE 200 MG PO CAPS: 200.0000 mg | ORAL_CAPSULE | Freq: Three times a day (TID) | ORAL | 1 refills | Status: DC | PRN

## 2018-11-10 MED ORDER — HYDROCODONE-HOMATROPINE 5-1.5 MG/5ML PO SYRP: 5.0000 mL | ORAL_SOLUTION | Freq: Three times a day (TID) | ORAL | 0 refills | Status: DC | PRN

## 2018-11-10 MED ORDER — SPACER/AERO-HOLDING CHAMBERS DEVI: 1.0000 | Freq: Four times a day (QID) | 1 refills | Status: DC | PRN

## 2018-11-10 MED FILL — DOXYCYCLINE HYCLATE 100 MG: 100 | 10 days supply | Qty: 20 | Fill #0

## 2018-11-10 MED FILL — HYDROCODONE-HOMATROPINE SYR: 5-1.5 | 8 days supply | Qty: 120 | Fill #0

## 2018-11-10 MED FILL — BENZONATATE 200 MG CAP: 200 | 13 days supply | Qty: 40 | Fill #0

## 2018-11-10 MED FILL — AEROCHAMBER: 30 days supply | Qty: 1 | Fill #0

## 2018-11-10 NOTE — Progress Notes (Signed)
Virtual Visit via Video Note  I connected with Miranda Orozco on 11/10/18 at  3:00 PM EDT by a video enabled telemedicine application and verified that I am speaking with the correct person using two identifiers.   I discussed the limitations of evaluation and management by telemedicine and the availability of in person appointments. The patient expressed understanding and agreed to proceed.    Subjective:    Patient ID: Miranda Orozco, female    DOB: [birth year], 58 y.o.   MRN: [id number]  No chief complaint on file.   HPI Patient is in today for evaluation of a cough. It started about 3 days ago and is worsening. No fevers or chills but does note has also lost sense of smell. Minimal head congestion and no sore throat. Her husband did have a scratchy throat a couple of weeks ago but no other symptoms. No nausea or diarrhea, no abdominal pain or SOB. Denies CP/palpHA/fevers/GI or GU c/o. Taking meds as prescribed  Past Medical History:  Diagnosis Date  . Arthritis, rheumatic, acute or subacute 10/02/2009   RA  . BREAST CANCER, HX OF 10/02/2009  . Disorder of bone and cartilage 10/02/2009   Qualifier: Diagnosis of  By: [name] MD, [name]   . Esophageal reflux 10/02/2009  . Hives 09/18/2014  . MALIGNANT NEOPLASM OF BREAST UNSPECIFIED SITE 10/02/2009   RIGHT CHMOE AND RADIATION DONE  . Obesity   . OSTEOPENIA 10/02/2009    Past Surgical History:  Procedure Laterality Date  . BREAST LUMPECTOMY  03/20/10   for margin  . BREAST LUMPECTOMY  03/26/2010   for margin  . CERVICAL CONIZATION W/BX Bilateral 02/06/2018   Procedure: CONIZATION CERVIX WITH BIOPSY;  Surgeon: Eldred Manges, MD;  Location: [facility];  Service: Gynecology;  Laterality: Bilateral;  . [place], 2001  . COLOMSCOPY  2018  . DILATATION & CURRETTAGE/HYSTEROSCOPY WITH RESECTOCOPE N/A 02/19/2013   Procedure: DILATATION & CURETTAGE/HYSTEROSCOPY WITH RESECTION OF POLYP;  Surgeon: Eldred Manges,  MD;  Location: [facility];  Service: Gynecology;  Laterality: N/A;  . MASTECTOMY PARTIAL / LUMPECTOMY W/ AXILLARY LYMPHADENECTOMY  03/12/2010   Right w SLN Dr Margot Chimes  . SIMPLE MASTECTOMY  03/31/2011   For margin    Family History  Problem Relation Age of Onset  . Hypertension Mother   . Hyperlipidemia Mother   . Hypertension Father   . Diabetes Father   . Cancer Father        lung cancer, smoker  . Arthritis Other   . Diabetes Other   . Hypertension Other   . Colon cancer Neg Hx   . Stomach cancer Neg Hx     Social History   Socioeconomic History  . Marital status: Married    Spouse name: Not on file  . Number of children: Not on file  . Years of education: Not on file  . Highest education level: Not on file  Occupational History  . Not on file  Social Needs  . Financial resource strain: Not on file  . Food insecurity:    Worry: Not on file    Inability: Not on file  . Transportation needs:    Medical: Not on file    Non-medical: Not on file  Tobacco Use  . Smoking status: Former Smoker    Packs/day: 0.50    Years: 1.00    Pack years: 0.50    Types: Cigarettes    Last attempt to quit: 10/17/1981  Years since quitting: 37.0  . Smokeless tobacco: Never Used  Substance and Sexual Activity  . Alcohol use: Yes    Alcohol/week: 2.0 - 3.0 standard drinks    Types: 2 - 3 Glasses of wine per week  . Drug use: No  . Sexual activity: Yes    Birth control/protection: None    Comment: lives with husband and children, no dietary restriction  Lifestyle  . Physical activity:    Days per week: Not on file    Minutes per session: Not on file  . Stress: Not on file  Relationships  . Social connections:    Talks on phone: Not on file    Gets together: Not on file    Attends religious service: Not on file    Active member of club or organization: Not on file    Attends meetings of clubs or organizations: Not on file    Relationship status: Not on file  . Intimate partner  violence:    Fear of current or ex partner: Not on file    Emotionally abused: Not on file    Physically abused: Not on file    Forced sexual activity: Not on file  Other Topics Concern  . Not on file  Social History Narrative  . Not on file    Outpatient Medications Prior to Visit  Medication Sig Dispense Refill  . albuterol (PROVENTIL HFA;VENTOLIN HFA) 108 (90 Base) MCG/ACT inhaler Inhale 2 puffs into the lungs every 6 (six) hours as needed for wheezing or shortness of breath. 1 Inhaler 0  . ALPRAZolam (XANAX) 0.25 MG tablet Take 1 tablet (0.25 mg total) by mouth 2 (two) times daily as needed for anxiety or sleep. 20 tablet 1  . blood glucose meter kit and supplies Dispense based on patient and insurance preference. Use up to four times daily as directed. Dz:E11.9 Check BS TID prn 1 each 0  . Multiple Vitamins-Minerals (MULTIVITAMIN WITH MINERALS) tablet Take 1 tablet by mouth daily.     No facility-administered medications prior to visit.     No Known Allergies  Review of Systems  Constitutional: Positive for malaise/fatigue. Negative for fever.  HENT: Negative for congestion.   Eyes: Negative for blurred vision.  Respiratory: Positive for cough. Negative for shortness of breath.   Cardiovascular: Negative for chest pain, palpitations and leg swelling.  Gastrointestinal: Negative for abdominal pain, blood in stool and nausea.  Genitourinary: Negative for dysuria and frequency.  Musculoskeletal: Negative for falls.  Skin: Negative for rash.  Neurological: Negative for dizziness, loss of consciousness and headaches.  Endo/Heme/Allergies: Negative for environmental allergies.  Psychiatric/Behavioral: Negative for depression. The patient is not nervous/anxious.        Objective:    Physical Exam Vitals signs and nursing note reviewed.  Constitutional:      General: She is not in acute distress.    Appearance: Normal appearance. She is well-developed. She is not  ill-appearing.  HENT:     Head: Normocephalic and atraumatic.     Nose: Nose normal.  Eyes:     General:        Right eye: No discharge.        Left eye: No discharge.  Neck:     Musculoskeletal: Neck supple.  Pulmonary:     Effort: Pulmonary effort is normal.  Abdominal:     General: Bowel sounds are normal.     Palpations: Abdomen is soft.     Tenderness: There is no abdominal tenderness.  Skin:    General: Skin is dry.  Neurological:     Mental Status: She is alert and oriented to person, place, and time.  Psychiatric:        Mood and Affect: Mood normal.        Behavior: Behavior normal.     There were no vitals taken for this visit. Wt Readings from Last 3 Encounters:  10/05/18 199 lb 3.2 oz (90.4 kg)  07/17/18 205 lb 1.6 oz (93 kg)  06/30/18 207 lb 3.2 oz (94 kg)    Diabetic Foot Exam - Simple   No data filed     Lab Results  Component Value Date   WBC 8.3 10/05/2018   HGB 13.3 10/05/2018   HCT 39.7 10/05/2018   PLT 288.0 10/05/2018   GLUCOSE 96 10/05/2018   CHOL 223 (H) 10/05/2018   TRIG (H) 10/05/2018    402.0 Triglyceride is over 400; calculations on Lipids are invalid.   HDL 49.60 10/05/2018   LDLDIRECT 120.0 10/05/2018   ALT 24 10/05/2018   AST 21 10/05/2018   NA 138 10/05/2018   K 4.2 10/05/2018   CL 100 10/05/2018   CREATININE 0.73 10/05/2018   BUN 17 10/05/2018   CO2 29 10/05/2018   TSH 2.54 10/05/2018   HGBA1C 5.7 10/05/2018    Lab Results  Component Value Date   TSH 2.54 10/05/2018   Lab Results  Component Value Date   WBC 8.3 10/05/2018   HGB 13.3 10/05/2018   HCT 39.7 10/05/2018   MCV 90.6 10/05/2018   PLT 288.0 10/05/2018   Lab Results  Component Value Date   NA 138 10/05/2018   K 4.2 10/05/2018   CHLORIDE 103 07/02/2017   CO2 29 10/05/2018   GLUCOSE 96 10/05/2018   BUN 17 10/05/2018   CREATININE 0.73 10/05/2018   BILITOT 0.3 10/05/2018   ALKPHOS 92 10/05/2018   AST 21 10/05/2018   ALT 24 10/05/2018   PROT 7.8  10/05/2018   ALBUMIN 4.7 10/05/2018   CALCIUM 9.8 10/05/2018   ANIONGAP 9 06/29/2018   EGFR >60 07/02/2017   GFR 82.14 10/05/2018   Lab Results  Component Value Date   CHOL 223 (H) 10/05/2018   Lab Results  Component Value Date   HDL 49.60 10/05/2018   No results found for: [facility] Lab Results  Component Value Date   TRIG (H) 10/05/2018    402.0 Triglyceride is over 400; calculations on Lipids are invalid.   Lab Results  Component Value Date   CHOLHDL 5 10/05/2018   Lab Results  Component Value Date   HGBA1C 5.7 10/05/2018       Assessment & Plan:   Problem List Items Addressed This Visit    Bronchitis    She developed a dry cough 2-3 days ago. She also notes she lost her sense of smell. No obvious fevers or chills. Notes mild nasal congestion, she has myalgais but these are at her baseline. No known positives for Covid. Encouraged to hydrate more and take Mucinex bid. Given Tessalon Perles  To use prn during the day and use Hydromet qhs. Has Albuterol inhaler but is given a spacer to use prn. If she worsens she is given a prescription for Doxycycline to use if symptoms worsen and she will seek care if SOB and High fever develop. Follow up in one week or as needed. Quarantine to home for 14 days from onset.          I am having  Daphnee Caldera start on benzonatate, HYDROcodone-homatropine, doxycycline, and Spacer/Aero-Holding Chambers. I am also having her maintain her multivitamin with minerals, albuterol, ALPRAZolam, and blood glucose meter kit and supplies.  Meds ordered this encounter  Medications  . benzonatate (TESSALON) 200 MG capsule    Sig: Take 1 capsule (200 mg total) by mouth 3 (three) times daily as needed for cough.    Dispense:  40 capsule    Refill:  1  . HYDROcodone-homatropine (HYCODAN) 5-1.5 MG/5ML syrup    Sig: Take 5 mLs by mouth every 8 (eight) hours as needed for cough.    Dispense:  120 mL    Refill:  0  . doxycycline (VIBRA-TABS) 100 MG  tablet    Sig: Take 1 tablet (100 mg total) by mouth 2 (two) times daily.    Dispense:  20 tablet    Refill:  0  . Spacer/Aero-Holding Chambers DEVI    Sig: 1 Device by Does not apply route 4 (four) times daily as needed.    Dispense:  1 each    Refill:  1      I discussed the assessment and treatment plan with the patient. The patient was provided an opportunity to ask questions and all were answered. The patient agreed with the plan and demonstrated an understanding of the instructions.   The patient was advised to call back or seek an in-person evaluation if the symptoms worsen or if the condition fails to improve as anticipated.  I provided 15 minutes of non-face-to-face time during this encounter.   Penni Homans, MD

## 2018-11-10 NOTE — Progress Notes (Signed)
E-Visit for Corona Virus Screening  Based on your current symptoms, you may very well have the virus, however your symptoms are mild. Currently, not all patients are being tested. If the symptoms are mild and there is not a known exposure, performing the test is not indicated.  Coronavirus disease 2019 (COVID-19)is a respiratory illness that can spread from person to person. The virus that causes COVID-19 is a new virus that was first identified in the country of [place] but is now found in multiple other countries and has spread to the [place].  Symptoms associated with the virus are mild to severe fever, cough, and shortness of breath. There is currently no vaccine to protect against COVID-19, and there is no specific antiviral treatment for the virus.   To be considered HIGH RISK for Coronavirus (COVID-19), you have to meet the following criteria:  . Traveled to [place], [place], [place], [place] or [place]; or in the [place] to [place], [place], [place], or [place]; and have fever, cough, and shortness of breath within the last 2 weeks of travel OR  . Been in close contact with a person diagnosed with COVID-19 within the last 2 weeks and have fever, cough, and shortness of breath  . IF YOU DO NOT MEET THESE CRITERIA, YOU ARE CONSIDERED LOW RISK FOR COVID-19.   It is vitally important that if you feel that you have an infection such as this virus or any other virus that you stay home and away from places where you may spread it to others.  You should self-quarantine for 14 days if you have symptoms that could potentially be coronavirus and avoid contact with people age 18 and older.    You may take acetaminophen (Tylenol) as needed for fever.   Reduce your risk of any infection by using the same precautions used for avoiding the common cold or flu: Wash your hands often with soap and warm water for at least 20 seconds.  If soap and water are not readily available, use an  alcohol-based hand sanitizer with at least 60% alcohol.  If coughing or sneezing, cover your mouth and nose by coughing or sneezing into the elbow areas of your shirt or coat, into a tissue or into your sleeve (not your hands). Avoid shaking hands with others and consider head nods or verbal greetings only.  Avoid touching your eyes,nose, or mouth with unwashed hands. Avoid close contact with people who are sick. Avoid places or events with large numbers of people in one location, like concerts or sporting events. Carefully consider travel plans you have or are making. If you are planning any travel outside or inside the [place], visit the CDC'sTravelers' Health webpagefor the latest health notices. If you have some symptoms but not all symptoms, continue to monitor at home and seek medical attention if your symptoms worsen. If you are having a medical emergency, call 911.  HOME CARE Only take medications as instructed by your medical team. Drink plenty of fluids and get plenty of rest. A steam or ultrasonic humidifier can help if you have congestion.   GET HELP RIGHT AWAY IF: You develop worsening fever. You become short of breath You cough up blood. Your symptoms become more severe MAKE SURE YOU  Understand these instructions. Will watch your condition. Will get help right away if you are not doing well or get worse.  Your e-visit answers were reviewed by a board certified advanced clinical practitioner to complete your personal  care plan.  Depending on the condition, your plan could have included both over the counter or prescription medications.  If there is a problem please reply once you have received a response from your provider. Your safety is important to [place].  If you have drug allergies check your prescription carefully.    You can use MyChart to ask questions about today's visit, request a non-urgent call back, or ask for a work or school excuse for 24 hours related to this  e-Visit. If it has been greater than 24 hours you will need to follow up with your provider, or enter a new e-Visit to address those concerns. You will get an e-mail in the next two days asking about your experience.  I hope that your e-visit has been valuable and will speed your recovery. Thank you for using e-visits.    ===View-only below this line===   ----- Message -----    From: Miranda Orozco    Sent: 11/10/2018  1:12 PM EDT      To: E-Visit Mailing List Subject: E-Visit Submission: CoronaVirus (COVID-19) Screening  E-Visit Submission: CoronaVirus ([id number]) Screening --------------------------------  Question: Do you have any of the following?  Answer:   Cough  Question: Do you have any of the following additional symptoms?  Answer:   Stuffy nose  Question: Have you had a fever? Answer:   No  Question: Have others in your home or workplace had similar symptoms? Answer:   No  Question: When did your symptoms start? Answer:   3 days  Question: Have you recently visited any of the following countries? Answer:   None of these  Question: If you have traveled anywhere in the last  2 months please document where you have visited: Answer:   [place], [place], [place]  Question: Have you recently been around others from these countries or visited these countries who have had coughing or fever? Answer:   No  Question: Have you recently been around anyone who has been diagnosed with Corona virus? Answer:   No  Question: Have you been taking any medications? Answer:   Yes  Question: If taking medications for these symptoms, please list the names and whether they are helping or not Answer:   Cough syrup, tylenol  Question: Are you treated for any of the following conditions: Asthma, COPD, Diabetes, Renal Failure (on Dialysis), AIDS, any Neuromuscular disease that effects the clearing of secretions, Heart Failure, or Heart Disease? Answer:   No  Question: Please enter  a phone number where you can be reached if we have additional questions about your symptoms Answer:   [phone]  Question: Please list your medication allergies that you may have ? (If 'none' , please list as 'none') Answer:   N/a  Question: Please list any additional comments  Answer:   I have lost my sense of smell within the last 24 hours  E-Visit for [facility] Virus Screening  Based on your current symptoms, you may very well have the virus, however your symptoms are mild. Currently, not all patients are being tested. If the symptoms are mild and there is not a known exposure, performing the test is not indicated.  Coronavirus disease 2019 (COVID-19)is a respiratory illness that can spread from person to person. The virus that causes COVID-19 is a new virus that was first identified in the country of [place] but is now found in multiple other countries and has spread to the [place].  Symptoms associated with the virus are  mild to severe fever, cough, and shortness of breath. There is currently no vaccine to protect against COVID-19, and there is no specific antiviral treatment for the virus.   To be considered HIGH RISK for Coronavirus (COVID-19), you have to meet the following criteria:  . Traveled to [place], [place], [place], [place] or [place]; or in the [place] to [place], [place], [place], or [place]; and have fever, cough, and shortness of breath within the last 2 weeks of travel OR  . Been in close contact with a person diagnosed with COVID-19 within the last 2 weeks and have fever, cough, and shortness of breath  . IF YOU DO NOT MEET THESE CRITERIA, YOU ARE CONSIDERED LOW RISK FOR COVID-19.   It is vitally important that if you feel that you have an infection such as this virus or any other virus that you stay home and away from places where you may spread it to others.  You should self-quarantine for 14 days if you have symptoms that could potentially be coronavirus  and avoid contact with people age 61 and older.    You may take acetaminophen (Tylenol) as needed for fever.   Reduce your risk of any infection by using the same precautions used for avoiding the common cold or flu: Wash your hands often with soap and warm water for at least 20 seconds.  If soap and water are not readily available, use an alcohol-based hand sanitizer with at least 60% alcohol.  If coughing or sneezing, cover your mouth and nose by coughing or sneezing into the elbow areas of your shirt or coat, into a tissue or into your sleeve (not your hands). Avoid shaking hands with others and consider head nods or verbal greetings only.  Avoid touching your eyes,nose, or mouth with unwashed hands. Avoid close contact with people who are sick. Avoid places or events with large numbers of people in one location, like concerts or sporting events. Carefully consider travel plans you have or are making. If you are planning any travel outside or inside the [place], visit the CDC'sTravelers' Health webpagefor the latest health notices. If you have some symptoms but not all symptoms, continue to monitor at home and seek medical attention if your symptoms worsen. If you are having a medical emergency, call 911.  HOME CARE Only take medications as instructed by your medical team. Drink plenty of fluids and get plenty of rest. A steam or ultrasonic humidifier can help if you have congestion.   GET HELP RIGHT AWAY IF: You develop worsening fever. You become short of breath You cough up blood. Your symptoms become more severe MAKE SURE YOU  Understand these instructions. Will watch your condition. Will get help right away if you are not doing well or get worse.  Your e-visit answers were reviewed by a board certified advanced clinical practitioner to complete your personal care plan.  Depending on the condition, your plan could have included both over the counter or prescription  medications.  If there is a problem please reply once you have received a response from your provider. Your safety is important to [place].  If you have drug allergies check your prescription carefully.    You can use MyChart to ask questions about today's visit, request a non-urgent call back, or ask for a work or school excuse for 24 hours related to this e-Visit. If it has been greater than 24 hours you will need to follow up with your provider,  or enter a new e-Visit to address those concerns. You will get an e-mail in the next two days asking about your experience.  I hope that your e-visit has been valuable and will speed your recovery. Thank you for using e-visits.    ===View-only below this line===   ----- Message -----    From: Miranda Orozco    Sent: 11/10/2018  1:12 PM EDT      To: E-Visit Mailing List Subject: E-Visit Submission: CoronaVirus (COVID-19) Screening  E-Visit Submission: CoronaVirus ([id number]) Screening --------------------------------  Question: Do you have any of the following?  Answer:   Cough  Question: Do you have any of the following additional symptoms?  Answer:   Stuffy nose  Question: Have you had a fever? Answer:   No  Question: Have others in your home or workplace had similar symptoms? Answer:   No  Question: When did your symptoms start? Answer:   3 days  Question: Have you recently visited any of the following countries? Answer:   None of these  Question: If you have traveled anywhere in the last  2 months please document where you have visited: Answer:   [place], [place], [place]  Question: Have you recently been around others from these countries or visited these countries who have had coughing or fever? Answer:   No  Question: Have you recently been around anyone who has been diagnosed with Corona virus? Answer:   No  Question: Have you been taking any medications? Answer:   Yes  Question: If taking medications for these  symptoms, please list the names and whether they are helping or not Answer:   Cough syrup, tylenol  Question: Are you treated for any of the following conditions: Asthma, COPD, Diabetes, Renal Failure (on Dialysis), AIDS, any Neuromuscular disease that effects the clearing of secretions, Heart Failure, or Heart Disease? Answer:   No  Question: Please enter a phone number where you can be reached if we have additional questions about your symptoms Answer:   [phone]  Question: Please list your medication allergies that you may have ? (If 'none' , please list as 'none') Answer:   N/a  Question: Please list any additional comments  Answer:   I have lost my sense of smell within the last 24 hours  A total of 5-10 minutes was spent evaluating this patients questionnaire and formulating a plan of care.

## 2018-11-10 NOTE — Assessment & Plan Note (Addendum)
She developed a dry cough 2-3 days ago. She also notes she lost her sense of smell. No obvious fevers or chills. Notes mild nasal congestion, she has myalgais but these are at her baseline. No known positives for Covid. Encouraged to hydrate more and take Mucinex bid. Given Tessalon Perles  To use prn during the day and use Hydromet qhs. Has Albuterol inhaler but is given a spacer to use prn. If she worsens she is given a prescription for Doxycycline to use if symptoms worsen and she will seek care if SOB and High fever develop. Follow up in one week or as needed. Quarantine to home for 14 days from onset.

## 2018-11-10 NOTE — Telephone Encounter (Signed)
Pt called stating that she did an E visit today through my chart and was told she had a probability of COVID-19.  She states that she has traveled from [place] back to [place] about 2 weeks ago.  She says 3 days ago she started with a tickle in her throat that has become a dry cough.  She also has a loss of smell that she has tested with many household products.  She has no fever.  She has some heaviness to her chest when taking a deep breath.  She is really looking for advice on how to care for her adult sone who has medical challenges. She states that it will be impossible to stay away from him. Per office protocol pt call was routed to office for web visit. Pt read care advice.  She verbalized understanding of all instructions.  Reason for Disposition . [1] Continuous (nonstop) coughing interferes with work or school AND [2] no improvement using cough treatment per protocol  Answer Assessment - Initial Assessment Questions 1. COVID-19 DIAGNOSIS: "Who made your Coronavirus (COVID-19) diagnosis?" "Was it confirmed by a positive lab test?" If not diagnosed by a HCP, ask "Are there lots of cases (community spread) where you live?" (See public health department website, if unsure)   * MAJOR community spread: high number of cases; numbers of cases are increasing; many people hospitalized.   * MINOR community spread: low number of cases; not increasing; few or no people hospitalized     Major traveled to [place] to bring daughter from school 2. ONSET: "When did the COVID-19 symptoms start?"      3 days ago 3. WORST SYMPTOM: "What is your worst symptom?" (e.g., cough, fever, shortness of breath, muscle aches)    Joint pain, Cough. A little tight with deep breath 4. COUGH: "How bad is the cough?"       Cough voice is hoarse 5. FEVER: "Do you have a fever?" If so, ask: "What is your temperature, how was it measured, and when did it start?"     No 6. RESPIRATORY STATUS: "Describe your breathing?" (e.g.,  shortness of breath, wheezing, unable to speak)      sob 7. BETTER-SAME-WORSE: "Are you getting better, staying the same or getting worse compared to yesterday?"  If getting worse, ask, "In what way?"    worse 8. HIGH RISK DISEASE: "Do you have any chronic medical problems?" (e.g., asthma, heart or lung disease, weak immune system, etc.)     Weakened immune symptom from Ca 9. PREGNANCY: "Is there any chance you are pregnant?" "When was your last menstrual period?"    No 10. OTHER SYMPTOMS: "Do you have any other symptoms?"  (e.g., runny nose, headache, sore throat, loss of smell)      Runny nose scratchy throat lost smell  Protocols used: CORONAVIRUS (COVID-19) DIAGNOSED OR SUSPECTED-A-AH

## 2018-11-11 ENCOUNTER — Ambulatory Visit: Payer: 59 | Admitting: Family Medicine

## 2018-11-11 DIAGNOSIS — B349 Viral infection, unspecified: Secondary | ICD-10-CM | POA: Diagnosis not present

## 2018-11-11 DIAGNOSIS — R509 Fever, unspecified: Secondary | ICD-10-CM | POA: Diagnosis not present

## 2019-01-07 ENCOUNTER — Ambulatory Visit (INDEPENDENT_AMBULATORY_CARE_PROVIDER_SITE_OTHER): Payer: 59 | Admitting: Family Medicine

## 2019-01-07 ENCOUNTER — Other Ambulatory Visit: Payer: Self-pay

## 2019-01-07 VITALS — BP 132/84 | HR 88 | Temp 98.4°F | Resp 18 | Wt 200.0 lb

## 2019-01-07 DIAGNOSIS — R05 Cough: Secondary | ICD-10-CM

## 2019-01-07 DIAGNOSIS — R002 Palpitations: Secondary | ICD-10-CM | POA: Diagnosis not present

## 2019-01-07 DIAGNOSIS — F419 Anxiety disorder, unspecified: Secondary | ICD-10-CM | POA: Diagnosis not present

## 2019-01-07 DIAGNOSIS — J40 Bronchitis, not specified as acute or chronic: Secondary | ICD-10-CM

## 2019-01-07 DIAGNOSIS — R739 Hyperglycemia, unspecified: Secondary | ICD-10-CM

## 2019-01-07 DIAGNOSIS — R059 Cough, unspecified: Secondary | ICD-10-CM

## 2019-01-07 DIAGNOSIS — D649 Anemia, unspecified: Secondary | ICD-10-CM

## 2019-01-07 DIAGNOSIS — E782 Mixed hyperlipidemia: Secondary | ICD-10-CM | POA: Diagnosis not present

## 2019-01-07 LAB — SAR COV2 SEROLOGY (COVID19)AB(IGG),IA: SARS CoV2 AB IGG: NEGATIVE

## 2019-01-07 NOTE — Assessment & Plan Note (Addendum)
hgba1c acceptable, minimize simple carbs. Increase exercise as tolerated.  

## 2019-01-07 NOTE — Patient Instructions (Signed)
Palpitations  Palpitations are feelings that your heartbeat is irregular or is faster than normal. It may feel like your heart is fluttering or skipping a beat. Palpitations are usually not a serious problem. They may be caused by many things, including smoking, caffeine, alcohol, stress, and certain medicines or drugs. Most causes of palpitations are not serious. However, some palpitations can be a sign of a serious problem. You may need further tests to rule out serious medical problems.  Follow these instructions at home:         Pay attention to any changes in your condition. Take these actions to help manage your symptoms:  Eating and drinking  Avoid foods and drinks that may cause palpitations. These may include:  Caffeinated coffee, tea, soft drinks, diet pills, and energy drinks.  Chocolate.  Alcohol.  Lifestyle  Take steps to reduce your stress and anxiety. Things that can help you relax include:  Yoga.  Mind-body activities, such as deep breathing, meditation, or using words and images to create positive thoughts (guided imagery).  Physical activity, such as swimming, jogging, or walking. Tell your health care provider if your palpitations increase with activity. If you have chest pain or shortness of breath with activity, do not continue the activity until you are seen by your health care provider.  Biofeedback. This is a method that helps you learn to use your mind to control things in your body, such as your heartbeat.  Do not use drugs, including cocaine or ecstasy. Do not use marijuana.  Get plenty of rest and sleep. Keep a regular bed time.  General instructions  Take over-the-counter and prescription medicines only as told by your health care provider.  Do not use any products that contain nicotine or tobacco, such as cigarettes and e-cigarettes. If you need help quitting, ask your health care provider.  Keep all follow-up visits as told by your health care provider. This is important. These may  include visits for further testing if palpitations do not go away or get worse.  Contact a health care provider if you:  Continue to have a fast or irregular heartbeat after 24 hours.  Notice that your palpitations occur more often.  Get help right away if you:  Have chest pain or shortness of breath.  Have a severe headache.  Feel dizzy or you faint.  Summary  Palpitations are feelings that your heartbeat is irregular or is faster than normal. It may feel like your heart is fluttering or skipping a beat.  Palpitations may be caused by many things, including smoking, caffeine, alcohol, stress, certain medicines, and drugs.  Although most causes of palpitations are not serious, some causes can be a sign of a serious medical problem.  Get help right away if you faint or have chest pain, shortness of breath, a severe headache, or dizziness.  This information is not intended to replace advice given to you by your health care provider. Make sure you discuss any questions you have with your health care provider.  Document Released: 07/19/2000 Document Revised: 09/03/2017 Document Reviewed: 09/03/2017  Elsevier Interactive Patient Education  2019 [employer]

## 2019-01-07 NOTE — Assessment & Plan Note (Signed)
Encouraged heart healthy diet, increase exercise, avoid trans fats, consider a krill oil cap daily 

## 2019-01-07 NOTE — Assessment & Plan Note (Signed)
Well-controlled on current meds 

## 2019-01-07 NOTE — Assessment & Plan Note (Signed)
CBC was normal on last check will continue to monitor

## 2019-01-08 LAB — COMPREHENSIVE METABOLIC PANEL
ALT: 19 U/L (ref 0–35)
AST: 20 U/L (ref 0–37)
Albumin: 4.5 g/dL (ref 3.5–5.2)
Alkaline Phosphatase: 105 U/L (ref 39–117)
BUN: 15 mg/dL (ref 6–23)
CO2: 31 mEq/L (ref 19–32)
Calcium: 9.4 mg/dL (ref 8.4–10.5)
Chloride: 101 mEq/L (ref 96–112)
Creatinine, Ser: 0.79 mg/dL (ref 0.40–1.20)
GFR: 74.91 mL/min (ref >60.00)
Glucose, Bld: 109 mg/dL — ABNORMAL HIGH (ref 70–99)
Potassium: 3.8 mEq/L (ref 3.5–5.1)
Sodium: 139 mEq/L (ref 135–145)
Total Bilirubin: 0.3 mg/dL (ref 0.2–1.2)
Total Protein: 7.6 g/dL (ref 6.0–8.3)

## 2019-01-08 LAB — HEMOGLOBIN A1C: Hgb A1c MFr Bld: 6.2 % (ref 4.6–6.5)

## 2019-01-08 LAB — CBC
HCT: 38.7 % (ref 36.0–46.0)
Hemoglobin: 13.2 g/dL (ref 12.0–15.0)
MCHC: 34.2 g/dL (ref 30.0–36.0)
MCV: 89.7 fl (ref 78.0–100.0)
Platelets: 263 10*3/uL (ref 150.0–400.0)
RBC: 4.31 Mil/uL (ref 3.87–5.11)
RDW: 13.8 % (ref 11.5–15.5)
WBC: 6.8 10*3/uL (ref 4.0–10.5)

## 2019-01-08 LAB — LIPID PANEL
Cholesterol: 220 mg/dL — ABNORMAL HIGH (ref 0–200)
HDL: 48 mg/dL (ref >39.00)
NonHDL: 171.58
Total CHOL/HDL Ratio: 5
Triglycerides: 235 mg/dL — ABNORMAL HIGH (ref 0.0–149.0)
VLDL: 47 mg/dL — ABNORMAL HIGH (ref 0.0–40.0)

## 2019-01-08 LAB — TSH: TSH: 2 u[IU]/mL (ref 0.35–4.50)

## 2019-01-08 LAB — LDL CHOLESTEROL, DIRECT: Direct LDL: 108 mg/dL

## 2019-01-08 NOTE — Assessment & Plan Note (Addendum)
Is feeling much better. No persistent concerning symptoms COVID antibody testing is negative.

## 2019-01-10 NOTE — Progress Notes (Signed)
Subjective:    Patient ID: Miranda Orozco, female    DOB: 02/11/1961, 58 y.o.   MRN: [id number]  No chief complaint on file.   HPI Patient is in today for follow up on bronchitis and chronic medial concerns including hyperglycemia, hyperlipidemia, and more. She is denying any new febrile illness. Her respiratory symptoms have improved but she still wonders if she had COVID. She is under a great deal of stress at home but is managing fairly well. She has her elderly parents living with her now and the group living situation for their son with special needs has fallen through so he is still home as well. Denies CP/palp/SOB/HA/congestion/fevers/GI or GU c/o. Taking meds as prescribed  Past Medical History:  Diagnosis Date  . Arthritis, rheumatic, acute or subacute 10/02/2009   RA  . BREAST CANCER, HX OF 10/02/2009  . Disorder of bone and cartilage 10/02/2009   Qualifier: Diagnosis of  By: [name] MD, [name]   . Esophageal reflux 10/02/2009  . Hives 09/18/2014  . MALIGNANT NEOPLASM OF BREAST UNSPECIFIED SITE 10/02/2009   RIGHT CHMOE AND RADIATION DONE  . Obesity   . OSTEOPENIA 10/02/2009    Past Surgical History:  Procedure Laterality Date  . BREAST LUMPECTOMY  03/20/10   for margin  . BREAST LUMPECTOMY  03/26/2010   for margin  . CERVICAL CONIZATION W/BX Bilateral 02/06/2018   Procedure: CONIZATION CERVIX WITH BIOPSY;  Surgeon: Eldred Manges, MD;  Location: [facility];  Service: Gynecology;  Laterality: Bilateral;  . [place], 2001  . COLOMSCOPY  2018  . DILATATION & CURRETTAGE/HYSTEROSCOPY WITH RESECTOCOPE N/A 02/19/2013   Procedure: DILATATION & CURETTAGE/HYSTEROSCOPY WITH RESECTION OF POLYP;  Surgeon: Eldred Manges, MD;  Location: [facility];  Service: Gynecology;  Laterality: N/A;  . MASTECTOMY PARTIAL / LUMPECTOMY W/ AXILLARY LYMPHADENECTOMY  03/12/2010   Right w SLN Dr Margot Chimes  . SIMPLE MASTECTOMY  03/31/2011   For margin    Family History   Problem Relation Age of Onset  . Hypertension Mother   . Hyperlipidemia Mother   . Hypertension Father   . Diabetes Father   . Cancer Father        lung cancer, smoker  . Arthritis Other   . Diabetes Other   . Hypertension Other   . Colon cancer Neg Hx   . Stomach cancer Neg Hx     Social History   Socioeconomic History  . Marital status: Married    Spouse name: Not on file  . Number of children: Not on file  . Years of education: Not on file  . Highest education level: Not on file  Occupational History  . Not on file  Social Needs  . Financial resource strain: Not on file  . Food insecurity:    Worry: Not on file    Inability: Not on file  . Transportation needs:    Medical: Not on file    Non-medical: Not on file  Tobacco Use  . Smoking status: Former Smoker    Packs/day: 0.50    Years: 1.00    Pack years: 0.50    Types: Cigarettes    Last attempt to quit: 10/17/1981    Years since quitting: 37.2  . Smokeless tobacco: Never Used  Substance and Sexual Activity  . Alcohol use: Yes    Alcohol/week: 2.0 - 3.0 standard drinks    Types: 2 - 3 Glasses of wine per week  .  Drug use: No  . Sexual activity: Yes    Birth control/protection: None    Comment: lives with husband and children, no dietary restriction  Lifestyle  . Physical activity:    Days per week: Not on file    Minutes per session: Not on file  . Stress: Not on file  Relationships  . Social connections:    Talks on phone: Not on file    Gets together: Not on file    Attends religious service: Not on file    Active member of club or organization: Not on file    Attends meetings of clubs or organizations: Not on file    Relationship status: Not on file  . Intimate partner violence:    Fear of current or ex partner: Not on file    Emotionally abused: Not on file    Physically abused: Not on file    Forced sexual activity: Not on file  Other Topics Concern  . Not on file  Social History Narrative   . Not on file    Outpatient Medications Prior to Visit  Medication Sig Dispense Refill  . albuterol (PROVENTIL HFA;VENTOLIN HFA) 108 (90 Base) MCG/ACT inhaler Inhale 2 puffs into the lungs every 6 (six) hours as needed for wheezing or shortness of breath. 1 Inhaler 0  . ALPRAZolam (XANAX) 0.25 MG tablet Take 1 tablet (0.25 mg total) by mouth 2 (two) times daily as needed for anxiety or sleep. 20 tablet 1  . benzonatate (TESSALON) 200 MG capsule Take 1 capsule (200 mg total) by mouth 3 (three) times daily as needed for cough. 40 capsule 1  . blood glucose meter kit and supplies Dispense based on patient and insurance preference. Use up to four times daily as directed. Dz:E11.9 Check BS TID prn 1 each 0  . doxycycline (VIBRA-TABS) 100 MG tablet Take 1 tablet (100 mg total) by mouth 2 (two) times daily. 20 tablet 0  . HYDROcodone-homatropine (HYCODAN) 5-1.5 MG/5ML syrup Take 5 mLs by mouth every 8 (eight) hours as needed for cough. 120 mL 0  . Multiple Vitamins-Minerals (MULTIVITAMIN WITH MINERALS) tablet Take 1 tablet by mouth daily.    Marland Kitchen Spacer/Aero-Holding Chambers DEVI 1 Device by Does not apply route 4 (four) times daily as needed. 1 each 1   No facility-administered medications prior to visit.     No Known Allergies  Review of Systems  Constitutional: Positive for malaise/fatigue. Negative for fever.  HENT: Negative for congestion.   Eyes: Negative for blurred vision.  Respiratory: Negative for shortness of breath.   Cardiovascular: Negative for chest pain, palpitations and leg swelling.  Gastrointestinal: Negative for abdominal pain, blood in stool and nausea.  Genitourinary: Negative for dysuria and frequency.  Musculoskeletal: Negative for falls.  Skin: Negative for rash.  Neurological: Negative for dizziness, loss of consciousness and headaches.  Endo/Heme/Allergies: Negative for environmental allergies.  Psychiatric/Behavioral: Positive for depression. The patient is  nervous/anxious.        Objective:    Physical Exam Vitals signs and nursing note reviewed.  Constitutional:      General: She is not in acute distress.    Appearance: She is well-developed.  HENT:     Head: Normocephalic and atraumatic.     Nose: Nose normal.  Eyes:     General:        Right eye: No discharge.        Left eye: No discharge.  Neck:     Musculoskeletal: Normal range of  motion and neck supple.  Cardiovascular:     Rate and Rhythm: Normal rate and regular rhythm.     Heart sounds: No murmur.  Pulmonary:     Effort: Pulmonary effort is normal.     Breath sounds: Normal breath sounds.  Abdominal:     General: Bowel sounds are normal.     Palpations: Abdomen is soft.     Tenderness: There is no abdominal tenderness.  Skin:    General: Skin is warm and dry.  Neurological:     Mental Status: She is alert and oriented to person, place, and time.     BP 132/84   Pulse 88   Temp 98.4 F (36.9 C) (Oral)   Resp 18   Wt 200 lb (90.7 kg)   SpO2 98%   BMI 32.28 kg/m  Wt Readings from Last 3 Encounters:  01/07/19 200 lb (90.7 kg)  10/05/18 199 lb 3.2 oz (90.4 kg)  07/17/18 205 lb 1.6 oz (93 kg)    Diabetic Foot Exam - Simple   No data filed     Lab Results  Component Value Date   WBC 6.8 01/07/2019   HGB 13.2 01/07/2019   HCT 38.7 01/07/2019   PLT 263.0 01/07/2019   GLUCOSE 109 (H) 01/07/2019   CHOL 220 (H) 01/07/2019   TRIG 235.0 (H) 01/07/2019   HDL 48.00 01/07/2019   LDLDIRECT 108.0 01/07/2019   ALT 19 01/07/2019   AST 20 01/07/2019   NA 139 01/07/2019   K 3.8 01/07/2019   CL 101 01/07/2019   CREATININE 0.79 01/07/2019   BUN 15 01/07/2019   CO2 31 01/07/2019   TSH 2.00 01/07/2019   HGBA1C 6.2 01/07/2019    Lab Results  Component Value Date   TSH 2.00 01/07/2019   Lab Results  Component Value Date   WBC 6.8 01/07/2019   HGB 13.2 01/07/2019   HCT 38.7 01/07/2019   MCV 89.7 01/07/2019   PLT 263.0 01/07/2019   Lab Results   Component Value Date   NA 139 01/07/2019   K 3.8 01/07/2019   CHLORIDE 103 07/02/2017   CO2 31 01/07/2019   GLUCOSE 109 (H) 01/07/2019   BUN 15 01/07/2019   CREATININE 0.79 01/07/2019   BILITOT 0.3 01/07/2019   ALKPHOS 105 01/07/2019   AST 20 01/07/2019   ALT 19 01/07/2019   PROT 7.6 01/07/2019   ALBUMIN 4.5 01/07/2019   CALCIUM 9.4 01/07/2019   ANIONGAP 9 06/29/2018   EGFR >60 07/02/2017   GFR 74.91 01/07/2019   Lab Results  Component Value Date   CHOL 220 (H) 01/07/2019   Lab Results  Component Value Date   HDL 48.00 01/07/2019   No results found for: [facility] Lab Results  Component Value Date   TRIG 235.0 (H) 01/07/2019   Lab Results  Component Value Date   CHOLHDL 5 01/07/2019   Lab Results  Component Value Date   HGBA1C 6.2 01/07/2019       Assessment & Plan:   Problem List Items Addressed This Visit    Anemia    CBC was normal on last check will continue to monitor      Relevant Orders   CBC (Completed)   Hyperlipidemia, mixed    Encouraged heart healthy diet, increase exercise, avoid trans fats, consider a krill oil cap daily      Relevant Orders   Lipid panel (Completed)   Palpitation - Primary    Well controlled on current meds.  Relevant Orders   Comprehensive metabolic panel (Completed)   TSH (Completed)   CBC (Completed)   Anxiety    Under a tremendous amount of stress with her special needs son still home for now. She and her husband recovering from cancer and now her elderly parents living with her now. Overall she is managing the stress but will let us know if it gets over whelming.       Bronchitis    Is feeling much better. No persistent concerning symptoms COVID antibody testing is negative.       Hyperglycemia    hgba1c acceptable, minimize simple carbs. Increase exercise as tolerated.      Relevant Orders   Hemoglobin A1c (Completed)   Comprehensive metabolic panel (Completed)    Other Visit Diagnoses    Cough        Relevant Orders   SAR CoV2 Serology (COVID 19)AB(IGG)IA (Completed)      I am having Pluma Mairena maintain her multivitamin with minerals, albuterol, ALPRAZolam, blood glucose meter kit and supplies, benzonatate, HYDROcodone-homatropine, doxycycline, and Spacer/Aero-Holding Chambers.  No orders of the defined types were placed in this encounter.    Penni Homans, MD

## 2019-01-10 NOTE — Assessment & Plan Note (Signed)
Under a tremendous amount of stress with her special needs son still home for now. She and her husband recovering from cancer and now her elderly parents living with her now. Overall she is managing the stress but will let us know if it gets over whelming.

## 2019-01-11 MED FILL — SHINGRIX 50 MCG SUS: 50 | 1 days supply | Qty: 1 | Fill #1

## 2019-04-13 ENCOUNTER — Ambulatory Visit: Payer: 59 | Admitting: Family Medicine

## 2019-04-13 ENCOUNTER — Ambulatory Visit (INDEPENDENT_AMBULATORY_CARE_PROVIDER_SITE_OTHER): Payer: 59 | Admitting: Family Medicine

## 2019-04-13 ENCOUNTER — Other Ambulatory Visit: Payer: Self-pay

## 2019-04-13 DIAGNOSIS — E782 Mixed hyperlipidemia: Secondary | ICD-10-CM | POA: Diagnosis not present

## 2019-04-13 DIAGNOSIS — I10 Essential (primary) hypertension: Secondary | ICD-10-CM

## 2019-04-13 DIAGNOSIS — R002 Palpitations: Secondary | ICD-10-CM | POA: Diagnosis not present

## 2019-04-13 DIAGNOSIS — E669 Obesity, unspecified: Secondary | ICD-10-CM

## 2019-04-13 DIAGNOSIS — R739 Hyperglycemia, unspecified: Secondary | ICD-10-CM | POA: Diagnosis not present

## 2019-04-13 DIAGNOSIS — F419 Anxiety disorder, unspecified: Secondary | ICD-10-CM | POA: Diagnosis not present

## 2019-04-13 MED ORDER — CARVEDILOL 3.125 MG PO TABS: 3.1250 mg | ORAL_TABLET | Freq: Two times a day (BID) | ORAL | 3 refills | Status: DC

## 2019-04-13 MED FILL — CARVEDILOL 3.125 MG TABLET: 3.125 | 30 days supply | Qty: 60 | Fill #0

## 2019-04-14 DIAGNOSIS — I10 Essential (primary) hypertension: Secondary | ICD-10-CM | POA: Insufficient documentation

## 2019-04-14 NOTE — Progress Notes (Signed)
Subjective:    Patient ID: Genowefa Morga, female    DOB: 1960-11-12, 58 y.o.   MRN: [id number]  No chief complaint on file.   HPI Patient is in today for follow up on chronic medical concerns including anxiety, high blood pressure, obesity and more. No recent febrile illness or hospitalizations. She is currently in [place] for her brother in [place]. Is exceedingly stressed caring for her 68 year old father, her 51 year old mother, her adult special needs son who requires complete care and now she is in [place] to attend the funeral of her brother in law who just passed away. Is using the Alprazolam very infrequently but does not require further meds at this time. She acknowledges she has had trouble taking the time away for herself to exercise and eat well.Denies CP/palp/SOB/HA/congestion/fevers/GI or GU c/o. Taking meds as prescribed  Past Medical History:  Diagnosis Date  . Arthritis, rheumatic, acute or subacute 10/02/2009   RA  . BREAST CANCER, HX OF 10/02/2009  . Disorder of bone and cartilage 10/02/2009   Qualifier: Diagnosis of  By: [name] MD, [name]   . Esophageal reflux 10/02/2009  . Hives 09/18/2014  . MALIGNANT NEOPLASM OF BREAST UNSPECIFIED SITE 10/02/2009   RIGHT CHMOE AND RADIATION DONE  . Obesity   . OSTEOPENIA 10/02/2009    Past Surgical History:  Procedure Laterality Date  . BREAST LUMPECTOMY  03/20/10   for margin  . BREAST LUMPECTOMY  03/26/2010   for margin  . CERVICAL CONIZATION W/BX Bilateral 02/06/2018   Procedure: CONIZATION CERVIX WITH BIOPSY;  Surgeon: Eldred Manges, MD;  Location: [facility];  Service: Gynecology;  Laterality: Bilateral;  . [place], 2001  . COLOMSCOPY  2018  . DILATATION & CURRETTAGE/HYSTEROSCOPY WITH RESECTOCOPE N/A 02/19/2013   Procedure: DILATATION & CURETTAGE/HYSTEROSCOPY WITH RESECTION OF POLYP;  Surgeon: Eldred Manges, MD;  Location: [facility];  Service: Gynecology;  Laterality: N/A;  .  MASTECTOMY PARTIAL / LUMPECTOMY W/ AXILLARY LYMPHADENECTOMY  03/12/2010   Right w SLN Dr Margot Chimes  . SIMPLE MASTECTOMY  03/31/2011   For margin    Family History  Problem Relation Age of Onset  . Hypertension Mother   . Hyperlipidemia Mother   . Hypertension Father   . Diabetes Father   . Cancer Father        lung cancer, smoker  . Arthritis Other   . Diabetes Other   . Hypertension Other   . Colon cancer Neg Hx   . Stomach cancer Neg Hx     Social History   Socioeconomic History  . Marital status: Married    Spouse name: Not on file  . Number of children: Not on file  . Years of education: Not on file  . Highest education level: Not on file  Occupational History  . Not on file  Social Needs  . Financial resource strain: Not on file  . Food insecurity    Worry: Not on file    Inability: Not on file  . Transportation needs    Medical: Not on file    Non-medical: Not on file  Tobacco Use  . Smoking status: Former Smoker    Packs/day: 0.50    Years: 1.00    Pack years: 0.50    Types: Cigarettes    Quit date: 10/17/1981    Years since quitting: 37.5  . Smokeless tobacco: Never Used  Substance and Sexual Activity  . Alcohol  use: Yes    Alcohol/week: 2.0 - 3.0 standard drinks    Types: 2 - 3 Glasses of wine per week  . Drug use: No  . Sexual activity: Yes    Birth control/protection: None    Comment: lives with husband and children, no dietary restriction  Lifestyle  . Physical activity    Days per week: Not on file    Minutes per session: Not on file  . Stress: Not on file  Relationships  . Social Herbalist on phone: Not on file    Gets together: Not on file    Attends religious service: Not on file    Active member of club or organization: Not on file    Attends meetings of clubs or organizations: Not on file    Relationship status: Not on file  . Intimate partner violence    Fear of current or ex partner: Not on file    Emotionally abused: Not  on file    Physically abused: Not on file    Forced sexual activity: Not on file  Other Topics Concern  . Not on file  Social History Narrative  . Not on file    Outpatient Medications Prior to Visit  Medication Sig Dispense Refill  . albuterol (PROVENTIL HFA;VENTOLIN HFA) 108 (90 Base) MCG/ACT inhaler Inhale 2 puffs into the lungs every 6 (six) hours as needed for wheezing or shortness of breath. 1 Inhaler 0  . ALPRAZolam (XANAX) 0.25 MG tablet Take 1 tablet (0.25 mg total) by mouth 2 (two) times daily as needed for anxiety or sleep. 20 tablet 1  . blood glucose meter kit and supplies Dispense based on patient and insurance preference. Use up to four times daily as directed. Dz:E11.9 Check BS TID prn 1 each 0  . Multiple Vitamins-Minerals (MULTIVITAMIN WITH MINERALS) tablet Take 1 tablet by mouth daily.    Marland Kitchen Spacer/Aero-Holding Chambers DEVI 1 Device by Does not apply route 4 (four) times daily as needed. 1 each 1  . benzonatate (TESSALON) 200 MG capsule Take 1 capsule (200 mg total) by mouth 3 (three) times daily as needed for cough. 40 capsule 1  . doxycycline (VIBRA-TABS) 100 MG tablet Take 1 tablet (100 mg total) by mouth 2 (two) times daily. 20 tablet 0  . HYDROcodone-homatropine (HYCODAN) 5-1.5 MG/5ML syrup Take 5 mLs by mouth every 8 (eight) hours as needed for cough. 120 mL 0   No facility-administered medications prior to visit.     No Known Allergies  Review of Systems  Constitutional: Negative for fever and malaise/fatigue.  HENT: Negative for congestion.   Eyes: Negative for blurred vision.  Respiratory: Negative for shortness of breath.   Cardiovascular: Negative for chest pain, palpitations and leg swelling.  Gastrointestinal: Negative for abdominal pain, blood in stool and nausea.  Genitourinary: Negative for dysuria and frequency.  Musculoskeletal: Negative for falls.  Skin: Negative for rash.  Neurological: Negative for dizziness, loss of consciousness and  headaches.  Endo/Heme/Allergies: Negative for environmental allergies.  Psychiatric/Behavioral: Negative for depression. The patient is not nervous/anxious.        Objective:    Physical Exam Vitals signs and nursing note reviewed.  Constitutional:      General: She is not in acute distress.    Appearance: She is well-developed.  HENT:     Head: Normocephalic and atraumatic.     Nose: Nose normal.  Eyes:     General:  Right eye: No discharge.        Left eye: No discharge.  Neck:     Musculoskeletal: Normal range of motion and neck supple.  Cardiovascular:     Rate and Rhythm: Normal rate and regular rhythm.     Heart sounds: No murmur.  Pulmonary:     Effort: Pulmonary effort is normal.     Breath sounds: Normal breath sounds.  Abdominal:     General: Bowel sounds are normal.     Palpations: Abdomen is soft.     Tenderness: There is no abdominal tenderness.  Skin:    General: Skin is warm and dry.  Neurological:     Mental Status: She is alert and oriented to person, place, and time.     BP (!) 160/90 (BP Location: Left Arm, Patient Position: Sitting, Cuff Size: Normal)   Wt 198 lb (89.8 kg)   BMI 31.96 kg/m  Wt Readings from Last 3 Encounters:  04/13/19 198 lb (89.8 kg)  01/07/19 200 lb (90.7 kg)  10/05/18 199 lb 3.2 oz (90.4 kg)    Diabetic Foot Exam - Simple   No data filed     Lab Results  Component Value Date   WBC 6.8 01/07/2019   HGB 13.2 01/07/2019   HCT 38.7 01/07/2019   PLT 263.0 01/07/2019   GLUCOSE 109 (H) 01/07/2019   CHOL 220 (H) 01/07/2019   TRIG 235.0 (H) 01/07/2019   HDL 48.00 01/07/2019   LDLDIRECT 108.0 01/07/2019   ALT 19 01/07/2019   AST 20 01/07/2019   NA 139 01/07/2019   K 3.8 01/07/2019   CL 101 01/07/2019   CREATININE 0.79 01/07/2019   BUN 15 01/07/2019   CO2 31 01/07/2019   TSH 2.00 01/07/2019   HGBA1C 6.2 01/07/2019    Lab Results  Component Value Date   TSH 2.00 01/07/2019   Lab Results  Component  Value Date   WBC 6.8 01/07/2019   HGB 13.2 01/07/2019   HCT 38.7 01/07/2019   MCV 89.7 01/07/2019   PLT 263.0 01/07/2019   Lab Results  Component Value Date   NA 139 01/07/2019   K 3.8 01/07/2019   CHLORIDE 103 07/02/2017   CO2 31 01/07/2019   GLUCOSE 109 (H) 01/07/2019   BUN 15 01/07/2019   CREATININE 0.79 01/07/2019   BILITOT 0.3 01/07/2019   ALKPHOS 105 01/07/2019   AST 20 01/07/2019   ALT 19 01/07/2019   PROT 7.6 01/07/2019   ALBUMIN 4.5 01/07/2019   CALCIUM 9.4 01/07/2019   ANIONGAP 9 06/29/2018   EGFR >60 07/02/2017   GFR 74.91 01/07/2019   Lab Results  Component Value Date   CHOL 220 (H) 01/07/2019   Lab Results  Component Value Date   HDL 48.00 01/07/2019   No results found for: [facility] Lab Results  Component Value Date   TRIG 235.0 (H) 01/07/2019   Lab Results  Component Value Date   CHOLHDL 5 01/07/2019   Lab Results  Component Value Date   HGBA1C 6.2 01/07/2019       Assessment & Plan:   Problem List Items Addressed This Visit    Obesity (BMI 30.0-34.9)    Encouraged heart healthy diet, decrease po intake and increase exercise as tolerated. Needs 7-8 hours of sleep nightly. Avoid trans fats, eat small, frequent meals every 4-5 hours with lean proteins, complex carbs and healthy fats. Minimize simple carbs and processed foods.       Hyperlipidemia, mixed    Encouraged  heart healthy diet, increase exercise, avoid trans fats, consider a krill oil cap daily. Is more open to the idea of a statin given her stress and other co morbidites. Will recheck lipid panel      Relevant Medications   carvedilol (COREG) 3.125 MG tablet   Palpitation    Intermittent and without associated symptoms, not worsening.      Anxiety    Is exceedingly stressed caring for her 35 year old father, her 4 year old mother, her adult special needs son who requires complete care and now she is in [place] to attend the funeral of her brother in law who just passed away. Is  using the Alprazolam very infrequently but does not require further meds at this time.       Hyperglycemia    hgba1c acceptable, minimize simple carbs. Increase exercise as tolerated.      Hypertension    She has been monitoring her blood pressures lately and seeing numbers often in the 287G systolic and over 811 diastolic. She is under a good deal of stress but is agreeing to start on Carvedilol 3.125 mg bid and reassess with BP check next week.      Relevant Medications   carvedilol (COREG) 3.125 MG tablet      I have discontinued Zaryiah Dingledine's benzonatate, HYDROcodone-homatropine, and doxycycline. I am also having her start on carvedilol. Additionally, I am having her maintain her multivitamin with minerals, albuterol, ALPRAZolam, blood glucose meter kit and supplies, and Spacer/Aero-Holding Chambers.  Meds ordered this encounter  Medications  . carvedilol (COREG) 3.125 MG tablet    Sig: Take 1 tablet (3.125 mg total) by mouth 2 (two) times daily with a meal.    Dispense:  60 tablet    Refill:  3     Penni Homans, MD

## 2019-04-14 NOTE — Assessment & Plan Note (Signed)
Encouraged heart healthy diet, decrease po intake and increase exercise as tolerated. Needs 7-8 hours of sleep nightly. Avoid trans fats, eat small, frequent meals every 4-5 hours with lean proteins, complex carbs and healthy fats. Minimize simple carbs and processed foods.

## 2019-04-14 NOTE — Assessment & Plan Note (Signed)
She has been monitoring her blood pressures lately and seeing numbers often in the [id number] systolic and over [id number] diastolic. She is under a good deal of stress but is agreeing to start on Carvedilol 3.125 mg bid and reassess with BP check next week.

## 2019-04-14 NOTE — Assessment & Plan Note (Signed)
hgba1c acceptable, minimize simple carbs. Increase exercise as tolerated.  

## 2019-04-14 NOTE — Assessment & Plan Note (Signed)
Is exceedingly stressed caring for her 57 year old father, her 20 year old mother, her adult special needs son who requires complete care and now she is in [place] to attend the funeral of her brother in law who just passed away. Is using the Alprazolam very infrequently but does not require further meds at this time.

## 2019-04-14 NOTE — Assessment & Plan Note (Signed)
Encouraged heart healthy diet, increase exercise, avoid trans fats, consider a krill oil cap daily. Is more open to the idea of a statin given her stress and other co morbidites. Will recheck lipid panel

## 2019-04-14 NOTE — Assessment & Plan Note (Signed)
Intermittent and without associated symptoms, not worsening.

## 2019-04-21 ENCOUNTER — Other Ambulatory Visit: Payer: Self-pay | Admitting: *Deleted

## 2019-04-21 ENCOUNTER — Other Ambulatory Visit: Payer: Self-pay | Admitting: Family Medicine

## 2019-04-21 ENCOUNTER — Telehealth: Payer: Self-pay | Admitting: *Deleted

## 2019-04-21 DIAGNOSIS — E782 Mixed hyperlipidemia: Secondary | ICD-10-CM

## 2019-04-21 DIAGNOSIS — I10 Essential (primary) hypertension: Secondary | ICD-10-CM

## 2019-04-21 DIAGNOSIS — R739 Hyperglycemia, unspecified: Secondary | ICD-10-CM

## 2019-04-21 NOTE — Telephone Encounter (Signed)
Pt has lab appt scheduled for Friday, 9/18. I do not see any future orders in Epic. Please place appropriate orders. Thank You!

## 2019-04-21 NOTE — Telephone Encounter (Signed)
Future orders in

## 2019-04-22 MED FILL — CARVEDILOL 3.125 MG TABLET: 3.125 | 30 days supply | Qty: 60 | Fill #0

## 2019-04-23 ENCOUNTER — Other Ambulatory Visit: Payer: 59

## 2019-04-23 ENCOUNTER — Ambulatory Visit: Payer: 59

## 2019-04-30 ENCOUNTER — Other Ambulatory Visit: Payer: Self-pay | Admitting: Family Medicine

## 2019-04-30 ENCOUNTER — Encounter: Payer: Self-pay | Admitting: Family Medicine

## 2019-04-30 MED ORDER — LANCETS MISC: 12 refills | Status: DC

## 2019-04-30 MED ORDER — GLUCOSE BLOOD VI STRP: ORAL_STRIP | 12 refills | Status: AC

## 2019-04-30 MED FILL — FREESTYLE LANCETS: 90 days supply | Qty: 400 | Fill #0

## 2019-04-30 MED FILL — FREESTYLE LITE TEST STRIP: 90 days supply | Qty: 400 | Fill #0

## 2019-05-14 ENCOUNTER — Ambulatory Visit: Payer: 59 | Admitting: Family Medicine

## 2019-07-09 ENCOUNTER — Other Ambulatory Visit: Payer: Self-pay | Admitting: *Deleted

## 2019-07-09 DIAGNOSIS — C50411 Malignant neoplasm of upper-outer quadrant of right female breast: Secondary | ICD-10-CM

## 2019-07-09 DIAGNOSIS — Z170 Estrogen receptor positive status [ER+]: Secondary | ICD-10-CM

## 2019-07-11 NOTE — Progress Notes (Signed)
ID: Miranda Orozco   DOB: November 06, 1960  MR#: [id number]  CSN#:[id number]  Patient Care Team: Mosie Lukes, MD as PCP - General (Family Medicine) Neldon Mc, MD (General Surgery) , Virgie Dad, MD (Hematology and Oncology) Leo Grosser, Seymour Bars, MD (Inactive) (Obstetrics and Gynecology) Bo Merino, MD (Rheumatology) Juanita Craver, MD as Consulting Physician (Gastroenterology) OTHER MD:   CHIEF COMPLAINT: Estrogen receptor positive breast cancer (s/p right mastectomy)  CURRENT TREATMENT: Observation  MS [facility] DID NOT SHOW FOR HER 07/12/2019 APPOINTMENT  INTERVAL HISTORY: Miranda Orozco was scheduled today for follow-up of her estrogen receptor positive breast cancer.    Since her last visit, she underwent left diagnostic mammography with tomography at [facility] on 07/22/2018 showing: breast density category C; no evidence of malignancy.  She also underwent breast MRI at [facility] on 07/28/2018, which showed no suspicious abnormalities.  She also underwent left forearm skin shave biopsy on 09/11/2018. Pathology [phone]) showed verruca vulgaris.  She also underwent Covid-19 antibody screening on 01/07/2019, which was negative.    REVIEW OF SYSTEMS: Myliyah    HISTORY OF PRESENT ILLNESS: From the original intake note:  Around November 2010 Miranda Orozco herself palpated what felt like a mass in the upper portion of her right breast. She thought this likely was connected with menstruation but when it persisted into January, she brought it to Dr. Caralee Ates attention and was set up for mammography January 14th,.  Dr. Derrel Nip confirmed a vague palpable focus over the upper outer right breast.  The mammogram was heterogeneously dense with mildly increased density in the upper outer right breast.  There were a few scattered microcalcifications that were not significantly changed.  There was no definite focal abnormality to correspond to the palpable abnormality.  Ultrasound showed a vague hypoechoic area at  the 10 o'clock position 5 cm from the nipple, corresponding to the palpable abnormality.    Biopsy of this area was performed January 18th and showed ([id number]) an invasive ductal carcinoma which appeared low grade. There was no evidence of angiolymphatic invasion.  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  The prognostic profile showed the tumor to be ER positive at 84%, PR positive at 85% with a low proliferation marker at 11% and Her-2 negative by CISH with a ratio of 1.04.    The patient was referred to Dr. Barry Dienes and bilateral breast MRIs were obtained January 24th.  This showed a large area of patchy, non-mass like low grade enhancement in the upper outer quadrant of the right breast, extending up to 10.7 cm maximally.  There were no abnormal appearing lymph nodes and no other areas of enhancement suspicious for malignancy in either breast.   The patient's subsequent history is as detailed below.   PAST MEDICAL HISTORY: Past Medical History:  Diagnosis Date  . Arthritis, rheumatic, acute or subacute 10/02/2009   RA  . BREAST CANCER, HX OF 10/02/2009  . Disorder of bone and cartilage 10/02/2009   Qualifier: Diagnosis of  By: [name] MD, [name]   . Esophageal reflux 10/02/2009  . Hives 09/18/2014  . MALIGNANT NEOPLASM OF BREAST UNSPECIFIED SITE 10/02/2009   RIGHT CHMOE AND RADIATION DONE  . Obesity   . OSTEOPENIA 10/02/2009  Significant for a diagnosis of fibroadenoma made by left breast biopsy in June 2007, a history of C-section times two, a history of surgery for significant left chest wall cellulitis apparently arising from a sebaceous cyst, history of degenerative disk disease and a history of rheumatoid arthritis   PAST SURGICAL  HISTORY: Past Surgical History:  Procedure Laterality Date  . BREAST LUMPECTOMY  03/20/10   for margin  . BREAST LUMPECTOMY  03/26/2010   for margin  . CERVICAL CONIZATION W/BX Bilateral 02/06/2018   Procedure: CONIZATION CERVIX  WITH BIOPSY;  Surgeon: Eldred Manges, MD;  Location: [facility];  Service: Gynecology;  Laterality: Bilateral;  . [place], 2001  . COLOMSCOPY  2018  . DILATATION & CURRETTAGE/HYSTEROSCOPY WITH RESECTOCOPE N/A 02/19/2013   Procedure: DILATATION & CURETTAGE/HYSTEROSCOPY WITH RESECTION OF POLYP;  Surgeon: Eldred Manges, MD;  Location: [facility];  Service: Gynecology;  Laterality: N/A;  . MASTECTOMY PARTIAL / LUMPECTOMY W/ AXILLARY LYMPHADENECTOMY  03/12/2010   Right w SLN Dr Margot Chimes  . SIMPLE MASTECTOMY  03/31/2011   For margin    FAMILY HISTORY Family History  Problem Relation Age of Onset  . Hypertension Mother   . Hyperlipidemia Mother   . Hypertension Father   . Diabetes Father   . Cancer Father        lung cancer, smoker  . Arthritis Other   . Diabetes Other   . Hypertension Other   . Colon cancer Neg Hx   . Stomach cancer Neg Hx   The patient's father is alive with has a history of type 2 diabetes, hypertension, and hypercholesterolemia.  The patient's mother is alive, with a history of hypertension.   GYNECOLOGIC HISTORY: She is Gx, P3, first pregnancy age 34.  She was premenopausal at the time of her breast cancer diagnosis.   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  Her husband [name] of course is our Social research officer, government at [employer]. Daughter in getting married next year, she met her fiance in college who works at [employer].   ADVANCED DIRECTIVES: In the absence of any documentation to the contrary, the patient's spouse is their HCPOA.   HEALTH MAINTENANCE: Social History   Tobacco Use  . Smoking status: Former Smoker    Packs/day: 0.50    Years: 1.00    Pack years: 0.50    Types: Cigarettes    Quit date: 10/17/1981    Years since quitting: 37.7  . Smokeless tobacco: Never Used  Substance Use Topics  . Alcohol use: Yes    Alcohol/week: 2.0 - 3.0 standard drinks     Types: 2 - 3 Glasses of wine per week  . Drug use: No     Colonoscopy:  May 2018/[facility]  PAP: Up-to-date   Bone density: 04/02/2013, normal  Lipid panel:  No Known Allergies  Current Outpatient Medications  Medication Sig Dispense Refill  . albuterol (PROVENTIL HFA;VENTOLIN HFA) 108 (90 Base) MCG/ACT inhaler Inhale 2 puffs into the lungs every 6 (six) hours as needed for wheezing or shortness of breath. 1 Inhaler 0  . ALPRAZolam (XANAX) 0.25 MG tablet Take 1 tablet (0.25 mg total) by mouth 2 (two) times daily as needed for anxiety or sleep. 20 tablet 1  . blood glucose meter kit and supplies Dispense based on patient and insurance preference. Use up to four times daily as directed. Dz:E11.9 Check BS TID prn 1 each 0  . carvedilol (COREG) 3.125 MG tablet Take 1 tablet (3.125 mg total) by mouth 2 (two) times daily with a meal. 60 tablet 3  . FREESTYLE LITE test strip USE TO TEST UP TO FOUR TIMES DAILY AS DIRECTED 100 strip 0  . glucose blood test strip Use as instructed. Dispense  based on patient and insurance preference. Use up to four times daily as directed. Dz:E11.9  Check BS TID prn 100 each 12  . Lancets (FREESTYLE) lancets USE TO TEST UP TO FOUR TIMES DAILY AS DIRECTED 100 each 0  . Lancets MISC Dispense based on patient and insurance preference. Use up to four times daily as directed. Dz:E11.9  Check BS TID prn 100 each 12  . Multiple Vitamins-Minerals (MULTIVITAMIN WITH MINERALS) tablet Take 1 tablet by mouth daily.    Marland Kitchen Spacer/Aero-Holding Chambers DEVI 1 Device by Does not apply route 4 (four) times daily as needed. 1 each 1   No current facility-administered medications for this visit.     OBJECTIVE: Middle-aged white woman   There were no vitals filed for this visit.   There is no height or weight on file to calculate BMI.    ECOG FS: 1 There were no vitals filed for this visit.       LAB RESULTS: Lab Results  Component Value Date   WBC 6.8 01/07/2019   NEUTROABS  4.0 06/29/2018   HGB 13.2 01/07/2019   HCT 38.7 01/07/2019   MCV 89.7 01/07/2019   PLT 263.0 01/07/2019      Chemistry      Component Value Date/Time   NA 139 01/07/2019 1516   NA 139 07/02/2017 1211   K 3.8 01/07/2019 1516   K 3.9 07/02/2017 1211   CL 101 01/07/2019 1516   CO2 31 01/07/2019 1516   CO2 28 07/02/2017 1211   BUN 15 01/07/2019 1516   BUN 14.1 07/02/2017 1211   CREATININE 0.79 01/07/2019 1516   CREATININE 0.8 07/02/2017 1211      Component Value Date/Time   CALCIUM 9.4 01/07/2019 1516   CALCIUM 9.5 07/02/2017 1211   ALKPHOS 105 01/07/2019 1516   ALKPHOS 96 07/02/2017 1211   AST 20 01/07/2019 1516   AST 38 (H) 07/02/2017 1211   ALT 19 01/07/2019 1516   ALT 45 07/02/2017 1211   BILITOT 0.3 01/07/2019 1516   BILITOT 0.41 07/02/2017 1211       Lab Results  Component Value Date   LABCA2 21 09/05/2011    No components found for: [id number]  No results for input(s): INR in the last 168 hours.  Urinalysis    Component Value Date/Time   LABSPEC 1.025 03/20/2011 1405   PHURINE 6.0 03/20/2011 1405   PHURINE 5.0 12/31/2010 1121   GLUCOSEU 250 (A) 12/31/2010 1121   HGBUR Moderate 03/20/2011 1405   HGBUR MODERATE (A) 12/31/2010 1121   BILIRUBINUR n 09/10/2013 1608   BILIRUBINUR Negative 03/20/2011 1405   KETONESUR Negative 03/20/2011 1405   KETONESUR NEGATIVE 12/31/2010 1121   PROTEINUR n 09/10/2013 1608   PROTEINUR Negative 03/20/2011 1405   PROTEINUR 30 (A) 12/31/2010 1121   UROBILINOGEN 0.2 09/10/2013 1608   UROBILINOGEN 1.0 12/31/2010 1121   NITRITE n 09/10/2013 1608   NITRITE Negative 03/20/2011 1405   NITRITE POSITIVE (A) 12/31/2010 1121   LEUKOCYTESUR small (1+) 09/10/2013 1608   LEUKOCYTESUR Moderate 03/20/2011 1405    STUDIES: No results found.   ASSESSMENT: 58 y.o. [place] woman status post right breast biopsy January of 2011 for a clinically T3 N0 (stage IIB) invasive ductal carcinoma, grade 1, strongly estrogen and progesterone  receptor positive, HER-2 negative, with an MIB-1 of 18%, in the setting of extensive ductal carcinoma in situ;  (1) treated neoadjuvantly with doxorubicin/ cyclophosphamide in dose dense fashion x4 followed by weekly paclitaxel x12   (2)  followed by lumpectomy and sentinel lymph node sampling 03/24/2010 showing no residual invasive disease but a 2.8 cm area of ductal carcinoma in situ, with positive margins.  All 4 sentinel lymph nodes were negative.  Margins could not be cleared despite repeated attempts and she underwent definitive mastectomy August of 2011 showing no residual carcinoma.    (3) She completed post mastectomy radiation including the right supraclavicular area in November of 2011   (4) started tamoxifen November 2011, stopped it August 2016 due to concerns regarding a rash.   PLAN:  I contacted the patient 07/14/2019.  She was not aware there had been an appointment 07/12/2019.  She is due for mammography this month but she would prefer to do it after the turn of the year as she has a lot of family currently visiting.  We are rescheduling her appointment for late January accordingly   , Virgie Dad, MD  07/14/19 11:23 AM Medical Oncology and Hematology [facility] [place], [place] [address] Tel. [phone]    Fax. [phone]    I, Wilburn Mylar, am acting as scribe for Dr. Virgie Dad. .  I, Lurline Del MD, have reviewed the above documentation for accuracy and completeness, and I agree with the above.

## 2019-07-12 ENCOUNTER — Inpatient Hospital Stay: Payer: 59 | Attending: Oncology

## 2019-07-12 ENCOUNTER — Inpatient Hospital Stay (HOSPITAL_BASED_OUTPATIENT_CLINIC_OR_DEPARTMENT_OTHER): Payer: 59 | Admitting: Oncology

## 2019-07-12 DIAGNOSIS — Z170 Estrogen receptor positive status [ER+]: Secondary | ICD-10-CM

## 2019-07-12 DIAGNOSIS — M069 Rheumatoid arthritis, unspecified: Secondary | ICD-10-CM

## 2019-07-12 DIAGNOSIS — C50411 Malignant neoplasm of upper-outer quadrant of right female breast: Secondary | ICD-10-CM

## 2019-07-22 DIAGNOSIS — Z853 Personal history of malignant neoplasm of breast: Secondary | ICD-10-CM | POA: Diagnosis not present

## 2019-07-22 DIAGNOSIS — Z1231 Encounter for screening mammogram for malignant neoplasm of breast: Secondary | ICD-10-CM | POA: Diagnosis not present

## 2019-08-19 MED FILL — CARVEDILOL 3.125 MG TABLET: 3.125 | 30 days supply | Qty: 60 | Fill #1

## 2019-10-06 ENCOUNTER — Ambulatory Visit (INDEPENDENT_AMBULATORY_CARE_PROVIDER_SITE_OTHER): Payer: Self-pay | Admitting: Medical

## 2019-10-06 ENCOUNTER — Other Ambulatory Visit: Payer: Self-pay

## 2019-10-06 VITALS — BP 140/91 | HR 88

## 2019-10-06 DIAGNOSIS — L089 Local infection of the skin and subcutaneous tissue, unspecified: Secondary | ICD-10-CM

## 2019-10-06 DIAGNOSIS — I10 Essential (primary) hypertension: Secondary | ICD-10-CM

## 2019-10-06 DIAGNOSIS — F419 Anxiety disorder, unspecified: Secondary | ICD-10-CM

## 2019-10-06 MED ORDER — DOXYCYCLINE HYCLATE 100 MG PO TABS: 100.0000 mg | ORAL_TABLET | Freq: Two times a day (BID) | ORAL | 0 refills | Status: DC

## 2019-10-06 MED ORDER — CARVEDILOL 3.125 MG PO TABS: 3.1250 mg | ORAL_TABLET | Freq: Two times a day (BID) | ORAL | 3 refills | Status: DC

## 2019-10-06 MED ORDER — ALPRAZOLAM 0.25 MG PO TABS: 0.2500 mg | ORAL_TABLET | Freq: Two times a day (BID) | ORAL | 1 refills | Status: DC | PRN

## 2019-10-06 MED FILL — ALPRAZolam 0.25 MG TABS: 0.25 | 10 days supply | Qty: 20 | Fill #0

## 2019-10-06 MED FILL — DOXYCYCLINE HYCLATE 100 MG: 100 | 7 days supply | Qty: 14 | Fill #0

## 2019-10-06 MED FILL — CARVEDILOL 3.125 MG TABLET: 3.125 | 30 days supply | Qty: 60 | Fill #0

## 2019-10-06 NOTE — Patient Instructions (Signed)
Patient appears to have right index finger skin infection/paronychia.  Will prescribe doxycycline antibiotic for 7 days.  Rx advisement given.  Can continue the warm salt water soaks twice daily.  If any residual redness tenderness or warmth by 7 days asked her to give [place] an update and would give additional 3 days.  Any worsening or changing signs symptoms contact us sooner.  Blood pressure is borderline well controlled today.  She is recently more stressed by dealing with family health issues.  Refilled her carvedilol.  Recent anxiety increase related to taking care of elderly parents and disabled son.  History of rare infrequent use of Xanax.  Limited prescription prescribed 2 years ago just now running out.  Gave new prescription.  Will defer to her PCP whether or not she should asked contract and give UDS.  Follow-up in 7 to 10 days or as needed.

## 2019-10-06 NOTE — Progress Notes (Signed)
   Subjective:    Patient ID: Miranda Orozco, female    DOB: 1960-09-10, 59 y.o.   MRN: [id number]  HPI  Virtual Visit via Video Note  I connected with Miranda Orozco on 10/06/19 at  2:00 PM EST by a video enabled telemedicine application and verified that I am speaking with the correct person using two identifiers.  Location: Patient: home Provider: office   I discussed the limitations of evaluation and management by telemedicine and the availability of in person appointments. The patient expressed understanding and agreed to proceed.  History of Present Illness:  Pt state rt index finger base of nail got red mild swollen area. Little tender to palpation and when she hit finger against things will hurt. When hand hangs tip of finger will throb. She was picking at base of nail before got red.  Pt is rt handed.  Htn-her bp is borderline today. Sometimes better.  Recent anxiety. Some stress related to her dad has some health issues. 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 Pt rarely uses xanax. Pt last filled in 2019. About to run out of meds.    Observations/Objective: General-no acute distress, pleasant, oriented. Lungs- on inspection lungs appear unlabored. Neck- no tracheal deviation or jvd on inspection. Neuro- gross motor function appears intact.  Rt second digit- at base of [employer]hu pinkish red area with mild swelling pad of finger normal. No dc.  Assessment and Plan: Patient appears to have right index finger skin infection/paronychia.  Will prescribe doxycycline antibiotic for 7 days.  Rx advisement given.  Can continue the warm salt water soaks twice daily.  If any residual redness tenderness or warmth by 7 days asked her to give [place] an update and would give additional 3 days.  Any worsening or changing signs symptoms contact us sooner.  Blood pressure is borderline well controlled today.  She is recently more stressed by dealing with family health issues.   Refilled her carvedilol.  Recent anxiety increase related to taking care of elderly parents and disabled son.  History of rare infrequent use of Xanax.  Limited prescription prescribed 2 years ago just now running out.  Gave new prescription.  Will defer to her PCP whether or not she should asked contract and give UDS.  Follow-up in 7 to 10 days or as needed.  20 minutes spent with patient today.  Mackie Pai, PA-C  Follow Up Instructions:    I discussed the assessment and treatment plan with the patient. The patient was provided an opportunity to ask questions and all were answered. The patient agreed with the plan and demonstrated an understanding of the instructions.   The patient was advised to call back or seek an in-person evaluation if the symptoms worsen or if the condition fails to improve as anticipated.  I provided  20 minutes of non-face-to-face time during this encounter.   Mackie Pai, PA-C      Review of Systems     Objective:   Physical Exam        Assessment & Plan:

## 2019-10-08 ENCOUNTER — Ambulatory Visit: Payer: Self-pay

## 2019-10-14 ENCOUNTER — Other Ambulatory Visit: Payer: Self-pay

## 2019-10-14 ENCOUNTER — Ambulatory Visit: Payer: Self-pay | Attending: Internal Medicine

## 2019-10-14 DIAGNOSIS — Z23 Encounter for immunization: Secondary | ICD-10-CM

## 2019-10-14 NOTE — Progress Notes (Signed)
   Covid-19 Vaccination Clinic  Name:  Miranda Orozco    MRN: [id number] DOB: 1960-11-02  10/14/2019  [name] was observed post Covid-19 immunization for 15 minutes without incident. She was provided with Vaccine Information Sheet and instruction to access the V-Safe system.   [name] was instructed to call 911 with any severe reactions post vaccine: Marland Kitchen Difficulty breathing  . Swelling of face and throat  . A fast heartbeat  . A bad rash all over body  . Dizziness and weakness   Immunizations Administered    Name Date Dose VIS Date Route   Pfizer COVID-19 Vaccine 10/14/2019  9:21 AM 0.3 mL 07/16/2019 Intramuscular   Manufacturer: [place]   Lot: [id number]   [place]: [id number]

## 2019-11-08 ENCOUNTER — Ambulatory Visit: Payer: Self-pay | Attending: Internal Medicine

## 2019-11-08 DIAGNOSIS — Z23 Encounter for immunization: Secondary | ICD-10-CM

## 2019-11-08 NOTE — Progress Notes (Signed)
   Covid-19 Vaccination Clinic  Name:  Miranda Orozco    MRN: [id number] DOB: [birth year]  11/08/2019  [name] was observed post Covid-19 immunization for 15 minutes without incident. She was provided with Vaccine Information Sheet and instruction to access the V-Safe system.   [name] was instructed to call 911 with any severe reactions post vaccine: Marland Kitchen Difficulty breathing  . Swelling of face and throat  . A fast heartbeat  . A bad rash all over body  . Dizziness and weakness   Immunizations Administered    Name Date Dose VIS Date Route   Pfizer COVID-19 Vaccine 11/08/2019  9:03 AM 0.3 mL 07/16/2019 Intramuscular   Manufacturer: [place]   Lot: [id number]   [place]: [id number]

## 2019-11-18 ENCOUNTER — Encounter: Payer: Self-pay | Admitting: Family Medicine

## 2019-11-18 NOTE — Telephone Encounter (Signed)
PT sent to Triage stated her current pressure was 136/85

## 2019-11-22 ENCOUNTER — Telehealth: Payer: Self-pay

## 2019-11-22 NOTE — Telephone Encounter (Signed)
Nurse Assessment Nurse: Matthew Folks, RN, Hannelore Date/Time (Eastern Time): 11/18/2019 3:32:31 PM Confirm and document reason for call. If symptomatic, describe symptoms. ---[name] states her bp around noon was 135/85. She had a headache, mild this am. Has the patient had close contact with a person known or suspected to have the novel coronavirus illness OR traveled / lives in area with major community spread (including international travel) in the last 14 days from the onset of symptoms? * If Asymptomatic, screen for exposure and travel within the last 14 days. ---No Does the patient have any new or worsening symptoms? ---Yes Will a triage be completed? ---Yes Related visit to physician within the last 2 weeks? ---No Does the PT have any chronic conditions? (i.e. diabetes, asthma, this includes High risk factors for pregnancy, etc.) ---Yes List chronic conditions. ---Started bp meds last Sept/Oct. Is this a behavioral health or substance abuse call? ---No Guidelines Guideline Title Affirmed Question Affirmed Notes Nurse Date/Time (Eastern Time) Blood Pressure - High [id number] Systolic BP >= [id number] OR Diastolic >= 80 AND [id number] taking BP medications Shook-Minyard, RN, Hannelore 11/18/2019 3:34:11 PM Disp. 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 Her parents live in a condo. User: Janece Canterbury, RN Date/Time Eilene Ghazi Time): 11/18/2019 3:42:59 PM She takes her bp meds bid, but not on schedule correctly. She will do better with that. User: Janece Canterbury, RN Date/Time Eilene Ghazi Time): 11/18/2019 3:43:21 PM Have her logging her bp bid too. Referrals REFERRED TO PCP OFFIC

## 2019-11-22 NOTE — Telephone Encounter (Signed)
See my chart note.

## 2019-12-06 MED FILL — CARVEDILOL 3.125 MG TABLET: 3.125 | 30 days supply | Qty: 60 | Fill #1

## 2020-01-26 MED FILL — CARVEDILOL 3.125 MG TABLET: 3.125 | 30 days supply | Qty: 60 | Fill #2

## 2020-04-05 MED FILL — CARVEDILOL 3.125 MG TABLET: 3.125 | 30 days supply | Qty: 60 | Fill #3

## 2020-04-25 ENCOUNTER — Other Ambulatory Visit: Payer: Self-pay | Admitting: Medical

## 2020-04-25 ENCOUNTER — Other Ambulatory Visit: Payer: Self-pay | Admitting: Family Medicine

## 2020-04-25 NOTE — Telephone Encounter (Signed)
I have refilled but it is 6 months past her last visit so if she needs further refills prior to her annual exam she will need an appointment to evaluate

## 2020-04-25 NOTE — Telephone Encounter (Signed)
Requesting: xanax Contract:n/a UDS:n/a Last Visit:10/06/19 Next Visit:n/a Last Refill:10/06/19  Please Advise

## 2020-04-26 MED FILL — ALPRAZolam 0.25 MG TABS: 0.25 | 10 days supply | Qty: 20 | Fill #0

## 2020-04-26 NOTE — Telephone Encounter (Signed)
Pt called and lvm to return call 

## 2020-04-28 NOTE — Telephone Encounter (Signed)
LMOM for patient to call back.

## 2020-05-01 ENCOUNTER — Other Ambulatory Visit: Payer: Self-pay | Admitting: Family Medicine

## 2020-05-01 ENCOUNTER — Encounter: Payer: Self-pay | Admitting: Family Medicine

## 2020-05-01 ENCOUNTER — Other Ambulatory Visit: Payer: Self-pay

## 2020-05-01 ENCOUNTER — Telehealth (INDEPENDENT_AMBULATORY_CARE_PROVIDER_SITE_OTHER): Payer: Self-pay | Admitting: Family Medicine

## 2020-05-01 DIAGNOSIS — E669 Obesity, unspecified: Secondary | ICD-10-CM

## 2020-05-01 DIAGNOSIS — R739 Hyperglycemia, unspecified: Secondary | ICD-10-CM

## 2020-05-01 DIAGNOSIS — F419 Anxiety disorder, unspecified: Secondary | ICD-10-CM

## 2020-05-01 DIAGNOSIS — E66811 Obesity, class 1: Secondary | ICD-10-CM

## 2020-05-01 DIAGNOSIS — I10 Essential (primary) hypertension: Secondary | ICD-10-CM

## 2020-05-01 MED ORDER — CARVEDILOL 6.25 MG PO TABS: 6.2500 mg | ORAL_TABLET | Freq: Two times a day (BID) | ORAL | 2 refills | Status: DC

## 2020-05-01 MED FILL — CARVEDILOL 6.25 MG TABLET: 6.25 | 30 days supply | Qty: 60 | Fill #0

## 2020-05-01 NOTE — Assessment & Plan Note (Signed)
hgba1c acceptable, minimize simple carbs. Increase exercise as tolerated.  

## 2020-05-01 NOTE — Progress Notes (Addendum)
Virtual Visit via Video Note  I connected with [name] on 05/02/20 at  3:00 PM EDT by a video enabled telemedicine application and verified that I am speaking with the correct person using two identifiers.  Location: Patient: home, patient and provider were in visit Provider: office   I discussed the limitations of evaluation and management by telemedicine and the availability of in person appointments. The patient expressed understanding and agreed to proceed. Nani Skillern, CMA was able to get the patient set up on a video visit    Subjective:    Patient ID: Miranda Orozco, female    DOB: [birth year], 59 y.o.   MRN: [id number]  Chief Complaint  Patient presents with  . Medication Check/Concerns    HPI Patient is in today for follow upon chronic medical concerns. She has been very tired and stressed recently. She continues to struggle with juggling numerous competing commitments such as caring for her aging and difficult mother, caring for her 51 year old special needs son, the cancer diagnosis for both she and her husband and more in the past year. She is using alprazolam very infrequently but is in need of a refill today. She finds it helpful but does not take it often. Denies CP/palp/SOB/HA/congestion/fevers/GI or GU c/o. Taking meds as prescribed  Past Medical History:  Diagnosis Date  . Arthritis, rheumatic, acute or subacute 10/02/2009   RA  . BREAST CANCER, HX OF 10/02/2009  . Disorder of bone and cartilage 10/02/2009   Qualifier: Diagnosis of  By: [name] MD, [name]   . Esophageal reflux 10/02/2009  . Hives 09/18/2014  . MALIGNANT NEOPLASM OF BREAST UNSPECIFIED SITE 10/02/2009   RIGHT CHMOE AND RADIATION DONE  . Obesity   . OSTEOPENIA 10/02/2009    Past Surgical History:  Procedure Laterality Date  . BREAST LUMPECTOMY  03/20/10   for margin  . BREAST LUMPECTOMY  03/26/2010   for margin  . CERVICAL CONIZATION W/BX Bilateral 02/06/2018   Procedure: CONIZATION  CERVIX WITH BIOPSY;  Surgeon: Eldred Manges, MD;  Location: [facility];  Service: Gynecology;  Laterality: Bilateral;  . [place], 2001  . COLOMSCOPY  2018  . DILATATION & CURRETTAGE/HYSTEROSCOPY WITH RESECTOCOPE N/A 02/19/2013   Procedure: DILATATION & CURETTAGE/HYSTEROSCOPY WITH RESECTION OF POLYP;  Surgeon: Eldred Manges, MD;  Location: [facility];  Service: Gynecology;  Laterality: N/A;  . MASTECTOMY PARTIAL / LUMPECTOMY W/ AXILLARY LYMPHADENECTOMY  03/12/2010   Right w SLN Dr Margot Chimes  . SIMPLE MASTECTOMY  03/31/2011   For margin    Family History  Problem Relation Age of Onset  . Hypertension Mother   . Hyperlipidemia Mother   . Hypertension Father   . Diabetes Father   . Cancer Father        lung cancer, smoker  . Arthritis Other   . Diabetes Other   . Hypertension Other   . Colon cancer Neg Hx   . Stomach cancer Neg Hx     Social History   Socioeconomic History  . Marital status: Married    Spouse name: Not on file  . Number of children: Not on file  . Years of education: Not on file  . Highest education level: Not on file  Occupational History  . Not on file  Tobacco Use  . Smoking status: Former Smoker    Packs/day: 0.50    Years: 1.00    Pack years:  0.50    Types: Cigarettes    Quit date: 10/17/1981    Years since quitting: 38.5  . Smokeless tobacco: Never Used  Vaping Use  . Vaping Use: Never used  Substance and Sexual Activity  . Alcohol use: Yes    Alcohol/week: 2.0 - 3.0 standard drinks    Types: 2 - 3 Glasses of wine per week  . Drug use: No  . Sexual activity: Yes    Birth control/protection: None    Comment: lives with husband and children, no dietary restriction  Other Topics Concern  . Not on file  Social History Narrative  . Not on file   Social Determinants of Health   Financial Resource Strain:   . Difficulty of Paying Living Expenses: Not on file  Food Insecurity:   . Worried About Sales executive in the Last Year: Not on file  . Ran Out of Food in the Last Year: Not on file  Transportation Needs:   . Lack of Transportation (Medical): Not on file  . Lack of Transportation (Non-Medical): Not on file  Physical Activity:   . Days of Exercise per Week: Not on file  . Minutes of Exercise per Session: Not on file  Stress:   . Feeling of Stress : Not on file  Social Connections:   . Frequency of Communication with Friends and Family: Not on file  . Frequency of Social Gatherings with Friends and Family: Not on file  . Attends Religious Services: Not on file  . Active Member of Clubs or Organizations: Not on file  . Attends Archivist Meetings: Not on file  . Marital Status: Not on file  Intimate Partner Violence:   . Fear of Current or Ex-Partner: Not on file  . Emotionally Abused: Not on file  . Physically Abused: Not on file  . Sexually Abused: Not on file    Outpatient Medications Prior to Visit  Medication Sig Dispense Refill  . blood glucose meter kit and supplies Dispense based on patient and insurance preference. Use up to four times daily as directed. Dz:E11.9 Check BS TID prn 1 each 0  . FREESTYLE LITE test strip USE TO TEST UP TO FOUR TIMES DAILY AS DIRECTED 100 strip 0  . glucose blood test strip Use as instructed. Dispense based on patient and insurance preference. Use up to four times daily as directed. Dz:E11.9  Check BS TID prn 100 each 12  . Lancets (FREESTYLE) lancets USE TO TEST UP TO FOUR TIMES DAILY AS DIRECTED 100 each 0  . Lancets MISC Dispense based on patient and insurance preference. Use up to four times daily as directed. Dz:E11.9  Check BS TID prn 100 each 12  . Multiple Vitamins-Minerals (MULTIVITAMIN WITH MINERALS) tablet Take 1 tablet by mouth daily.    Marland Kitchen Spacer/Aero-Holding Chambers DEVI 1 Device by Does not apply route 4 (four) times daily as needed. 1 each 1  . ALPRAZolam (XANAX) 0.25 MG tablet TAKE 1 TABLET BY MOUTH TWICE DAILY AS  NEEDED FOR ANXIETY OR SLEEP 20 tablet 1  . carvedilol (COREG) 3.125 MG tablet Take 1 tablet (3.125 mg total) by mouth 2 (two) times daily with a meal. 60 tablet 3  . albuterol (PROVENTIL HFA;VENTOLIN HFA) 108 (90 Base) MCG/ACT inhaler Inhale 2 puffs into the lungs every 6 (six) hours as needed for wheezing or shortness of breath. 1 Inhaler 0  . doxycycline (VIBRA-TABS) 100 MG tablet Take 1 tablet (100 mg total) by mouth  2 (two) times daily. 14 tablet 0   No facility-administered medications prior to visit.    No Known Allergies  Review of Systems  Constitutional: Positive for malaise/fatigue. Negative for fever.  HENT: Negative for congestion.   Eyes: Negative for blurred vision.  Respiratory: Negative for shortness of breath.   Cardiovascular: Negative for chest pain, palpitations and leg swelling.  Gastrointestinal: Negative for abdominal pain, blood in stool and nausea.  Genitourinary: Negative for dysuria and frequency.  Musculoskeletal: Negative for falls.  Skin: Negative for rash.  Neurological: Negative for dizziness, loss of consciousness and headaches.  Endo/Heme/Allergies: Negative for environmental allergies.  Psychiatric/Behavioral: Negative for depression. The patient is nervous/anxious.        Objective:    Physical Exam Constitutional:      Appearance: Normal appearance. She is not ill-appearing.  HENT:     Head: Normocephalic and atraumatic.     Right Ear: External ear normal.     Left Ear: External ear normal.     Nose: Nose normal.  Eyes:     General:        Right eye: No discharge.        Left eye: No discharge.  Pulmonary:     Effort: Pulmonary effort is normal.  Neurological:     Mental Status: She is alert and oriented to person, place, and time.  Psychiatric:        Behavior: Behavior normal.     Ht '5\' 5"'  (1.651 m)   Wt 202 lb (91.6 kg)   BMI 33.61 kg/m  Wt Readings from Last 3 Encounters:  05/01/20 202 lb (91.6 kg)  04/13/19 198 lb (89.8  kg)  01/07/19 200 lb (90.7 kg)    Diabetic Foot Exam - Simple   No data filed     Lab Results  Component Value Date   WBC 6.8 01/07/2019   HGB 13.2 01/07/2019   HCT 38.7 01/07/2019   PLT 263.0 01/07/2019   GLUCOSE 109 (H) 01/07/2019   CHOL 220 (H) 01/07/2019   TRIG 235.0 (H) 01/07/2019   HDL 48.00 01/07/2019   LDLDIRECT 108.0 01/07/2019   ALT 19 01/07/2019   AST 20 01/07/2019   NA 139 01/07/2019   K 3.8 01/07/2019   CL 101 01/07/2019   CREATININE 0.79 01/07/2019   BUN 15 01/07/2019   CO2 31 01/07/2019   TSH 2.00 01/07/2019   HGBA1C 6.2 01/07/2019    Lab Results  Component Value Date   TSH 2.00 01/07/2019   Lab Results  Component Value Date   WBC 6.8 01/07/2019   HGB 13.2 01/07/2019   HCT 38.7 01/07/2019   MCV 89.7 01/07/2019   PLT 263.0 01/07/2019   Lab Results  Component Value Date   NA 139 01/07/2019   K 3.8 01/07/2019   CHLORIDE 103 07/02/2017   CO2 31 01/07/2019   GLUCOSE 109 (H) 01/07/2019   BUN 15 01/07/2019   CREATININE 0.79 01/07/2019   BILITOT 0.3 01/07/2019   ALKPHOS 105 01/07/2019   AST 20 01/07/2019   ALT 19 01/07/2019   PROT 7.6 01/07/2019   ALBUMIN 4.5 01/07/2019   CALCIUM 9.4 01/07/2019   ANIONGAP 9 06/29/2018   EGFR >60 07/02/2017   GFR 74.91 01/07/2019   Lab Results  Component Value Date   CHOL 220 (H) 01/07/2019   Lab Results  Component Value Date   HDL 48.00 01/07/2019   No results found for: [facility] Lab Results  Component Value Date   TRIG 235.0 (  H) 01/07/2019   Lab Results  Component Value Date   CHOLHDL 5 01/07/2019   Lab Results  Component Value Date   HGBA1C 6.2 01/07/2019       Assessment & Plan:   Problem List Items Addressed This Visit    Obesity (BMI 30.0-34.9)    Encouraged heart healthy diet as she is frustrated with her weight gain.      Anxiety    She continues to struggle with juggling numerous competing commitments such as caring for her aging and difficult mother, caring for her 35 year  old special needs son, the cancer diagnosis for herself and her husband's stage 3 Cancer diagnosis in past 2 years as well as her father passing away roughly 4 months ago. She is using alprazolam very infrequently but is in need of a refill today. She finds it helpful but does not take it often. She is advised if her anhedonia and symptoms worsen she may want to consider behavioral health counseling and an SSRI to help stabilize and lift her mood. She will think about it.       Hyperglycemia    hgba1c acceptable, minimize simple carbs. Increase exercise as tolerated.       Hypertension    Pressure continues to run high frequently even with the addition of Carvedilol will increase to 6.25 mg bid and reassess in one month.       Relevant Medications   carvedilol (COREG) 6.25 MG tablet      I have discontinued Rondia Rotenberry's albuterol, doxycycline, carvedilol, and ALPRAZolam. I am also having her start on carvedilol. Additionally, I am having her maintain her multivitamin with minerals, blood glucose meter kit and supplies, Spacer/Aero-Holding Chambers, freestyle, FREESTYLE LITE, Lancets, and glucose blood.  Meds ordered this encounter  Medications  . carvedilol (COREG) 6.25 MG tablet    Sig: Take 1 tablet (6.25 mg total) by mouth 2 (two) times daily with a meal.    Dispense:  60 tablet    Refill:  2    I discussed the assessment and treatment plan with the patient. The patient was provided an opportunity to ask questions and all were answered. The patient agreed with the plan and demonstrated an understanding of the instructions.   The patient was advised to call back or seek an in-person evaluation if the symptoms worsen or if the condition fails to improve as anticipated.  I provided 20 minutes of face-to-face time during this encounter.   Penni Homans, MD

## 2020-05-01 NOTE — Assessment & Plan Note (Signed)
Encouraged heart healthy diet as she is frustrated with her weight gain.

## 2020-05-01 NOTE — Assessment & Plan Note (Addendum)
She continues to struggle with juggling numerous competing commitments such as caring for her aging and difficult mother, caring for her 58 year old special needs son, the cancer diagnosis for herself and her husband's stage 3 Cancer diagnosis in past 2 years as well as her father passing away roughly 4 months ago. She is using alprazolam very infrequently but is in need of a refill today. She finds it helpful but does not take it often. She is advised if her anhedonia and symptoms worsen she may want to consider behavioral health counseling and an SSRI to help stabilize and lift her mood. She will think about it.

## 2020-05-01 NOTE — Assessment & Plan Note (Signed)
Pressure continues to run high frequently even with the addition of Carvedilol will increase to 6.25 mg bid and reassess in one month.

## 2020-05-03 ENCOUNTER — Telehealth: Payer: Self-pay | Admitting: *Deleted

## 2020-05-03 NOTE — Telephone Encounter (Signed)
Left message on machine to call back  Needs follow up HTN in 4-6 weeks (advised may be a little longerout) and also needs CPE in 4-6 mos.

## 2020-06-05 ENCOUNTER — Ambulatory Visit: Payer: Self-pay | Attending: Internal Medicine

## 2020-06-05 ENCOUNTER — Other Ambulatory Visit (HOSPITAL_BASED_OUTPATIENT_CLINIC_OR_DEPARTMENT_OTHER): Payer: Self-pay | Admitting: Internal Medicine

## 2020-06-05 DIAGNOSIS — Z23 Encounter for immunization: Secondary | ICD-10-CM

## 2020-06-05 NOTE — Progress Notes (Signed)
   Covid-19 Vaccination Clinic  Name:  Miranda Orozco    MRN: [id number] DOB: [birth year]  06/05/2020  [name] was observed post Covid-19 immunization for 15 minutes without incident. She was provided with Vaccine Information Sheet and instruction to access the V-Safe system.   [name] was instructed to call 911 with any severe reactions post vaccine: Marland Kitchen Difficulty breathing  . Swelling of face and throat  . A fast heartbeat  . A bad rash all over body  . Dizziness and weakness

## 2020-06-09 MED FILL — CARVEDILOL 6.25 MG TABLET: 6.25 | 30 days supply | Qty: 60 | Fill #1

## 2020-06-14 MED FILL — PFIZER-BIONTECH COVID-19 VA: 30 | 1 days supply | Qty: 0 | Fill #0

## 2020-07-14 MED FILL — CARVEDILOL 6.25 MG TABLET: 6.25 | 30 days supply | Qty: 60 | Fill #2

## 2020-08-30 ENCOUNTER — Other Ambulatory Visit: Payer: Self-pay | Admitting: Family Medicine

## 2020-08-30 MED FILL — ALPRAZolam 0.25 MG TABS: 0.25 | 10 days supply | Qty: 20 | Fill #1

## 2020-08-30 MED FILL — CARVEDILOL 6.25 MG TABLET: 6.25 | 30 days supply | Qty: 60 | Fill #0

## 2020-08-30 NOTE — Telephone Encounter (Signed)
Medication: carvedilol (COREG) 6.25 MG tablet    Has the patient contacted their pharmacy? No. (If no, request that the patient contact the pharmacy for the refill.) (If yes, when and what did the pharmacy advise?)  Preferred Pharmacy (with phone number or street name):  [place], [place] - [address]  [place], [place] [address]  Phone:  [phone] Fax:  [phone]  DEA #:  [id number]  [id number] Reason: --     Agent: Please be advised that RX refills may take up to 3 business days. We ask that you follow-up with your pharmacy.

## 2020-10-09 ENCOUNTER — Other Ambulatory Visit: Payer: Self-pay | Admitting: Family Medicine

## 2020-10-09 MED FILL — CARVEDILOL 6.25 MG TABLET: 6.25 | 30 days supply | Qty: 60 | Fill #1

## 2020-10-12 ENCOUNTER — Telehealth: Payer: Self-pay | Admitting: *Deleted

## 2020-10-12 ENCOUNTER — Other Ambulatory Visit (HOSPITAL_COMMUNITY): Payer: Self-pay | Admitting: Family Medicine

## 2020-10-12 ENCOUNTER — Other Ambulatory Visit: Payer: Self-pay | Admitting: Family Medicine

## 2020-10-12 MED ORDER — ALPRAZOLAM 0.25 MG PO TABS: ORAL_TABLET | ORAL | 1 refills | Status: DC

## 2020-10-12 NOTE — Telephone Encounter (Signed)
I will send her in a refill with 1 refill but she will need an appt before it runs out

## 2020-10-12 NOTE — Telephone Encounter (Signed)
Requesting: alprazolam  Contract: none UDS: none Last Visit: 05/01/20 Next Visit: none Last Refill: 04/25/20  Please Advise

## 2020-10-13 ENCOUNTER — Telehealth: Payer: Self-pay

## 2020-10-13 MED FILL — ALPRAZolam 0.25 MG TABS: 0.25 | 10 days supply | Qty: 20 | Fill #0

## 2020-10-13 NOTE — Telephone Encounter (Signed)
Attempted to call to schedule for medication check.

## 2020-10-13 NOTE — Telephone Encounter (Signed)
Attempted to call pt to get her scheduled

## 2020-10-18 NOTE — Telephone Encounter (Signed)
Attempted to call patient to get scheduled. LVM to return call. Can we try patient later to be scheduled.

## 2020-10-18 NOTE — Telephone Encounter (Signed)
Called s.w patient. She states she will call back to schedule an appt. I attempted to schedule her now for the future but she declined and stated she had a lot going on and when things calm down she will call back

## 2020-10-23 DIAGNOSIS — Z8279 Family history of other congenital malformations, deformations and chromosomal abnormalities: Secondary | ICD-10-CM | POA: Diagnosis not present

## 2020-11-07 ENCOUNTER — Other Ambulatory Visit (HOSPITAL_COMMUNITY): Payer: Self-pay

## 2020-11-09 ENCOUNTER — Ambulatory Visit: Payer: Self-pay

## 2020-11-10 ENCOUNTER — Other Ambulatory Visit: Payer: Self-pay

## 2020-11-10 ENCOUNTER — Ambulatory Visit (INDEPENDENT_AMBULATORY_CARE_PROVIDER_SITE_OTHER): Payer: 59

## 2020-11-10 ENCOUNTER — Ambulatory Visit: Payer: 59 | Attending: Internal Medicine

## 2020-11-10 DIAGNOSIS — Z23 Encounter for immunization: Secondary | ICD-10-CM

## 2020-11-10 NOTE — Progress Notes (Signed)
Patient came in today for a Tdap immunizations per Dr.Blyth .  Immunization given in Left deltoid IM , patient tolerated injection well.

## 2020-11-10 NOTE — Progress Notes (Signed)
   Covid-19 Vaccination Clinic  Name:  Meaghen Vecchiarelli    MRN: [id number] DOB: 1961-06-13  11/10/2020  Ms. Benko was observed post Covid-19 immunization for 15 minutes without incident. She was provided with Vaccine Information Sheet and instruction to access the V-Safe system.   Ms. Abts was instructed to call 911 with any severe reactions post vaccine: Marland Kitchen Difficulty breathing  . Swelling of face and throat  . A fast heartbeat  . A bad rash all over body  . Dizziness and weakness   Immunizations Administered    Name Date Dose VIS Date Route   PFIZER Comrnaty(Gray TOP) Covid-19 Vaccine 11/10/2020 10:54 AM 0.3 mL 07/13/2020 Intramuscular   Manufacturer: [place]   Lot: [id number]   [place]: [phone]

## 2020-11-16 ENCOUNTER — Other Ambulatory Visit (HOSPITAL_COMMUNITY): Payer: Self-pay

## 2020-11-16 MED FILL — Carvedilol Tab 6.25 MG: ORAL | 30 days supply | Qty: 60 | Fill #0 | Status: AC

## 2020-11-17 ENCOUNTER — Other Ambulatory Visit (HOSPITAL_BASED_OUTPATIENT_CLINIC_OR_DEPARTMENT_OTHER): Payer: Self-pay

## 2020-11-17 MED ORDER — PFIZER-BIONT COVID-19 VAC-TRIS 30 MCG/0.3ML IM SUSP
INTRAMUSCULAR | 5 refills | Status: DC
  Filled 2020-11-17: qty 0.3, 1d supply, fill #0

## 2020-12-20 ENCOUNTER — Other Ambulatory Visit: Payer: Self-pay

## 2020-12-20 ENCOUNTER — Other Ambulatory Visit (HOSPITAL_COMMUNITY): Payer: Self-pay

## 2020-12-20 MED ORDER — ALPRAZOLAM 0.25 MG PO TABS
ORAL_TABLET | ORAL | 1 refills | Status: DC
  Filled 2020-12-20: qty 20, 10d supply, fill #0
  Filled 2021-02-27: qty 20, 10d supply, fill #1

## 2020-12-20 NOTE — Telephone Encounter (Signed)
Requesting: alprazolam 0.25mg  Contract: None UDS: None Last Visit:05/01/2020 Next Visit: 12/26/2020 w/ Percell Miller Last Refill: 10/12/2020 #20 and 0RF  Please Advise

## 2020-12-26 ENCOUNTER — Encounter: Payer: Self-pay | Admitting: Medical

## 2020-12-26 ENCOUNTER — Other Ambulatory Visit (HOSPITAL_COMMUNITY): Payer: Self-pay

## 2020-12-26 ENCOUNTER — Ambulatory Visit: Payer: 59 | Admitting: Medical

## 2020-12-26 ENCOUNTER — Other Ambulatory Visit: Payer: Self-pay

## 2020-12-26 VITALS — BP 138/85 | HR 79 | Temp 98.2°F | Resp 18 | Ht 65.0 in | Wt 217.0 lb

## 2020-12-26 DIAGNOSIS — I10 Essential (primary) hypertension: Secondary | ICD-10-CM

## 2020-12-26 DIAGNOSIS — R21 Rash and other nonspecific skin eruption: Secondary | ICD-10-CM | POA: Diagnosis not present

## 2020-12-26 DIAGNOSIS — Z0000 Encounter for general adult medical examination without abnormal findings: Secondary | ICD-10-CM | POA: Diagnosis not present

## 2020-12-26 DIAGNOSIS — R739 Hyperglycemia, unspecified: Secondary | ICD-10-CM | POA: Diagnosis not present

## 2020-12-26 LAB — COMPREHENSIVE METABOLIC PANEL
ALT: 61 U/L — ABNORMAL HIGH (ref 0–35)
AST: 40 U/L — ABNORMAL HIGH (ref 0–37)
Albumin: 4.5 g/dL (ref 3.5–5.2)
Alkaline Phosphatase: 78 U/L (ref 39–117)
BUN: 12 mg/dL (ref 6–23)
CO2: 31 mEq/L (ref 19–32)
Calcium: 9.7 mg/dL (ref 8.4–10.5)
Chloride: 101 mEq/L (ref 96–112)
Creatinine, Ser: 0.77 mg/dL (ref 0.40–1.20)
GFR: 84.48 mL/min (ref >60.00)
Glucose, Bld: 157 mg/dL — ABNORMAL HIGH (ref 70–99)
Potassium: 4.4 mEq/L (ref 3.5–5.1)
Sodium: 140 mEq/L (ref 135–145)
Total Bilirubin: 0.5 mg/dL (ref 0.2–1.2)
Total Protein: 7.7 g/dL (ref 6.0–8.3)

## 2020-12-26 LAB — CBC WITH DIFFERENTIAL/PLATELET
Basophils Absolute: 0 10*3/uL (ref 0.0–0.1)
Basophils Relative: 0.4 % (ref 0.0–3.0)
Eosinophils Absolute: 0.3 10*3/uL (ref 0.0–0.7)
Eosinophils Relative: 3.9 % (ref 0.0–5.0)
HCT: 40.9 % (ref 36.0–46.0)
Hemoglobin: 14.1 g/dL (ref 12.0–15.0)
Lymphocytes Relative: 23.8 % (ref 12.0–46.0)
Lymphs Abs: 1.7 10*3/uL (ref 0.7–4.0)
MCHC: 34.5 g/dL (ref 30.0–36.0)
MCV: 92.9 fl (ref 78.0–100.0)
Monocytes Absolute: 0.6 10*3/uL (ref 0.1–1.0)
Monocytes Relative: 8.5 % (ref 3.0–12.0)
Neutro Abs: 4.6 10*3/uL (ref 1.4–7.7)
Neutrophils Relative %: 63.4 % (ref 43.0–77.0)
Platelets: 231 10*3/uL (ref 150.0–400.0)
RBC: 4.4 Mil/uL (ref 3.87–5.11)
RDW: 14.1 % (ref 11.5–15.5)
WBC: 7.3 10*3/uL (ref 4.0–10.5)

## 2020-12-26 LAB — LIPID PANEL
Cholesterol: 236 mg/dL — ABNORMAL HIGH (ref 0–200)
HDL: 43.3 mg/dL (ref >39.00)
NonHDL: 193.06
Total CHOL/HDL Ratio: 5
Triglycerides: 241 mg/dL — ABNORMAL HIGH (ref 0.0–149.0)
VLDL: 48.2 mg/dL — ABNORMAL HIGH (ref 0.0–40.0)

## 2020-12-26 LAB — HEMOGLOBIN A1C: Hgb A1c MFr Bld: 6.5 % (ref 4.6–6.5)

## 2020-12-26 LAB — LDL CHOLESTEROL, DIRECT: Direct LDL: 148 mg/dL

## 2020-12-26 MED ORDER — LOSARTAN POTASSIUM 25 MG PO TABS
25.0000 mg | ORAL_TABLET | Freq: Every day | ORAL | 1 refills | Status: DC
  Filled 2020-12-26: qty 90, 90d supply, fill #0

## 2020-12-26 MED ORDER — METFORMIN HCL 1000 MG PO TABS
1000.0000 mg | ORAL_TABLET | Freq: Two times a day (BID) | ORAL | 3 refills | Status: DC
  Filled 2020-12-26: qty 30, 15d supply, fill #0

## 2020-12-26 MED ORDER — ATORVASTATIN CALCIUM 10 MG PO TABS
10.0000 mg | ORAL_TABLET | Freq: Every day | ORAL | 3 refills | Status: DC
  Filled 2020-12-26: qty 30, 30d supply, fill #0

## 2020-12-26 MED ORDER — FAMCICLOVIR 500 MG PO TABS
500.0000 mg | ORAL_TABLET | Freq: Three times a day (TID) | ORAL | 0 refills | Status: DC
  Filled 2020-12-26: qty 21, 7d supply, fill #0

## 2020-12-26 NOTE — Addendum Note (Signed)
Addended by: Anabel Halon on: 12/26/2020 08:13 PM   Modules accepted: Orders

## 2020-12-26 NOTE — Progress Notes (Signed)
Subjective:    Patient ID: Miranda Orozco, female    DOB: [birth year], 59 y.o.   MRN: [id number]  HPI  Pt in for wellness exam. She has not had screening labs in a while.   Pt husband works for [employer]. Pt does not exercise regularly. States moderate healthy diet. Pt aware has to loose weight. Nonsmoker. 2 glasses of wine or week.  Pt had mammogram in dec 2021. Negtive finding per pt.  Pt sees gyn. Last pap may 2019.  Pt has history of elevated sugar. Prediabetic range.   History of anxiety. Pt has periodic severe anxiety related to taking care of child with disability ad mom with poor health. Xanax refilled last week.  Pt has bp cuff at home. When she checks her bp is higher. She give readings of 149/93 and 153/95. Pt is on coreg 6.26 twice daily. Pulse 83 and 78 that day.    Review of Systems  Constitutional: Negative for chills, fatigue and fever.  HENT: Negative for congestion and drooling.   Respiratory: Negative for cough, chest tightness, shortness of breath and wheezing.   Cardiovascular: Negative for chest pain and palpitations.  Gastrointestinal: Negative for abdominal pain.  Genitourinary: Negative for difficulty urinating and dysuria.  Musculoskeletal: Negative for back pain and myalgias.  Skin: Negative for rash.  Neurological: Negative for dizziness, speech difficulty, weakness, light-headedness, numbness and headaches.  Hematological: Negative for adenopathy. Does not bruise/bleed easily.  Psychiatric/Behavioral: Negative for behavioral problems and confusion.    Past Medical History:  Diagnosis Date  . Arthritis, rheumatic, acute or subacute 10/02/2009   RA  . BREAST CANCER, HX OF 10/02/2009  . Disorder of bone and cartilage 10/02/2009   Qualifier: Diagnosis of  By: [name] MD, [name]   . Esophageal reflux 10/02/2009  . Hives 09/18/2014  . MALIGNANT NEOPLASM OF BREAST UNSPECIFIED SITE 10/02/2009   RIGHT CHMOE AND RADIATION DONE  . Obesity   . OSTEOPENIA 10/02/2009      Social History   Socioeconomic History  . Marital status: Married    Spouse name: Not on file  . Number of children: Not on file  . Years of education: Not on file  . Highest education level: Not on file  Occupational History  . Not on file  Tobacco Use  . Smoking status: Former Smoker    Packs/day: 0.50    Years: 1.00    Pack years: 0.50    Types: Cigarettes    Quit date: 10/17/1981    Years since quitting: 39.2  . Smokeless tobacco: Never Used  Vaping Use  . Vaping Use: Never used  Substance and Sexual Activity  . Alcohol use: Yes    Alcohol/week: 2.0 - 3.0 standard drinks    Types: 2 - 3 Glasses of wine per week  . Drug use: No  . Sexual activity: Yes    Birth control/protection: None    Comment: lives with husband and children, no dietary restriction  Other Topics Concern  . Not on file  Social History Narrative  . Not on file   Social Determinants of Health   Financial Resource Strain: Not on file  Food Insecurity: Not on file  Transportation Needs: Not on file  Physical Activity: Not on file  Stress: Not on file  Social Connections: Not on file  Intimate Partner Violence: Not on file    Past Surgical History:  Procedure Laterality Date  . BREAST LUMPECTOMY  03/20/10   for margin  . BREAST LUMPECTOMY  03/26/2010   for margin  . CERVICAL CONIZATION W/BX Bilateral 02/06/2018   Procedure: CONIZATION CERVIX WITH BIOPSY;  Surgeon: Eldred Manges, MD;  Location: [facility];  Service: Gynecology;  Laterality: Bilateral;  . [place], 2001  . COLOMSCOPY  2018  . DILATATION & CURRETTAGE/HYSTEROSCOPY WITH RESECTOCOPE N/A 02/19/2013   Procedure: DILATATION & CURETTAGE/HYSTEROSCOPY WITH RESECTION OF POLYP;  Surgeon: Eldred Manges, MD;  Location: [facility];  Service: Gynecology;  Laterality: N/A;  . MASTECTOMY PARTIAL / LUMPECTOMY W/ AXILLARY LYMPHADENECTOMY  03/12/2010   Right w SL[facility] Dr Margot Chimes  . SIMPLE MASTECTOMY  03/31/2011    For margin    Family History  Problem Relation Age of Onset  . Hypertension Mother   . Hyperlipidemia Mother   . Hypertension Father   . Diabetes Father   . Cancer Father        lung cancer, smoker  . Arthritis Other   . Diabetes Other   . Hypertension Other   . Colon cancer Neg Hx   . Stomach cancer Neg Hx     No Known Allergies  Current Outpatient Medications on File Prior to Visit  Medication Sig Dispense Refill  . ALPRAZolam (XANAX) 0.25 MG tablet TAKE 1 TABLET BY MOUTH TWICE DAILY AS NEEDED FOR ANXIETY OR SLEEP. 20 tablet 1  . blood glucose meter kit and supplies Dispense based on patient and insurance preference. Use up to four times daily as directed. Dz:E11.9 Check BS TID prn 1 each 0  . carvedilol (COREG) 6.25 MG tablet TAKE 1 TABLET BY MOUTH 2 TIMES DAILY WITH A MEAL 60 tablet 2  . FREESTYLE LITE test strip USE TO TEST UP TO FOUR TIMES DAILY AS DIRECTED 100 strip 0  . glucose blood test strip Use as instructed. Dispense based on patient and insurance preference. Use up to four times daily as directed. Dz:E11.9  Check BS TID prn 100 each 12  . Lancets (FREESTYLE) lancets USE TO TEST UP TO FOUR TIMES DAILY AS DIRECTED 100 each 0  . Lancets MISC Dispense based on patient and insurance preference. Use up to four times daily as directed. Dz:E11.9  Check BS TID prn 100 each 12  . Multiple Vitamins-Minerals (MULTIVITAMIN WITH MINERALS) tablet Take 1 tablet by mouth daily.     No current facility-administered medications on file prior to visit.    BP 137/77   Pulse 79   Temp 98.2 F (36.8 C)   Resp 18   Ht _0  (1.651 m)   Wt 217 lb (98.4 kg)   SpO2 98%   BMI 36.11 kg/m       Objective:   Physical Exam  General Mental Status- Alert. General Appearance- Not in acute distress.   Skin Left upper chest. 3 small raised papule. One center area small scab.  Neck Carotid Arteries- Normal color. Moisture- Normal Moisture. No carotid bruits. No JVD.  Chest and  Lung Exam Auscultation: Breath Sounds:-Normal.  Cardiovascular Auscultation:Rythm- Regular. Murmurs & Other Heart Sounds:Auscultation of the heart reveals- No Murmurs.  Abdomen Inspection:-Inspeection Normal. Palpation/Percussion:Note:No mass. Palpation and Percussion of the abdomen reveal- Non Tender, Non Distended + BS, no rebound or guarding.   Neurologic Cranial Nerve exam:- CN III-XII intact(No nystagmus), symmetric smile. Strength:- 5/5 equal and symmetric strength both upper and lower extremities.      Assessment & Plan:  For you wellness exam today I have ordered cbc, cmp and  lipid panel.  Vaccine appear  up to date.  Recommend exercise and healthy diet.  We will let you know lab results as they come in.  Follow up date appointment will be determined after lab review.   For anxiety continue with periodic sparing use of xanax as needed.  For elevated sugar in past check a1c today.  For htn continue coreg and adding low dose losartan 25 mg daily.  Small rash left of midline upper chest. Can use benadryl topical twice daily. If area worsens/spreads with shingle like features as discussed then start famvir.   Mackie Pai, [place]   [id number] charge as did address anxiety, htn, rash and elevated sugar history.

## 2020-12-26 NOTE — Patient Instructions (Addendum)
For you wellness exam today I have ordered cbc, cmp and  lipid panel.  Vaccine appear up to date.  Recommend exercise and healthy diet.  We will let you know lab results as they come in.  Follow up date appointment will be determined after lab review.   For anxiety continue with periodic sparing use of xanax as needed.  For elevated sugar in past check a1c today.  For htn continue coreg and adding low dose losartan 25 mg daily.  Small rash left of midline upper chest. Can use benadryl topical twice daily. If area worsens/spreads with shingle like features as discussed then start famvir.    Preventive Care 66-72 Years Old, Female Preventive care refers to lifestyle choices and visits with your health care provider that can promote health and wellness. This includes:  A yearly physical exam. This is also called an annual wellness visit.  Regular dental and eye exams.  Immunizations.  Screening for certain conditions.  Healthy lifestyle choices, such as: ? Eating a healthy diet. ? Getting regular exercise. ? Not using drugs or products that contain nicotine and tobacco. ? Limiting alcohol use. What can I expect for my preventive care visit? Physical exam Your health care provider will check your:  Height and weight. These may be used to calculate your BMI (body mass index). BMI is a measurement that tells if you are at a healthy weight.  Heart rate and blood pressure.  Body temperature.  Skin for abnormal spots. Counseling Your health care provider may ask you questions about your:  Past medical problems.  Family's medical history.  Alcohol, tobacco, and drug use.  Emotional well-being.  Home life and relationship well-being.  Sexual activity.  Diet, exercise, and sleep habits.  Work and work Statistician.  Access to firearms.  Method of birth control.  Menstrual cycle.  Pregnancy history. What immunizations do I need? Vaccines are usually given  at various ages, according to a schedule. Your health care provider will recommend vaccines for you based on your age, medical history, and lifestyle or other factors, such as travel or where you work.   What tests do I need? Blood tests  Lipid and cholesterol levels. These may be checked every 5 years, or more often if you are over 37 years old.  Hepatitis C test.  Hepatitis B test. Screening  Lung cancer screening. You may have this screening every year starting at age 1 if you have a 30-pack-year history of smoking and currently smoke or have quit within the past 15 years.  Colorectal cancer screening. ? All adults should have this screening starting at age 51 and continuing until age 78. ? Your health care provider may recommend screening at age 37 if you are at increased risk. ? You will have tests every 1-10 years, depending on your results and the type of screening test.  Diabetes screening. ? This is done by checking your blood sugar (glucose) after you have not eaten for a while (fasting). ? You may have this done every 1-3 years.  Mammogram. ? This may be done every 1-2 years. ? Talk with your health care provider about when you should start having regular mammograms. This may depend on whether you have a family history of breast cancer.  BRCA-related cancer screening. This may be done if you have a family history of breast, ovarian, tubal, or peritoneal cancers.  Pelvic exam and Pap test. ? This may be done every 3 years starting at age 104. ?  Starting at age 26, this may be done every 5 years if you have a Pap test in combination with an HPV test. Other tests  STD (sexually transmitted disease) testing, if you are at risk.  Bone density scan. This is done to screen for osteoporosis. You may have this scan if you are at high risk for osteoporosis. Talk with your health care provider about your test results, treatment options, and if necessary, the need for more  tests. Follow these instructions at home: Eating and drinking  Eat a diet that includes fresh fruits and vegetables, whole grains, lean protein, and low-fat dairy products.  Take vitamin and mineral supplements as recommended by your health care provider.  Do not drink alcohol if: ? Your health care provider tells you not to drink. ? You are pregnant, may be pregnant, or are planning to become pregnant.  If you drink alcohol: ? Limit how much you have to 0-1 drink a day. ? Be aware of how much alcohol is in your drink. In the [place] one drink equals one 12 oz bottle of beer (355 mL), one 5 oz glass of wine (148 mL), or one 1 oz glass of hard liquor (44 mL).   Lifestyle  Take daily care of your teeth and gums. Brush your teeth every morning and night with fluoride toothpaste. Floss one time each day.  Stay active. Exercise for at least 30 minutes 5 or more days each week.  Do not use any products that contain nicotine or tobacco, such as cigarettes, e-cigarettes, and chewing tobacco. If you need help quitting, ask your health care provider.  Do not use drugs.  If you are sexually active, practice safe sex. Use a condom or other form of protection to prevent STIs (sexually transmitted infections).  If you do not wish to become pregnant, use a form of birth control. If you plan to become pregnant, see your health care provider for a prepregnancy visit.  If told by your health care provider, take low-dose aspirin daily starting at age 33.  Find healthy ways to cope with stress, such as: ? Meditation, yoga, or listening to music. ? Journaling. ? Talking to a trusted person. ? Spending time with friends and family. Safety  Always wear your seat belt while driving or riding in a vehicle.  Do not drive: ? If you have been drinking alcohol. Do not ride with someone who has been drinking. ? When you are tired or distracted. ? While texting.  Wear a helmet and other protective  equipment during sports activities.  If you have firearms in your house, make sure you follow all gun safety procedures. What's next?  Visit your health care provider once a year for an annual wellness visit.  Ask your health care provider how often you should have your eyes and teeth checked.  Stay up to date on all vaccines. This information is not intended to replace advice given to you by your health care provider. Make sure you discuss any questions you have with your health care provider. Document Revised: 04/25/2020 Document Reviewed: 04/02/2018 Elsevier Patient Education  2021 [employer].

## 2020-12-27 ENCOUNTER — Other Ambulatory Visit (HOSPITAL_COMMUNITY): Payer: Self-pay

## 2020-12-27 ENCOUNTER — Encounter: Payer: Self-pay | Admitting: Medical

## 2020-12-27 ENCOUNTER — Other Ambulatory Visit: Payer: Self-pay | Admitting: Family Medicine

## 2020-12-27 MED ORDER — BLOOD GLUCOSE METER KIT
PACK | 0 refills | Status: DC
  Filled 2020-12-27 (×2): qty 1, 30d supply, fill #0

## 2020-12-27 MED ORDER — FREESTYLE LITE TEST VI STRP
ORAL_STRIP | 0 refills | Status: DC
  Filled 2020-12-27: qty 100, 33d supply, fill #0
  Filled 2020-12-27: qty 100, 30d supply, fill #0

## 2020-12-27 MED ORDER — FREESTYLE LANCETS MISC
0 refills | Status: DC
  Filled 2020-12-27: qty 100, 33d supply, fill #0
  Filled 2020-12-27: qty 100, 30d supply, fill #0

## 2020-12-27 MED ORDER — CARVEDILOL 6.25 MG PO TABS
6.2500 mg | ORAL_TABLET | Freq: Two times a day (BID) | ORAL | 1 refills | Status: DC
  Filled 2020-12-27: qty 180, 90d supply, fill #0
  Filled 2021-02-06: qty 180, 90d supply, fill #1

## 2021-01-03 ENCOUNTER — Encounter: Payer: Self-pay | Admitting: Medical

## 2021-01-30 DIAGNOSIS — H1045 Other chronic allergic conjunctivitis: Secondary | ICD-10-CM | POA: Diagnosis not present

## 2021-02-06 ENCOUNTER — Telehealth: Payer: Self-pay | Admitting: *Deleted

## 2021-02-06 ENCOUNTER — Other Ambulatory Visit (HOSPITAL_COMMUNITY): Payer: Self-pay

## 2021-02-06 MED ORDER — CARVEDILOL 6.25 MG PO TABS
6.2500 mg | ORAL_TABLET | Freq: Two times a day (BID) | ORAL | 0 refills | Status: DC
Start: 2021-02-06 — End: 2021-05-01

## 2021-02-06 MED ORDER — METFORMIN HCL 1000 MG PO TABS: 1000.0000 mg | ORAL_TABLET | Freq: Two times a day (BID) | ORAL | 0 refills | Status: DC

## 2021-02-06 MED ORDER — LOSARTAN POTASSIUM 25 MG PO TABS: 25.0000 mg | ORAL_TABLET | Freq: Every day | ORAL | 0 refills | Status: DC

## 2021-02-06 MED ORDER — ATORVASTATIN CALCIUM 10 MG PO TABS: 10.0000 mg | ORAL_TABLET | Freq: Every day | ORAL | 0 refills | Status: DC

## 2021-02-06 NOTE — Telephone Encounter (Signed)
Spoke with patient and she stated that she had ran out of medication on Thursday.  She called the after hours line on 02/04/21 to let them know that she needed a prescription called into like a [facility], because [facility] pharmacy was closed til Tuesday (which is [facility]).  The nurse line advised her that they would not be able to send her medication into another pharmacy because protocol.  They advised her to go to an urgent care to get a prescription.  Patient stated that she did not want to go to a place with sick people (possibly catch covid).  So she then spoke with the supervisor who advised her to do an E-Visit ( they do not do bp issues).    I advised her that she will just need to call her pharmacy now to request since she has refills.  Also advised that insurance may not cover, which she was ok with that.  I also called in one [facility]ill for [facility] her maintenance meds incase this may happen again.  Also this was reported to my supervisor.  Patient was very [employer] attorney.

## 2021-02-06 NOTE — Telephone Encounter (Signed)
Who Is Calling Patient / Member / Family / Caregiver Call Type Triage / Clinical Relationship To Patient Self Return Phone Number [phone]) [phone] (Primary) Chief Complaint Prescription Refill or Medication Request (non symptomatic) Reason for Call Symptomatic / Request for Health Information Initial Comment [name] states she lost her medication she is needing and is wanting her blood pressure medication called in. Translation No Nurse Assessment Nurse: Louie Boston, RN, Barnetta Chapel Date/Time (Eastern Time): 02/04/2021 4:36:59 PM Confirm and document reason for call. If symptomatic, describe symptoms. ---caller states she lost her medication. lost a belongings bag at the hospital while daughter was giving birth. carvedilol 6.25mg  bid last refilled may 25th. bp high 138/104 last night. today 128/102

## 2021-02-27 ENCOUNTER — Other Ambulatory Visit (HOSPITAL_COMMUNITY): Payer: Self-pay

## 2021-04-10 DIAGNOSIS — E119 Type 2 diabetes mellitus without complications: Secondary | ICD-10-CM | POA: Diagnosis not present

## 2021-04-10 LAB — HM DIABETES EYE EXAM

## 2021-04-16 ENCOUNTER — Other Ambulatory Visit: Payer: Self-pay | Admitting: Family Medicine

## 2021-04-16 MED ORDER — ALPRAZOLAM 0.25 MG PO TABS
ORAL_TABLET | ORAL | 1 refills | Status: DC
  Filled 2021-04-16: qty 20, 10d supply, fill #0
  Filled 2021-06-10: qty 20, 10d supply, fill #1

## 2021-04-16 NOTE — Telephone Encounter (Signed)
Requesting: alprazolam 0.'25mg'$  Contract: None UDS: None Last Visit: 12/26/2020 w/ Percell Miller Next Visit: 05/01/2021 Last Refill: 12/20/20 #20 and 0RF  Please Advise

## 2021-04-17 ENCOUNTER — Other Ambulatory Visit (HOSPITAL_COMMUNITY): Payer: Self-pay

## 2021-05-01 ENCOUNTER — Encounter: Payer: Self-pay | Admitting: Family Medicine

## 2021-05-01 ENCOUNTER — Ambulatory Visit: Payer: 59 | Admitting: Family Medicine

## 2021-05-01 ENCOUNTER — Telehealth: Payer: Self-pay | Admitting: Family Medicine

## 2021-05-01 ENCOUNTER — Encounter: Payer: Self-pay | Admitting: *Deleted

## 2021-05-01 ENCOUNTER — Other Ambulatory Visit: Payer: Self-pay

## 2021-05-01 VITALS — BP 152/94 | HR 87 | Temp 98.6°F | Resp 16 | Wt 213.0 lb

## 2021-05-01 DIAGNOSIS — C50411 Malignant neoplasm of upper-outer quadrant of right female breast: Secondary | ICD-10-CM | POA: Diagnosis not present

## 2021-05-01 DIAGNOSIS — I10 Essential (primary) hypertension: Secondary | ICD-10-CM | POA: Diagnosis not present

## 2021-05-01 DIAGNOSIS — Z79899 Other long term (current) drug therapy: Secondary | ICD-10-CM | POA: Diagnosis not present

## 2021-05-01 DIAGNOSIS — R739 Hyperglycemia, unspecified: Secondary | ICD-10-CM | POA: Diagnosis not present

## 2021-05-01 DIAGNOSIS — M069 Rheumatoid arthritis, unspecified: Secondary | ICD-10-CM

## 2021-05-01 DIAGNOSIS — Z170 Estrogen receptor positive status [ER+]: Secondary | ICD-10-CM

## 2021-05-01 DIAGNOSIS — R0789 Other chest pain: Secondary | ICD-10-CM

## 2021-05-01 DIAGNOSIS — Z23 Encounter for immunization: Secondary | ICD-10-CM

## 2021-05-01 DIAGNOSIS — F419 Anxiety disorder, unspecified: Secondary | ICD-10-CM | POA: Diagnosis not present

## 2021-05-01 DIAGNOSIS — E669 Obesity, unspecified: Secondary | ICD-10-CM

## 2021-05-01 DIAGNOSIS — E782 Mixed hyperlipidemia: Secondary | ICD-10-CM | POA: Diagnosis not present

## 2021-05-01 DIAGNOSIS — Z124 Encounter for screening for malignant neoplasm of cervix: Secondary | ICD-10-CM

## 2021-05-01 LAB — CBC
HCT: 41.7 % (ref 36.0–46.0)
Hemoglobin: 13.8 g/dL (ref 12.0–15.0)
MCHC: 33.1 g/dL (ref 30.0–36.0)
MCV: 93.9 fl (ref 78.0–100.0)
Platelets: 251 10*3/uL (ref 150.0–400.0)
RBC: 4.44 Mil/uL (ref 3.87–5.11)
RDW: 14.6 % (ref 11.5–15.5)
WBC: 6.9 10*3/uL (ref 4.0–10.5)

## 2021-05-01 LAB — COMPREHENSIVE METABOLIC PANEL
ALT: 60 U/L — ABNORMAL HIGH (ref 0–35)
AST: 36 U/L (ref 0–37)
Albumin: 4.5 g/dL (ref 3.5–5.2)
Alkaline Phosphatase: 85 U/L (ref 39–117)
BUN: 16 mg/dL (ref 6–23)
CO2: 29 mEq/L (ref 19–32)
Calcium: 9.7 mg/dL (ref 8.4–10.5)
Chloride: 97 mEq/L (ref 96–112)
Creatinine, Ser: 0.75 mg/dL (ref 0.40–1.20)
GFR: 86.98 mL/min (ref >60.00)
Glucose, Bld: 103 mg/dL — ABNORMAL HIGH (ref 70–99)
Potassium: 4.1 mEq/L (ref 3.5–5.1)
Sodium: 137 mEq/L (ref 135–145)
Total Bilirubin: 0.4 mg/dL (ref 0.2–1.2)
Total Protein: 7.9 g/dL (ref 6.0–8.3)

## 2021-05-01 LAB — LIPID PANEL
Cholesterol: 253 mg/dL — ABNORMAL HIGH (ref 0–200)
HDL: 43.6 mg/dL (ref >39.00)
NonHDL: 209.07
Total CHOL/HDL Ratio: 6
Triglycerides: 363 mg/dL — ABNORMAL HIGH (ref 0.0–149.0)
VLDL: 72.6 mg/dL — ABNORMAL HIGH (ref 0.0–40.0)

## 2021-05-01 LAB — HEMOGLOBIN A1C: Hgb A1c MFr Bld: 6.3 % (ref 4.6–6.5)

## 2021-05-01 LAB — LDL CHOLESTEROL, DIRECT: Direct LDL: 139 mg/dL

## 2021-05-01 LAB — TSH: TSH: 5.76 u[IU]/mL — ABNORMAL HIGH (ref 0.35–5.50)

## 2021-05-01 NOTE — Progress Notes (Signed)
Patient ID: Miranda Orozco, female    DOB: 1961-01-10  Age: 60 y.o. MRN: [id number]    Subjective:   Chief Complaint  Patient presents with   Follow-up   Subjective   HPI Miranda Orozco presents for office visit today for follow up on HTN and hyperlipidemia. She is concerned about her recent BP readings of 192/125 and 167/95, however today it was 128/80. She is only prompted to check her BP when she is experiencing stressful events. It might have been due to the stress she has been going through with taking care of her 87 y/o mother and her father recently passed away. Denies CP/palp/SOB/HA/congestion/fevers/GI or GU c/o. Taking meds as prescribed.  6 weeks ago she experienced heaviness in chest, shoulders, and neck. It lasts for 3-4 minutes per episode. Episodes has happened to her 3-4 times over several years. She is worried they might be side effects from previous radiation treatment she received on right breast. She has residual cramps/spasms on right side usually when laughing   Review of Systems  Constitutional:  Negative for chills, fatigue and fever.  HENT:  Negative for congestion, rhinorrhea, sinus pressure, sinus pain and sore throat.   Eyes:  Negative for pain.  Respiratory:  Negative for cough and shortness of breath.   Cardiovascular:  Negative for chest pain, palpitations and leg swelling.  Gastrointestinal:  Negative for abdominal pain, blood in stool, diarrhea, nausea and vomiting.  Genitourinary:  Negative for decreased urine volume, flank pain, frequency, vaginal bleeding and vaginal discharge.  Musculoskeletal:  Negative for back pain.  Neurological:  Negative for headaches.   History Past Medical History:  Diagnosis Date   Arthritis, rheumatic, acute or subacute 10/02/2009   RA   BREAST CANCER, HX OF 10/02/2009   Disorder of bone and cartilage 10/02/2009   Qualifier: Diagnosis of  By: [name] MD, John E    Esophageal reflux 10/02/2009   Hives 09/18/2014   MALIGNANT  NEOPLASM OF BREAST UNSPECIFIED SITE 10/02/2009   RIGHT CHMOE AND RADIATION DONE   Obesity    OSTEOPENIA 10/02/2009    She has a past surgical history that includes Cesarean section; Mastectomy partial / lumpectomy w/ axillary lymphadenectomy (03/12/2010); Simple mastectomy (03/31/2011); Dilatation & currettage/hysteroscopy with resectoscope (N/A, 02/19/2013); Breast lumpectomy (03/20/10); Breast lumpectomy (03/26/2010); COLOMSCOPY (2018); and Cervical conization w/bx (Bilateral, 02/06/2018).     Current Outpatient Medications on File Prior to Visit  Medication Sig Dispense Refill   ALPRAZolam (XANAX) 0.25 MG tablet TAKE 1 TABLET BY MOUTH TWICE DAILY AS NEEDED FOR ANXIETY OR SLEEP. 20 tablet 1   blood glucose meter kit and supplies Use to check blood sugars three times daily 1 each 0   carvedilol (COREG) 6.25 MG tablet Take 1 tablet (6.25 mg total) by mouth 2 (two) times daily with a meal. 180 tablet 1   famciclovir (FAMVIR) 500 MG tablet Take 1 tablet (500 mg total) by mouth 3 (three) times daily. 21 tablet 0   FREESTYLE LITE test strip USE TO TEST UP TO FOUR TIMES DAILY AS DIRECTED 100 strip 0   glucose blood (FREESTYLE LITE) test strip Use to check blood sugar 3 times a day 100 each 0   glucose blood test strip Use as instructed. Dispense based on patient and insurance preference. Use up to four times daily as directed. Dz:E11.9  Check BS TID prn 100 each 12   Lancets (FREESTYLE) lancets USE TO TEST UP TO FOUR TIMES DAILY AS DIRECTED 100 each 0   Lancets (FREESTYLE)  lancets Use to check blood sugar 3 times a day 100 each 0   Lancets MISC Dispense based on patient and insurance preference. Use up to four times daily as directed. Dz:E11.9  Check BS TID prn 100 each 12   losartan (COZAAR) 25 MG tablet Take 1 tablet (25 mg total) by mouth daily. 30 tablet 0   metFORMIN (GLUCOPHAGE) 1000 MG tablet Take 1 tablet (1,000 mg total) by mouth 2 (two) times daily with a meal. 30 tablet 0   Multiple  Vitamins-Minerals (MULTIVITAMIN WITH MINERALS) tablet Take 1 tablet by mouth daily.     No current facility-administered medications on file prior to visit.     Objective:  Objective  Physical Exam Constitutional:      General: She is not in acute distress.    Appearance: Normal appearance. She is not ill-appearing or toxic-appearing.  HENT:     Head: Normocephalic and atraumatic.     Right Ear: Tympanic membrane, ear canal and external ear normal.     Left Ear: Tympanic membrane, ear canal and external ear normal.     Nose: No congestion or rhinorrhea.  Eyes:     Extraocular Movements: Extraocular movements intact.     Pupils: Pupils are equal, round, and reactive to light.  Cardiovascular:     Rate and Rhythm: Normal rate and regular rhythm.     Pulses: Normal pulses.     Heart sounds: Normal heart sounds. No murmur heard. Pulmonary:     Effort: Pulmonary effort is normal. No respiratory distress.     Breath sounds: Normal breath sounds. No wheezing, rhonchi or rales.  Abdominal:     General: Bowel sounds are normal.     Palpations: Abdomen is soft. There is no mass.     Tenderness: There is no abdominal tenderness. There is no guarding.     Hernia: No hernia is present.  Musculoskeletal:        General: Normal range of motion.     Cervical back: Normal range of motion and neck supple.  Skin:    General: Skin is warm and dry.  Neurological:     Mental Status: She is alert and oriented to person, place, and time.  Psychiatric:        Behavior: Behavior normal.   BP (!) 152/94   Pulse 87   Temp 98.6 F (37 C)   Resp 16   Wt 213 lb (96.6 kg)   SpO2 99%   BMI 35.45 kg/m  Wt Readings from Last 3 Encounters:  05/01/21 213 lb (96.6 kg)  12/26/20 217 lb (98.4 kg)  05/01/20 202 lb (91.6 kg)     Lab Results  Component Value Date   WBC 6.9 05/01/2021   HGB 13.8 05/01/2021   HCT 41.7 05/01/2021   PLT 251.0 05/01/2021   GLUCOSE 103 (H) 05/01/2021   CHOL 253 (H)  05/01/2021   TRIG 363.0 (H) 05/01/2021   HDL 43.60 05/01/2021   LDLDIRECT 139.0 05/01/2021   ALT 60 (H) 05/01/2021   AST 36 05/01/2021   NA 137 05/01/2021   K 4.1 05/01/2021   CL 97 05/01/2021   CREATININE 0.75 05/01/2021   BUN 16 05/01/2021   CO2 29 05/01/2021   TSH 5.76 (H) 05/01/2021   HGBA1C 6.3 05/01/2021    No results found.   Assessment & Plan:  Plan    Meds ordered this encounter  Medications   atorvastatin (LIPITOR) 20 MG tablet    Sig: Take 1  tablet (20 mg total) by mouth at bedtime.    Dispense:  30 tablet    Refill:  4     Problem List Items Addressed This Visit     Obesity (BMI 30.0-34.9)    Encouraged DASH or MIND diet, decrease po intake and increase exercise as tolerated. Needs 7-8 hours of sleep nightly. Avoid trans fats, eat small, frequent meals every 4-5 hours with lean proteins, complex carbs and healthy fats. Minimize simple carbs, high fat foods and processed foods      Malignant neoplasm of upper-outer quadrant of right breast in female, estrogen receptor positive ([place])   Relevant Orders   Ambulatory referral to Cardiology   Hyperlipidemia, mixed    Tolerating statin, encouraged heart healthy diet, avoid trans fats, minimize simple carbs and saturated fats. Increase exercise as tolerated      Relevant Medications   atorvastatin (LIPITOR) 20 MG tablet   Other Relevant Orders   Ambulatory referral to Cardiology   Lipid panel (Completed)   Rheumatoid arthritis ([place])   Relevant Orders   Ambulatory referral to Cardiology   Pressure in chest    Patient noting episodes of chest pressure and a sense of feeling hot in upper chest and upper back at times. With risk factors will refer to cardiology for further evaluation and she will seek care if pain recurs and does not remit      Relevant Orders   Ambulatory referral to Cardiology   Anxiety - Primary    Still under tremendous stress caring for her elderly mother, her special needs adult child  by herself a great deal of the time. Using meds, Alprazolam prn with adequate results and infrequently. No change today      Relevant Orders   Drug Monitoring Panel [phone] , Urine   Ambulatory referral to Cardiology   Hyperglycemia   Relevant Orders   Ambulatory referral to Cardiology   Hemoglobin A1c (Completed)   Hypertension    Initially here wnl but at home has been running 093A systolic and has even seen a few numbers up to 200, when rechecked she is 152/94. She has not been taking her BP meds consistently. She commits to doing so and if she remains high she will send [place] numbers and we will adjust Carvedilol dosing.       Relevant Medications   atorvastatin (LIPITOR) 20 MG tablet   Other Relevant Orders   Ambulatory referral to Cardiology   CBC (Completed)   Comprehensive metabolic panel (Completed)   TSH (Completed)   Cervical cancer screening    Her OB/GYN Dr Leo Grosser retired years ago and she has never established with a new provider. She agrees to new referral for ongoing care.       Relevant Orders   Ambulatory referral to Obstetrics / Gynecology   Other Visit Diagnoses     High risk medication use       Relevant Orders   Drug Monitoring Panel [phone] , Urine   Influenza vaccine administered       Relevant Orders   Flu Vaccine QUAD 36+ mos IM (Fluarix, Fluzone & Afluria Quad PF (Completed)       Follow-up: Return in about 3 months (around 08/09/2021).  I, Suezanne Jacquet, acting as a scribe for Penni Homans, MD, have documented all relevent documentation on behalf of Penni Homans, MD, as directed by Penni Homans, MD while in the presence of Penni Homans, MD. DO:05/04/21.  I, Mosie Lukes, MD personally performed the  services described in this documentation. All medical record entries made by the scribe were at my direction and in my presence. I have reviewed the chart and agree that the record reflects my personal performance and is accurate and complete

## 2021-05-01 NOTE — Telephone Encounter (Signed)
Assistant called from Dr.M Sabra Heck office at [facility] and stated they weren't able to access pt chart due to referral being placed as outage when Dr. Hale Bogus is considered internal due to being with [facility]. She was only able to find it because of the fax. She wants to see can it be changed to internal in the workque so they can access pts. Records.   She also wanted to make yall aware that Dr. Sabra Heck can be placed as internal for future referrals.

## 2021-05-01 NOTE — Patient Instructions (Signed)
Paxlovid or Molnupiravir is the new COVID medication we can give you if you get COVID so make sure you test if you have symptoms because we have to treat by day 5 of symptoms for it to be effective. If you are positive let us know so we can treat. If a home test is negative and your symptoms are persistent get a PCR test. Can check testing locations at [email or web address] If you are positive we will make an appointment with [place] and we will send in Paxlovid or Molnupiravir if you would like it. Check with your pharmacy before we meet to confirm they have it in stock, if they do not then we can get the prescription at the [facility]   Hypertension, Adult High blood pressure (hypertension) is when the force of blood pumping through the arteries is too strong. The arteries are the blood vessels that carry blood from the heart throughout the body. Hypertension forces the heart to work harder to pump blood and may cause arteries to become narrow or stiff. Untreated or uncontrolled hypertension can cause a heart attack, heart failure, a stroke, kidney disease, and other problems. A blood pressure reading consists of a higher number over a lower number. Ideally, your blood pressure should be below 120/80. The first ("top") number is called the systolic pressure. It is a measure of the pressure in your arteries as your heart beats. The second ("bottom") number is called the diastolic pressure. It is a measure of the pressure in your arteries as the heart relaxes. What are the causes? The exact cause of this condition is not known. There are some conditions that result in or are related to high blood pressure. What increases the risk? Some risk factors for high blood pressure are under your control. The following factors may make you more likely to develop this condition: Smoking. Having type 2 diabetes mellitus, high cholesterol, or both. Not getting enough exercise or physical activity. Being  overweight. Having too much fat, sugar, calories, or salt (sodium) in your diet. Drinking too much alcohol. Some risk factors for high blood pressure may be difficult or impossible to change. Some of these factors include: Having chronic kidney disease. Having a family history of high blood pressure. Age. Risk increases with age. Race. You may be at higher risk if you are African American. Gender. Men are at higher risk than women before age 63. After age 60, women are at higher risk than men. Having obstructive sleep apnea. Stress. What are the signs or symptoms? High blood pressure may not cause symptoms. Very high blood pressure (hypertensive crisis) may cause: Headache. Anxiety. Shortness of breath. Nosebleed. Nausea and vomiting. Vision changes. Severe chest pain. Seizures. How is this diagnosed? This condition is diagnosed by measuring your blood pressure while you are seated, with your arm resting on a flat surface, your legs uncrossed, and your feet flat on the floor. The cuff of the blood pressure monitor will be placed directly against the skin of your upper arm at the level of your heart. It should be measured at least twice using the same arm. Certain conditions can cause a difference in blood pressure between your right and left arms. Certain factors can cause blood pressure readings to be lower or higher than normal for a short period of time: When your blood pressure is higher when you are in a health care provider's office than when you are at home, this is called white coat hypertension. Most people  with this condition do not need medicines. When your blood pressure is higher at home than when you are in a health care provider's office, this is called masked hypertension. Most people with this condition may need medicines to control blood pressure. If you have a high blood pressure reading during one visit or you have normal blood pressure with other risk factors, you may  be asked to: Return on a different day to have your blood pressure checked again. Monitor your blood pressure at home for 1 week or longer. If you are diagnosed with hypertension, you may have other blood or imaging tests to help your health care provider understand your overall risk for other conditions. How is this treated? This condition is treated by making healthy lifestyle changes, such as eating healthy foods, exercising more, and reducing your alcohol intake. Your health care provider may prescribe medicine if lifestyle changes are not enough to get your blood pressure under control, and if: Your systolic blood pressure is above 130. Your diastolic blood pressure is above 80. Your personal target blood pressure may vary depending on your medical conditions, your age, and other factors. Follow these instructions at home: Eating and drinking  Eat a diet that is high in fiber and potassium, and low in sodium, added sugar, and fat. An example eating plan is called the DASH (Dietary Approaches to Stop Hypertension) diet. To eat this way: Eat plenty of fresh fruits and vegetables. Try to fill one half of your plate at each meal with fruits and vegetables. Eat whole grains, such as whole-wheat pasta, brown rice, or whole-grain bread. Fill about one fourth of your plate with whole grains. Eat or drink low-fat dairy products, such as skim milk or low-fat yogurt. Avoid fatty cuts of meat, processed or cured meats, and poultry with skin. Fill about one fourth of your plate with lean proteins, such as fish, chicken without skin, beans, eggs, or tofu. Avoid pre-made and processed foods. These tend to be higher in sodium, added sugar, and fat. Reduce your daily sodium intake. Most people with hypertension should eat less than 1,500 mg of sodium a day. Do not drink alcohol if: Your health care provider tells you not to drink. You are pregnant, may be pregnant, or are planning to become pregnant. If  you drink alcohol: Limit how much you use to: 0-1 drink a day for women. 0-2 drinks a day for men. Be aware of how much alcohol is in your drink. In the [place] one drink equals one 12 oz bottle of beer (355 mL), one 5 oz glass of wine (148 mL), or one 1 oz glass of hard liquor (44 mL). Lifestyle  Work with your health care provider to maintain a healthy body weight or to lose weight. Ask what an ideal weight is for you. Get at least 30 minutes of exercise most days of the week. Activities may include walking, swimming, or biking. Include exercise to strengthen your muscles (resistance exercise), such as Pilates or lifting weights, as part of your weekly exercise routine. Try to do these types of exercises for 30 minutes at least 3 days a week. Do not use any products that contain nicotine or tobacco, such as cigarettes, e-cigarettes, and chewing tobacco. If you need help quitting, ask your health care provider. Monitor your blood pressure at home as told by your health care provider. Keep all follow-up visits as told by your health care provider. This is important. Medicines Take over-the-counter and prescription medicines  only as told by your health care provider. Follow directions carefully. Blood pressure medicines must be taken as prescribed. Do not skip doses of blood pressure medicine. Doing this puts you at risk for problems and can make the medicine less effective. Ask your health care provider about side effects or reactions to medicines that you should watch for. Contact a health care provider if you: Think you are having a reaction to a medicine you are taking. Have headaches that keep coming back (recurring). Feel dizzy. Have swelling in your ankles. Have trouble with your vision. Get help right away if you: Develop a severe headache or confusion. Have unusual weakness or numbness. Feel faint. Have severe pain in your chest or abdomen. Vomit repeatedly. Have trouble  breathing. Summary Hypertension is when the force of blood pumping through your arteries is too strong. If this condition is not controlled, it may put you at risk for serious complications. Your personal target blood pressure may vary depending on your medical conditions, your age, and other factors. For most people, a normal blood pressure is less than 120/80. Hypertension is treated with lifestyle changes, medicines, or a combination of both. Lifestyle changes include losing weight, eating a healthy, low-sodium diet, exercising more, and limiting alcohol. This information is not intended to replace advice given to you by your health care provider. Make sure you discuss any questions you have with your health care provider. Document Revised: 04/01/2018 Document Reviewed: 04/01/2018 Elsevier Patient Education  [place].

## 2021-05-02 ENCOUNTER — Other Ambulatory Visit (HOSPITAL_COMMUNITY): Payer: Self-pay

## 2021-05-02 ENCOUNTER — Other Ambulatory Visit (INDEPENDENT_AMBULATORY_CARE_PROVIDER_SITE_OTHER): Payer: 59

## 2021-05-02 DIAGNOSIS — R7989 Other specified abnormal findings of blood chemistry: Secondary | ICD-10-CM

## 2021-05-02 LAB — T4, FREE: Free T4: 0.78 ng/dL (ref 0.60–1.60)

## 2021-05-02 MED ORDER — ATORVASTATIN CALCIUM 20 MG PO TABS
20.0000 mg | ORAL_TABLET | Freq: Every day | ORAL | 4 refills | Status: DC
  Filled 2021-05-02: qty 30, 30d supply, fill #0
  Filled 2021-09-26: qty 30, 30d supply, fill #1

## 2021-05-03 ENCOUNTER — Other Ambulatory Visit: Payer: Self-pay | Admitting: Oncology

## 2021-05-04 ENCOUNTER — Other Ambulatory Visit: Payer: Self-pay | Admitting: Oncology

## 2021-05-04 DIAGNOSIS — Z124 Encounter for screening for malignant neoplasm of cervix: Secondary | ICD-10-CM | POA: Insufficient documentation

## 2021-05-04 NOTE — Assessment & Plan Note (Addendum)
Initially here wnl but at home has been running 465K systolic and has even seen a few numbers up to 200, when rechecked she is 152/94. She has not been taking her BP meds consistently. She commits to doing so and if she remains high she will send [place] numbers and we will adjust Carvedilol dosing.

## 2021-05-04 NOTE — Assessment & Plan Note (Signed)
Encouraged DASH or MIND diet, decrease po intake and increase exercise as tolerated. Needs 7-8 hours of sleep nightly. Avoid trans fats, eat small, frequent meals every 4-5 hours with lean proteins, complex carbs and healthy fats. Minimize simple carbs, high fat foods and processed foods 

## 2021-05-04 NOTE — Assessment & Plan Note (Signed)
Tolerating statin, encouraged heart healthy diet, avoid trans fats, minimize simple carbs and saturated fats. Increase exercise as tolerated 

## 2021-05-04 NOTE — Assessment & Plan Note (Signed)
Her OB/GYN Dr Leo Grosser retired years ago and she has never established with a new provider. She agrees to new referral for ongoing care.

## 2021-05-04 NOTE — Assessment & Plan Note (Signed)
Still under tremendous stress caring for her elderly mother, her special needs adult child by herself a great deal of the time. Using meds, Alprazolam prn with adequate results and infrequently. No change today

## 2021-05-04 NOTE — Assessment & Plan Note (Signed)
Patient noting episodes of chest pressure and a sense of feeling hot in upper chest and upper back at times. With risk factors will refer to cardiology for further evaluation and she will seek care if pain recurs and does not remit

## 2021-05-05 LAB — DRUG MONITORING PANEL 376104, URINE

## 2021-05-05 LAB — DM TEMPLATE

## 2021-05-10 ENCOUNTER — Other Ambulatory Visit: Payer: Self-pay

## 2021-05-10 DIAGNOSIS — C50419 Malignant neoplasm of upper-outer quadrant of unspecified female breast: Secondary | ICD-10-CM

## 2021-05-18 ENCOUNTER — Telehealth: Payer: Self-pay | Admitting: Family Medicine

## 2021-05-18 NOTE — Telephone Encounter (Signed)
Pt stated she received a MyChart message informing her "new test results". Advised Pt last labs were 9.28, test results was reviewed and provided. Pt would like to speak with someone to go over concern. Please advise.

## 2021-06-06 ENCOUNTER — Ambulatory Visit (HOSPITAL_BASED_OUTPATIENT_CLINIC_OR_DEPARTMENT_OTHER): Payer: 59 | Admitting: Obstetrics & Gynecology

## 2021-06-11 ENCOUNTER — Other Ambulatory Visit: Payer: Self-pay | Admitting: Family Medicine

## 2021-06-11 ENCOUNTER — Other Ambulatory Visit (HOSPITAL_COMMUNITY): Payer: Self-pay

## 2021-06-11 DIAGNOSIS — C50919 Malignant neoplasm of unspecified site of unspecified female breast: Secondary | ICD-10-CM | POA: Insufficient documentation

## 2021-06-11 DIAGNOSIS — D249 Benign neoplasm of unspecified breast: Secondary | ICD-10-CM | POA: Insufficient documentation

## 2021-06-11 DIAGNOSIS — N83209 Unspecified ovarian cyst, unspecified side: Secondary | ICD-10-CM | POA: Insufficient documentation

## 2021-06-11 DIAGNOSIS — M8580 Other specified disorders of bone density and structure, unspecified site: Secondary | ICD-10-CM | POA: Insufficient documentation

## 2021-06-11 DIAGNOSIS — L309 Dermatitis, unspecified: Secondary | ICD-10-CM | POA: Insufficient documentation

## 2021-06-11 DIAGNOSIS — N952 Postmenopausal atrophic vaginitis: Secondary | ICD-10-CM | POA: Insufficient documentation

## 2021-06-12 ENCOUNTER — Encounter: Payer: Self-pay | Admitting: Cardiology

## 2021-06-12 ENCOUNTER — Other Ambulatory Visit: Payer: Self-pay

## 2021-06-12 ENCOUNTER — Ambulatory Visit: Payer: 59 | Admitting: Cardiology

## 2021-06-12 ENCOUNTER — Other Ambulatory Visit (HOSPITAL_COMMUNITY): Payer: Self-pay

## 2021-06-12 VITALS — BP 150/92 | HR 98 | Ht 65.0 in | Wt 210.0 lb

## 2021-06-12 DIAGNOSIS — F419 Anxiety disorder, unspecified: Secondary | ICD-10-CM | POA: Diagnosis not present

## 2021-06-12 DIAGNOSIS — C50411 Malignant neoplasm of upper-outer quadrant of right female breast: Secondary | ICD-10-CM | POA: Diagnosis not present

## 2021-06-12 DIAGNOSIS — R079 Chest pain, unspecified: Secondary | ICD-10-CM | POA: Diagnosis not present

## 2021-06-12 DIAGNOSIS — Z170 Estrogen receptor positive status [ER+]: Secondary | ICD-10-CM

## 2021-06-12 DIAGNOSIS — I10 Essential (primary) hypertension: Secondary | ICD-10-CM

## 2021-06-12 MED ORDER — ASPIRIN EC 81 MG PO TBEC: 81.0000 mg | DELAYED_RELEASE_TABLET | Freq: Every day | ORAL | 3 refills | Status: AC

## 2021-06-12 MED ORDER — METOPROLOL TARTRATE 100 MG PO TABS
100.0000 mg | ORAL_TABLET | Freq: Once | ORAL | 0 refills | Status: DC
  Filled 2021-06-12: qty 1, 1d supply, fill #0

## 2021-06-12 MED ORDER — METOPROLOL TARTRATE 50 MG PO TABS
50.0000 mg | ORAL_TABLET | Freq: Once | ORAL | 0 refills | Status: DC
  Filled 2021-06-12: qty 1, 1d supply, fill #0

## 2021-06-12 MED ORDER — CARVEDILOL 6.25 MG PO TABS
6.2500 mg | ORAL_TABLET | Freq: Two times a day (BID) | ORAL | 1 refills | Status: DC
  Filled 2021-06-12: qty 180, 90d supply, fill #0
  Filled 2021-09-26: qty 180, 90d supply, fill #1

## 2021-06-12 NOTE — Patient Instructions (Signed)
Medication Instructions:  Your physician has recommended you make the following change in your medication:  START: Aspirin 81 mg daily.   *If you need a refill on your cardiac medications before your next appointment, please call your pharmacy*   Lab Work: Your physician recommends that you return for lab work 3-7 days before ct : BMP  If you have labs (blood work) drawn today and your tests are completely normal, you will receive your results only by: [place] (if you have MyChart) OR A paper copy in the mail If you have any lab test that is abnormal or we need to change your treatment, we will call you to review the results.   Testing/Procedures: Your physician has requested that you have an echocardiogram. Echocardiography is a painless test that uses sound waves to create images of your heart. It provides your doctor with information about the size and shape of your heart and how well your heart's chambers and valves are working. This procedure takes approximately one hour. There are no restrictions for this procedure.    Your cardiac CT will be scheduled at one of the below locations:   [facility] [address] [place], [place] [address] [phone]  [place] [address] [place], [place] [address] [phone]  If scheduled at [facility], please arrive at the [facility] main entrance (entrance A) of [facility] 30 minutes prior to test start time. You can use the FREE valet parking offered at the main entrance (encouraged to control the heart rate for the test) Proceed to the [facility] Radiology Department (first floor) to check-in and test prep.  If scheduled at [facility], please arrive 15 mins early for check-in and test prep.  Please follow these instructions carefully (unless otherwise directed):  Hold all erectile dysfunction medications at  least 3 days (72 hrs) prior to test.  On the Night Before the Test: Be sure to Drink plenty of water. Do not consume any caffeinated/decaffeinated beverages or chocolate 12 hours prior to your test. Do not take any antihistamines 12 hours prior to your test.   On the Day of the Test: Drink plenty of water until 1 hour prior to the test. Do not eat any food 4 hours prior to the test. You may take your regular medications prior to the test.  Take metoprolol (Lopressor) two hours prior to test. FEMALES- please wear underwire-free bra if available, avoid dresses & tight clothing          After the Test: Drink plenty of water. After receiving IV contrast, you may experience a mild flushed feeling. This is normal. On occasion, you may experience a mild rash up to 24 hours after the test. This is not dangerous. If this occurs, you can take Benadryl 25 mg and increase your fluid intake. If you experience trouble breathing, this can be serious. If it is severe call 911 IMMEDIATELY. If it is mild, please call our office. If you take any of these medications: Glipizide/Metformin, Avandament, Glucavance, please do not take 48 hours after completing test unless otherwise instructed.  Please allow 2-4 weeks for scheduling of routine cardiac CTs. Some insurance companies require a pre-authorization which may delay scheduling of this test.   For non-scheduling related questions, please contact the cardiac imaging nurse navigator should you have any questions/concerns: Marchia Bond, Cardiac Imaging Nurse Navigator Gordy Clement, Cardiac Imaging Nurse Navigator [place]  [facility] Direct Office Dial: [phone]   For scheduling needs, including cancellations and rescheduling, please call [place], [phone].    Follow-Up: At [facility], you and your health needs are our priority.  As part of our continuing mission to provide you with exceptional heart care, we have  created designated Provider Care Teams.  These Care Teams include your primary Cardiologist (physician) and Advanced Practice Providers (APPs -  Physician Assistants and Nurse Practitioners) who all work together to provide you with the care you need, when you need it.  We recommend signing up for the patient portal called "MyChart".  Sign up information is provided on this After Visit Summary.  MyChart is used to connect with patients for Virtual Visits (Telemedicine).  Patients are able to view lab/test results, encounter notes, upcoming appointments, etc.  Non-urgent messages can be sent to your provider as well.   To learn more about what you can do with MyChart, go to [email or web address].    Your next appointment:   2 month(s)  The format for your next appointment:   In Person  Provider:   Jenne Campus, MD     Other Instructions  Aspirin and Your Heart Aspirin is a medicine that prevents the platelets in your blood from sticking together. Platelets are the cells that your blood uses for clotting. Aspirin can be used to help reduce the risk of blood clots, heart attacks, and other heart-related problems. What are the risks? Daily use of aspirin can cause side effects. Some of these include: Bleeding. Bleeding can be minor or serious. An example of minor bleeding is bleeding from a cut, and the bleeding does not stop. An example of more serious bleeding is stomach bleeding or, rarely, bleeding into the brain. Your risk of bleeding increases if you are also taking NSAIDs, such as ibuprofen. Increased bruising. Upset stomach. An allergic reaction. People who have growths inside the nose (nasal polyps) have an increased risk of developing an aspirin allergy. How to use aspirin to care for your heart  Take aspirin only as told by your health care provider. Make sure that you understand how much to take and what form to take. The two forms of aspirin are: Non-enteric-coated.This  type of aspirin does not have a coating and is absorbed quickly. This type of aspirin also comes in a chewable form. Enteric-coated. This type of aspirin has a coating that releases the medicine very slowly. Enteric-coated aspirin might cause less stomach upset than non-enteric-coated aspirin. This type of aspirin should not be chewed or crushed. Work with your health care provider to find out whether it is safe and beneficial for you to take aspirin daily. Taking aspirin daily may be helpful if: You have had a heart attack or chest pain, or you are at risk for a heart attack. You have a condition in which certain heart vessels are blocked (coronary artery disease), and you have had a procedure to treat it. Examples are: Open-heart surgery, such as coronary artery bypass surgery (CABG). Coronary angioplasty,which is done to widen a blood vessel of your heart. Having a small mesh tube, or stent, placed in your coronary artery. You have had certain types of stroke or a mini-stroke known as a transient ischemic attack (TIA). You have a narrowing of the arteries that supply the limbs (peripheral vascular disease, or PVD). You have long-term (chronic) heart rhythm problems, such as atrial fibrillation, and your health care provider thinks aspirin may help. You have valve  disease, have had a heart valve replacement, or have had surgery on a valve. You are considered at increased risk of developing coronary artery disease or PVD. Follow these instructions at home Medicines Take over-the-counter and prescription medicines only as told by your health care provider. If you are taking blood thinners: Talk with your health care provider before you take any medicines that contain aspirin or NSAIDs, such as ibuprofen. These medicines increase your risk for dangerous bleeding. Take your medicine exactly as told, at the same time every day. Avoid activities that could cause injury or bruising, and follow  instructions about how to prevent falls. Wear a medical alert bracelet or carry a card that lists what medicines you take. General instructions Do not drink alcohol if: Your health care provider tells you not to drink. You are pregnant, may be pregnant, or are planning to become pregnant. If you drink alcohol: Limit how much you have to: 0-1 drink a day for women. 0-2 drinks a day for men. Know how much alcohol is in your drink. In the [place] one drink equals one 12 oz bottle of beer (355 mL), one 5 oz glass of wine (148 mL), or one 1 oz glass of hard liquor (44 mL). Keep all follow-up visits. This is important. Where to find more information The American Heart Association: www.heart.org The Centers for Disease Control and Prevention: http://www.wolf.info/ Contact a health care provider if: You have unusual bleeding or bruising. You have stomach pain or you feel nauseous. You have ringing in your ears. You have an allergic reaction that causes hives, itchy skin, or swelling of the lips, tongue, or face. Get help right away if: Your bowel movements are bloody, dark red, or black. You vomit or cough up blood. You have blood in your urine. You have a cough, make high-pitched whistling sounds most often heard when you breathe out (wheeze), or feel short of breath. You have chest pain, especially if the pain spreads to your arms, back, neck, or jaw. You have any symptoms of a stroke. "BE FAST" is an easy way to remember the main warning signs of a stroke: B - Balance. Signs are dizziness, sudden trouble walking, or loss of balance. E - Eyes. Signs are trouble seeing or a sudden change in vision. F - Face. Signs are sudden weakness or numbness of the face, or the face or eyelid drooping on one side. A - Arms. Signs are weakness or numbness in an arm. This happens suddenly and usually on one side of the body. S - Speech. Signs are sudden trouble speaking, slurred speech, or trouble understanding what  people say. T - Time. Time to call emergency services. Write down what time symptoms started. You have other signs of a stroke, such as: A sudden, severe headache with no known cause. Confusion. Nausea or vomiting. Seizure. These symptoms may represent a serious problem that is an emergency. Do not wait to see if the symptoms will go away. Get medical help right away. Call your local emergency services (911 in the [place] Do not drive yourself to the hospital. Summary Aspirin use can help reduce the risk of blood clots, heart attacks, and other heart-related problems. Daily use of aspirin can cause side effects. Take aspirin only as told by your health care provider. Make sure that you understand how much to take and what form to take. Your health care provider will help you determine whether it is safe and beneficial for you to take aspirin  daily. This information is not intended to replace advice given to you by your health care provider. Make sure you discuss any questions you have with your health care provider. Document Revised: 09/23/2020 Document Reviewed: 09/23/2020 Elsevier Patient Education  [place].  Cardiac CT Angiogram A cardiac CT angiogram is a procedure to look at the heart and the area around the heart. It may be done to help find the cause of chest pains or other symptoms of heart disease. During this procedure, a substance called contrast dye is injected into the blood vessels in the area to be checked. A large X-ray machine, called a CT scanner, then takes detailed pictures of the heart and the surrounding area. The procedure is also sometimes called a coronary CT angiogram, coronary artery scanning, or CTA. A cardiac CT angiogram allows the health care provider to see how well blood is flowing to and from the heart. The health care provider will be able to see if there are any problems, such as: Blockage or narrowing of the coronary arteries in the heart. Fluid  around the heart. Signs of weakness or disease in the muscles, valves, and tissues of the heart. Tell a health care provider about: Any allergies you have. This is especially important if you have had a previous allergic reaction to contrast dye. All medicines you are taking, including vitamins, herbs, eye drops, creams, and over-the-counter medicines. Any blood disorders you have. Any surgeries you have had. Any medical conditions you have. Whether you are pregnant or may be pregnant. Any anxiety disorders, chronic pain, or other conditions you have that may increase your stress or prevent you from lying still. What are the risks? Generally, this is a safe procedure. However, problems may occur, including: Bleeding. Infection. Allergic reactions to medicines or dyes. Damage to other structures or organs. Kidney damage from the contrast dye that is used. Increased risk of cancer from radiation exposure. This risk is low. Talk with your health care provider about: The risks and benefits of testing. How you can receive the lowest dose of radiation. What happens before the procedure? Wear comfortable clothing and remove any jewelry, glasses, dentures, and hearing aids. Follow instructions from your health care provider about eating and drinking. This may include: For 12 hours before the procedure -- avoid caffeine. This includes tea, coffee, soda, energy drinks, and diet pills. Drink plenty of water or other fluids that do not have caffeine in them. Being well hydrated can prevent complications. For 4-6 hours before the procedure -- stop eating and drinking. The contrast dye can cause nausea, but this is less likely if your stomach is empty. Ask your health care provider about changing or stopping your regular medicines. This is especially important if you are taking diabetes medicines, blood thinners, or medicines to treat problems with erections (erectile dysfunction). What happens during  the procedure?  Hair on your chest may need to be removed so that small sticky patches called electrodes can be placed on your chest. These will transmit information that helps to monitor your heart during the procedure. An IV will be inserted into one of your veins. You might be given a medicine to control your heart rate during the procedure. This will help to ensure that good images are obtained. You will be asked to lie on an exam table. This table will slide in and out of the CT machine during the procedure. Contrast dye will be injected into the IV. You might feel warm, or  you may get a metallic taste in your mouth. You will be given a medicine called nitroglycerin. This will relax or dilate the arteries in your heart. The table that you are lying on will move into the CT machine tunnel for the scan. The person running the machine will give you instructions while the scans are being done. You may be asked to: Keep your arms above your head. Hold your breath. Stay very still, even if the table is moving. When the scanning is complete, you will be moved out of the machine. The IV will be removed. The procedure may vary among health care providers and hospitals. What can I expect after the procedure? After your procedure, it is common to have: A metallic taste in your mouth from the contrast dye. A feeling of warmth. A headache from the nitroglycerin. Follow these instructions at home: Take over-the-counter and prescription medicines only as told by your health care provider. If you are told, drink enough fluid to keep your urine pale yellow. This will help to flush the contrast dye out of your body. Most people can return to their normal activities right after the procedure. Ask your health care provider what activities are safe for you. It is up to you to get the results of your procedure. Ask your health care provider, or the department that is doing the procedure, when your results  will be ready. Keep all follow-up visits as told by your health care provider. This is important. Contact a health care provider if: You have any symptoms of allergy to the contrast dye. These include: Shortness of breath. Rash or hives. A racing heartbeat. Summary A cardiac CT angiogram is a procedure to look at the heart and the area around the heart. It may be done to help find the cause of chest pains or other symptoms of heart disease. During this procedure, a large X-ray machine, called a CT scanner, takes detailed pictures of the heart and the surrounding area after a contrast dye has been injected into blood vessels in the area. Ask your health care provider about changing or stopping your regular medicines before the procedure. This is especially important if you are taking diabetes medicines, blood thinners, or medicines to treat erectile dysfunction. If you are told, drink enough fluid to keep your urine pale yellow. This will help to flush the contrast dye out of your body. This information is not intended to replace advice given to you by your health care provider. Make sure you discuss any questions you have with your health care provider. Document Revised: 04/04/2021 Document Reviewed: 03/17/2019 Elsevier Patient Education  2022 [employer].

## 2021-06-12 NOTE — Progress Notes (Signed)
 Cardiology Consultation:    Date:  06/12/2021   ID:  Miranda Orozco, DOB 03/24/1961, MRN [id number]  PCP:  Blyth, Stacey A, MD  Cardiologist:   , MD   Referring MD: Blyth, Stacey A, MD   Chief Complaint  Patient presents with   Establish Care    Risk factor per PCP and h/o breat cancer     History of Present Illness:    Miranda Orozco is a 60 y.o. female who is being seen today for the evaluation of chest pain at the request of Blyth, Stacey A, MD. with past medical history significant for essential hypertension, borderline diabetes, dyslipidemia, breast cancer status postsurgery many years ago.  She tells us that she did have surgery with lymph nodes removal as well as chemotherapy and radiation therapy.  She also mentioned that before therapy as well as doing to tolerate therapy she had echocardiogram performed.  She was referred to us because of chest pain.  Those episodes pain happen quite rarely.  She remembered last episode happening few months ago.  It usually happens when she is sitting at rest can last up to few minutes.  She does not sweat when she get if she does not get shortness of breath when it happened.  She does not exercise on the regular basis.  She is simply walking around and taking activity of daily living. She does not smoke, she is not on any special diet She does not have any family history of premature coronary artery disease. Apparently her cholesterol is high but she is not on any medication for it.  He is diabetic  Past Medical History:  Diagnosis Date   Arthritis, rheumatic, acute or subacute 10/02/2009   RA   BREAST CANCER, HX OF 10/02/2009   Disorder of bone and cartilage 10/02/2009   Qualifier: Diagnosis of  By: Jenkins MD, John E    Esophageal reflux 10/02/2009   Hives 09/18/2014   MALIGNANT NEOPLASM OF BREAST UNSPECIFIED SITE 10/02/2009   RIGHT CHMOE AND RADIATION DONE   Obesity    OSTEOPENIA 10/02/2009    Past Surgical History:   Procedure Laterality Date   BREAST LUMPECTOMY  03/20/2010   for margin   BREAST LUMPECTOMY  03/26/2010   for margin   CERVICAL CONIZATION W/BX Bilateral 02/06/2018   Procedure: CONIZATION CERVIX WITH BIOPSY;  Surgeon: Haygood, Vanessa P, MD;  Location: [facility];  Service: Gynecology;  Laterality: Bilateral;   CESAREAN SECTION     1996, 2001 x2   COLONSCOPY  2018   DILATATION & CURRETTAGE/HYSTEROSCOPY WITH RESECTOCOPE N/A 02/19/2013   Procedure: DILATATION & CURETTAGE/HYSTEROSCOPY WITH RESECTION OF POLYP;  Surgeon: Vanessa P Haygood, MD;  Location: [facility];  Service: Gynecology;  Laterality: N/A;   MASTECTOMY PARTIAL / LUMPECTOMY W/ AXILLARY LYMPHADENECTOMY  03/12/2010   Right w SLN Dr Streck   SIMPLE MASTECTOMY  03/31/2011   For margin    Current Medications: Current Meds  Medication Sig   ALPRAZolam (XANAX) 0.25 MG tablet TAKE 1 TABLET BY MOUTH TWICE DAILY AS NEEDED FOR ANXIETY OR SLEEP. (Patient taking differently: Take 0.25 mg by mouth 2 (two) times daily as needed for anxiety.)   atorvastatin (LIPITOR) 20 MG tablet Take 1 tablet (20 mg total) by mouth at bedtime.   blood glucose meter kit and supplies Use to check blood sugars three times daily (Patient taking differently: 1 each by Other route See admin instructions. Use to check blood sugars three times daily)   carvedilol (  COREG) 6.25 MG tablet Take 1 tablet by mouth 2 times daily with a meal.   glucose blood test strip Use as instructed. Dispense based on patient and insurance preference. Use up to four times daily as directed. Dz:E11.9  Check BS TID prn (Patient taking differently: 1 each by Other route as needed for other (Use to check blood sugar 3 times a day). Use as instructed. Dispense based on patient and insurance preference. Use up to four times daily as dirUse to check blood sugar 3 times a dayected. Dz:E11.9  Check BS TID prn)   losartan (COZAAR) 25 MG tablet Take 1 tablet (25 mg total) by mouth  daily.   metFORMIN (GLUCOPHAGE) 1000 MG tablet Take 1 tablet (1,000 mg total) by mouth 2 (two) times daily with a meal.   Multiple Vitamins-Minerals (MULTIVITAMIN WITH MINERALS) tablet Take 1 tablet by mouth daily. Unknown strenght     Allergies:   Patient has no known allergies.   Social History   Socioeconomic History   Marital status: Married    Spouse name: Not on file   Number of children: Not on file   Years of education: Not on file   Highest education level: Not on file  Occupational History   Not on file  Tobacco Use   Smoking status: Former    Packs/day: 0.50    Years: 1.00    Pack years: 0.50    Types: Cigarettes    Quit date: 10/17/1981    Years since quitting: 39.6   Smokeless tobacco: Never  Vaping Use   Vaping Use: Never used  Substance and Sexual Activity   Alcohol use: Yes    Alcohol/week: 2.0 - 3.0 standard drinks    Types: 2 - 3 Glasses of wine per week   Drug use: No   Sexual activity: Yes    Birth control/protection: None    Comment: lives with husband and children, no dietary restriction  Other Topics Concern   Not on file  Social History Narrative   Not on file   Social Determinants of Health   Financial Resource Strain: Not on file  Food Insecurity: Not on file  Transportation Needs: Not on file  Physical Activity: Not on file  Stress: Not on file  Social Connections: Not on file     Family History: The patient's family history includes Arthritis in an other family member; Cancer in her father; Diabetes in her father and another family member; Hyperlipidemia in her mother; Hypertension in her father, mother, and another family member. There is no history of Colon cancer or Stomach cancer. ROS:   Please see the history of present illness.    All 14 point review of systems negative except as described per history of present illness.  EKGs/Labs/Other Studies Reviewed:    The following studies were reviewed today:   EKG:  EKG is   ordered today.  The ekg ordered today demonstrates normal sinus rhythm normal P interval criteria for LVH, nonspecific ST segment changes  Recent Labs: 05/01/2021: ALT 60; BUN 16; Creatinine, Ser 0.75; Hemoglobin 13.8; Platelets 251.0; Potassium 4.1; Sodium 137; TSH 5.76  Recent Lipid Panel    Component Value Date/Time   CHOL 253 (H) 05/01/2021 1301   TRIG 363.0 (H) 05/01/2021 1301   HDL 43.60 05/01/2021 1301   CHOLHDL 6 05/01/2021 1301   VLDL 72.6 (H) 05/01/2021 1301   LDLDIRECT 139.0 05/01/2021 1301    Physical Exam:    VS:  BP (!) 150/92 (BP  Location: Left Arm, Patient Position: Sitting)   Pulse 98   Ht 5' 5" (1.651 m)   Wt 210 lb (95.3 kg)   SpO2 96%   BMI 34.95 kg/m     Wt Readings from Last 3 Encounters:  06/12/21 210 lb (95.3 kg)  05/01/21 213 lb (96.6 kg)  12/26/20 217 lb (98.4 kg)     GEN:  Well nourished, well developed in no acute distress HEENT: Normal NECK: No JVD; No carotid bruits LYMPHATICS: No lymphadenopathy CARDIAC: RRR, no murmurs, no rubs, no gallops RESPIRATORY:  Clear to auscultation without rales, wheezing or rhonchi  ABDOMEN: Soft, non-tender, non-distended MUSCULOSKELETAL:  No edema; No deformity  SKIN: Warm and dry NEUROLOGIC:  Alert and oriented x 3 PSYCHIATRIC:  Normal affect   ASSESSMENT:    1. Malignant neoplasm of upper-outer quadrant of right breast in female, estrogen receptor positive ([place])   2. Anxiety   3. Primary hypertension    PLAN:    In order of problems listed above:  Atypical chest pain this lady with multiple risk factors for coronary artery disease.  She is hypertensive does have dyslipidemia as well as borderline diabetes.  On top of that she was exposed to probably cardiotoxic medication as well as radiation therapy because of her breast cancer.  Luckily it was right breast.  I think we need to evaluate her for possibility of coronary artery disease.  We did discuss all possible options including medical therapy  versus stress testing versus coronary CT angio versus cardiac catheterization.  I think the best test for her will be coronary CT angio.  We did explain procedure to her we will proceed. History of exposure to Probably cardiotoxic medication during the therapy for breast cancer.  I do not have a list of medication she has been taking but she tells me that she was having echocardiogram done on the regular basis.  We will repeat echocardiogram to make sure her heart is still in good shape.  I will also look for evidence of left ventricle hypertrophy. Essential hypertension, blood pressure uncontrolled yet.  Will check Chem-7 in anticipation of coronary CT angio and will increase dose of Cozaar.  She also tell me that for a few days she missed dosages of carvedilol.  She does have already LVH on EKG.  We will recheck on echocardiogram. Dyslipidemia with her LDL being 136.  She need to be on most likely moderate intensity statin which is already on.  dose of the statin to be increased.   Medication Adjustments/Labs and Tests Ordered: Current medicines are reviewed at length with the patient today.  Concerns regarding medicines are outlined above.  No orders of the defined types were placed in this encounter.  No orders of the defined types were placed in this encounter.   Signed, Park Liter, MD, [facility]. 06/12/2021 2:36 PM    [facility]

## 2021-06-19 ENCOUNTER — Telehealth (HOSPITAL_COMMUNITY): Payer: Self-pay | Admitting: Emergency Medicine

## 2021-06-19 DIAGNOSIS — R079 Chest pain, unspecified: Secondary | ICD-10-CM | POA: Diagnosis not present

## 2021-06-19 DIAGNOSIS — F419 Anxiety disorder, unspecified: Secondary | ICD-10-CM | POA: Diagnosis not present

## 2021-06-19 DIAGNOSIS — Z170 Estrogen receptor positive status [ER+]: Secondary | ICD-10-CM | POA: Diagnosis not present

## 2021-06-19 DIAGNOSIS — I10 Essential (primary) hypertension: Secondary | ICD-10-CM | POA: Diagnosis not present

## 2021-06-19 DIAGNOSIS — C50411 Malignant neoplasm of upper-outer quadrant of right female breast: Secondary | ICD-10-CM | POA: Diagnosis not present

## 2021-06-19 NOTE — Telephone Encounter (Signed)
Reaching out to patient to offer assistance regarding upcoming cardiac imaging study; pt verbalizes understanding of appt date/time, parking situation and where to check in, pre-test NPO status and medications ordered, and verified current allergies; name and call back number provided for further questions should they arise Marchia Bond RN Navigator Cardiac [facility] [phone] office [phone] cell  Getting labs today 50mg  metoprolol (consulting with RK for more meds) Denies iv issues

## 2021-06-20 ENCOUNTER — Telehealth: Payer: Self-pay

## 2021-06-20 LAB — BASIC METABOLIC PANEL
BUN/Creatinine Ratio: 21 (ref 9–23)
BUN: 15 mg/dL (ref 6–24)
CO2: 24 mmol/L (ref 20–29)
Calcium: 9.4 mg/dL (ref 8.7–10.2)
Chloride: 101 mmol/L (ref 96–106)
Creatinine, Ser: 0.72 mg/dL (ref 0.57–1.00)
Glucose: 109 mg/dL — ABNORMAL HIGH (ref 70–99)
Potassium: 4 mmol/L (ref 3.5–5.2)
Sodium: 140 mmol/L (ref 134–144)
eGFR: 96 mL/min/{1.73_m2} (ref >59)

## 2021-06-20 NOTE — Telephone Encounter (Signed)
-----   Message from Park Liter, MD sent at 06/20/2021 10:16 AM EST ----- Chem-7 looks good

## 2021-06-21 ENCOUNTER — Ambulatory Visit (HOSPITAL_COMMUNITY)
Admission: RE | Admit: 2021-06-21 | Discharge: 2021-06-21 | Disposition: A | Discharge status: Home / Self Care | Payer: 59 | Source: Ambulatory Visit | Attending: Cardiology | Admitting: Cardiology

## 2021-06-21 ENCOUNTER — Other Ambulatory Visit: Payer: Self-pay

## 2021-06-21 DIAGNOSIS — C50411 Malignant neoplasm of upper-outer quadrant of right female breast: Secondary | ICD-10-CM | POA: Diagnosis not present

## 2021-06-21 DIAGNOSIS — Z170 Estrogen receptor positive status [ER+]: Secondary | ICD-10-CM | POA: Diagnosis not present

## 2021-06-21 DIAGNOSIS — R079 Chest pain, unspecified: Secondary | ICD-10-CM | POA: Insufficient documentation

## 2021-06-21 MED ORDER — IOHEXOL 350 MG/ML SOLN
100.0000 mL | Freq: Once | INTRAVENOUS | Status: AC | PRN
  Administered 2021-06-21: 18:00:00 100 mL via INTRAVENOUS

## 2021-06-21 MED ORDER — NITROGLYCERIN 0.4 MG SL SUBL
0.8000 mg | SUBLINGUAL_TABLET | Freq: Once | SUBLINGUAL | Status: AC
  Administered 2021-06-21: 0.8 mg via SUBLINGUAL

## 2021-06-21 MED ORDER — NITROGLYCERIN 0.4 MG SL SUBL
SUBLINGUAL_TABLET | SUBLINGUAL | Status: AC
  Filled 2021-06-21: qty 2

## 2021-06-21 NOTE — Telephone Encounter (Signed)
Spoke with the patient, confirmed she did not take Carvedilol today, she did take the the prescribed metoprolol instead.

## 2021-06-22 ENCOUNTER — Ambulatory Visit (INDEPENDENT_AMBULATORY_CARE_PROVIDER_SITE_OTHER): Payer: 59

## 2021-06-22 DIAGNOSIS — Z170 Estrogen receptor positive status [ER+]: Secondary | ICD-10-CM

## 2021-06-22 DIAGNOSIS — R079 Chest pain, unspecified: Secondary | ICD-10-CM | POA: Diagnosis not present

## 2021-06-22 DIAGNOSIS — C50411 Malignant neoplasm of upper-outer quadrant of right female breast: Secondary | ICD-10-CM | POA: Diagnosis not present

## 2021-06-22 LAB — ECHOCARDIOGRAM COMPLETE
Area-P 1/2: 6.12 cm2
S' Lateral: 2.8 cm

## 2021-06-25 ENCOUNTER — Telehealth: Payer: Self-pay | Admitting: Cardiology

## 2021-06-25 NOTE — Telephone Encounter (Signed)
Follow Up:     Patient saw her Echo and CT results on y Chart. She needs somebody to explain the results please.p

## 2021-06-25 NOTE — Telephone Encounter (Signed)
Spoke with patient regarding results and recommendation.  Patient verbalizes understanding and is agreeable to plan of care. Advised patient to call back with any issues or concerns.  

## 2021-07-17 ENCOUNTER — Encounter: Payer: Self-pay | Admitting: Family Medicine

## 2021-07-17 DIAGNOSIS — Z20822 Contact with and (suspected) exposure to covid-19: Secondary | ICD-10-CM | POA: Diagnosis not present

## 2021-07-18 NOTE — Telephone Encounter (Signed)
Pt is calling back to see what to do regarding her covid status, please advise.

## 2021-07-19 ENCOUNTER — Other Ambulatory Visit (HOSPITAL_COMMUNITY): Payer: Self-pay

## 2021-07-19 ENCOUNTER — Other Ambulatory Visit: Payer: Self-pay | Admitting: Family Medicine

## 2021-07-19 ENCOUNTER — Other Ambulatory Visit: Payer: Self-pay

## 2021-07-19 MED ORDER — MOLNUPIRAVIR EUA 200MG CAPSULE: 4.0000 | ORAL_CAPSULE | Freq: Two times a day (BID) | ORAL | 0 refills | Status: AC

## 2021-07-19 MED ORDER — MOLNUPIRAVIR EUA 200MG CAPSULE: 4.0000 | ORAL_CAPSULE | Freq: Two times a day (BID) | ORAL | 0 refills | Status: DC

## 2021-07-19 NOTE — Telephone Encounter (Signed)
Pt is calling again to get rx for antiviral, and would like it sent to [facility] on [address], [place] [address] [phone]

## 2021-07-20 NOTE — Telephone Encounter (Signed)
done

## 2021-07-31 ENCOUNTER — Encounter: Payer: Self-pay | Admitting: Oncology

## 2021-07-31 DIAGNOSIS — Z1231 Encounter for screening mammogram for malignant neoplasm of breast: Secondary | ICD-10-CM | POA: Diagnosis not present

## 2021-07-31 LAB — HM MAMMOGRAPHY

## 2021-08-08 ENCOUNTER — Other Ambulatory Visit: Payer: Self-pay | Admitting: Family Medicine

## 2021-08-08 ENCOUNTER — Other Ambulatory Visit (HOSPITAL_COMMUNITY): Payer: Self-pay

## 2021-08-08 MED ORDER — ALPRAZOLAM 0.25 MG PO TABS
ORAL_TABLET | ORAL | 1 refills | Status: DC
  Filled 2021-08-08: qty 20, 10d supply, fill #0
  Filled 2021-09-26: qty 20, 10d supply, fill #1

## 2021-08-08 NOTE — Telephone Encounter (Signed)
Requesting: alprazolam 0.25mg   Contract: 05/01/2021 UDS: 05/01/2021 Last Visit: 05/01/2021 Next Visit: 08/20/2021 Last Refill: 04/16/2021 #20 and 1RF  Please Advise

## 2021-08-15 DIAGNOSIS — H1045 Other chronic allergic conjunctivitis: Secondary | ICD-10-CM | POA: Diagnosis not present

## 2021-08-20 ENCOUNTER — Ambulatory Visit: Payer: 59 | Admitting: Family Medicine

## 2021-08-20 ENCOUNTER — Other Ambulatory Visit: Payer: Self-pay

## 2021-08-20 HISTORY — DX: Morbid (severe) obesity due to excess calories: E66.01

## 2021-08-22 ENCOUNTER — Ambulatory Visit: Payer: 59 | Admitting: Cardiology

## 2021-09-14 ENCOUNTER — Ambulatory Visit: Payer: 59 | Attending: Internal Medicine

## 2021-09-14 ENCOUNTER — Other Ambulatory Visit (HOSPITAL_BASED_OUTPATIENT_CLINIC_OR_DEPARTMENT_OTHER): Payer: Self-pay

## 2021-09-14 DIAGNOSIS — Z23 Encounter for immunization: Secondary | ICD-10-CM

## 2021-09-14 MED ORDER — PFIZER COVID-19 VAC BIVALENT 30 MCG/0.3ML IM SUSP
INTRAMUSCULAR | 0 refills | Status: AC
  Filled 2021-09-14: qty 0.3, 1d supply, fill #0

## 2021-09-14 NOTE — Progress Notes (Signed)
° °  Covid-19 Vaccination Clinic  Name:  Daun Rens    MRN: [id number] DOB: September 08, 1960  09/14/2021  Ms. Gafford was observed post Covid-19 immunization for 15 minutes without incident. She was provided with Vaccine Information Sheet and instruction to access the V-Safe system.   Ms. Husak was instructed to call 911 with any severe reactions post vaccine: Difficulty breathing  Swelling of face and throat  A fast heartbeat  A bad rash all over body  Dizziness and weakness   Immunizations Administered     Name Date Dose VIS Date Route   Pfizer Covid-19 Vaccine Bivalent Booster 09/14/2021 11:05 AM 0.3 mL 04/04/2021 Intramuscular   Manufacturer: [place]   Lot: [id number]   [employer]: [phone]

## 2021-09-19 DIAGNOSIS — F411 Generalized anxiety disorder: Secondary | ICD-10-CM | POA: Diagnosis not present

## 2021-09-25 DIAGNOSIS — F411 Generalized anxiety disorder: Secondary | ICD-10-CM | POA: Diagnosis not present

## 2021-09-26 ENCOUNTER — Other Ambulatory Visit (HOSPITAL_COMMUNITY): Payer: Self-pay

## 2021-09-26 ENCOUNTER — Other Ambulatory Visit: Payer: Self-pay | Admitting: Family Medicine

## 2021-09-26 MED ORDER — LOSARTAN POTASSIUM 25 MG PO TABS
25.0000 mg | ORAL_TABLET | Freq: Every day | ORAL | 0 refills | Status: DC
  Filled 2021-09-26 – 2022-02-13 (×2): qty 30, 30d supply, fill #0

## 2021-09-26 MED ORDER — METFORMIN HCL 1000 MG PO TABS
1000.0000 mg | ORAL_TABLET | Freq: Two times a day (BID) | ORAL | 0 refills | Status: DC
  Filled 2021-09-26: qty 30, 15d supply, fill #0

## 2021-10-04 ENCOUNTER — Other Ambulatory Visit (HOSPITAL_COMMUNITY): Payer: Self-pay

## 2021-10-15 ENCOUNTER — Encounter: Payer: Self-pay | Admitting: Family Medicine

## 2021-10-15 DIAGNOSIS — F411 Generalized anxiety disorder: Secondary | ICD-10-CM | POA: Diagnosis not present

## 2021-10-15 NOTE — Telephone Encounter (Signed)
Left VM asking for return call to schedule appointment. ?

## 2021-10-16 ENCOUNTER — Ambulatory Visit: Payer: 59 | Admitting: Family Medicine

## 2021-10-16 ENCOUNTER — Encounter: Payer: Self-pay | Admitting: Family Medicine

## 2021-10-16 VITALS — BP 138/88 | HR 95 | Ht 65.0 in | Wt 213.8 lb

## 2021-10-16 DIAGNOSIS — R319 Hematuria, unspecified: Secondary | ICD-10-CM

## 2021-10-16 DIAGNOSIS — N3001 Acute cystitis with hematuria: Secondary | ICD-10-CM

## 2021-10-16 DIAGNOSIS — R300 Dysuria: Secondary | ICD-10-CM

## 2021-10-16 LAB — POC URINALSYSI DIPSTICK (AUTOMATED)
Bilirubin, UA: NEGATIVE
Glucose, UA: NEGATIVE
Leukocytes, UA: NEGATIVE
Nitrite, UA: NEGATIVE
Protein, UA: NEGATIVE
Spec Grav, UA: 1.025 (ref 1.010–1.025)
Urobilinogen, UA: 0.2 E.U./dL
pH, UA: 5 (ref 5.0–8.0)

## 2021-10-16 LAB — URINALYSIS, MICROSCOPIC ONLY

## 2021-10-16 NOTE — Patient Instructions (Signed)
No signs of UTI in office, but I will send for culture to be sure there is nothing brewing. You did have some blood in your urine, so after we get culture results, I will let you know to come back in a few weeks to repeat and make sure the blood has cleared.  ?Stay well hydrated, continue cranberry supplements, and consider Azo if needed.  ?Will update you with culture results and plan.  ?

## 2021-10-16 NOTE — Progress Notes (Signed)
Acute Office Visit  Subjective:    Patient ID: Miranda Orozco, female    DOB: [birth year], 61 y.o.   MRN: [id number]  CC: dysuria   Dysuria  This is a new problem. The current episode started in the past 7 days. The problem occurs every urination. The problem has been waxing and waning. The quality of the pain is described as burning and aching. The pain is at a severity of 7/10. The pain is moderate. There has been no fever. She is Sexually active. There is No history of pyelonephritis. Associated symptoms include frequency and urgency. Pertinent negatives include no chills, flank pain, hematuria, hesitancy, nausea, sweats or vomiting. She has tried increased fluids (cranberry juice) for the symptoms. The treatment provided mild relief.   Just traveled back from [place] last Thursday; symptoms started Saturday/Sunday. More sexually active during vacation.     Past Medical History:  Diagnosis Date   Arthritis, rheumatic, acute or subacute 10/02/2009   RA   BREAST CANCER, HX OF 10/02/2009   Disorder of bone and cartilage 10/02/2009   Qualifier: Diagnosis of  By: Jenkins MD, John E    Esophageal reflux 10/02/2009   Hives 09/18/2014   MALIGNANT NEOPLASM OF BREAST UNSPECIFIED SITE 10/02/2009   RIGHT CHMOE AND RADIATION DONE   Morbid (severe) obesity due to excess calories ([facility]) 08/20/2021   Obesity    OSTEOPENIA 10/02/2009    Past Surgical History:  Procedure Laterality Date   BREAST LUMPECTOMY  03/20/2010   for margin   BREAST LUMPECTOMY  03/26/2010   for margin   CERVICAL CONIZATION W/BX Bilateral 02/06/2018   Procedure: CONIZATION CERVIX WITH BIOPSY;  Surgeon: Haygood, Vanessa P, MD;  Location: [facility];  Service: Gynecology;  Laterality: Bilateral;   CESAREAN SECTION     19 96, 2001 x2   COLONSCOPY  2018   DILATATION & CURRETTAGE/HYSTEROSCOPY WITH RESECTOCOPE N/A 02/19/2013   Procedure: DILATATION & CURETTAGE/HYSTEROSCOPY WITH RESECTION OF POLYP;   Surgeon: Hal Morales, MD;  Location: [facility];  Service: Gynecology;  Laterality: N/A;   MASTECTOMY PARTIAL / LUMPECTOMY W/ AXILLARY LYMPHADENECTOMY  03/12/2010   Right w SLN Dr Jamey Ripa   SIMPLE MASTECTOMY  03/31/2011   For margin    Family History  Problem Relation Age of Onset   Hypertension Mother    Hyperlipidemia Mother    Hypertension Father    Diabetes Father    Cancer Father        lung cancer, smoker   Arthritis Other    Diabetes Other    Hypertension Other    Colon cancer Neg Hx    Stomach cancer Neg Hx     Social History   Socioeconomic History   Marital status: Married    Spouse name: Not on file   Number of children: Not on file   Years of education: Not on file   Highest education level: Not on file  Occupational History   Not on file  Tobacco Use   Smoking status: Former    Packs/day: 0.50    Years: 1.00    Pack years: 0.50    Types: Cigarettes    Quit date: 10/17/1981    Years since quitting: 40.0   Smokeless tobacco: Never  Vaping Use   Vaping Use: Never used  Substance and Sexual Activity   Alcohol use: Yes    Alcohol/week: 2.0 - 3.0 standard drinks    Types: 2 - 3 Glasses of wine per week   Drug  use: No   Sexual activity: Yes    Birth control/protection: None    Comment: lives with husband and children, no dietary restriction  Other Topics Concern   Not on file  Social History Narrative   Not on file   Social Determinants of Health   Financial Resource Strain: Not on file  Food Insecurity: Not on file  Transportation Needs: Not on file  Physical Activity: Not on file  Stress: Not on file  Social Connections: Not on file  Intimate Partner Violence: Not on file    Outpatient Medications Prior to Visit  Medication Sig Dispense Refill   ALPRAZolam (XANAX) 0.25 MG tablet TAKE 1 TABLET BY MOUTH TWICE DAILY AS NEEDED FOR ANXIETY OR SLEEP. 20 tablet 1   aspirin EC 81 MG tablet Take 1 tablet (81 mg total) by mouth daily. Swallow whole.  90 tablet 3   atorvastatin (LIPITOR) 20 MG tablet Take 1 tablet (20 mg total) by mouth at bedtime. 30 tablet 4   blood glucose meter kit and supplies Use to check blood sugars three times daily (Patient taking differently: 1 each by Other route See admin instructions. Use to check blood sugars three times daily) 1 each 0   carvedilol (COREG) 6.25 MG tablet Take 1 tablet by mouth 2 times daily with a meal. 180 tablet 1   COVID-19 mRNA bivalent vaccine, Pfizer, (PFIZER COVID-19 VAC BIVALENT) injection Inject into the muscle. 0.3 mL 0   glucose blood test strip Use as instructed. Dispense based on patient and insurance preference. Use up to four times daily as directed. Dz:E11.9  Check BS TID prn (Patient taking differently: 1 each by Other route as needed for other (Use to check blood sugar 3 times a day). Use as instructed. Dispense based on patient and insurance preference. Use up to four times daily as dirUse to check blood sugar 3 times a dayected. Dz:E11.9  Check BS TID prn) 100 each 12   losartan (COZAAR) 25 MG tablet Take 1 tablet (25 mg total) by mouth daily. 30 tablet 0   metFORMIN (GLUCOPHAGE) 1000 MG tablet Take 1 tablet (1,000 mg total) by mouth 2 (two) times daily with a meal. 30 tablet 0   Multiple Vitamins-Minerals (MULTIVITAMIN WITH MINERALS) tablet Take 1 tablet by mouth daily. Unknown strenght     No facility-administered medications prior to visit.    No Known Allergies  Review of Systems All review of systems negative except what is listed in the HPI      Objective:    Physical Exam Vitals reviewed.  Constitutional:      Appearance: Normal appearance. She is obese.  HENT:     Head: Normocephalic and atraumatic.  Cardiovascular:     Rate and Rhythm: Normal rate and regular rhythm.  Pulmonary:     Effort: Pulmonary effort is normal.     Breath sounds: Normal breath sounds.  Abdominal:     General: Abdomen is flat. Bowel sounds are normal. There is no distension.      Palpations: Abdomen is soft. There is no mass.     Tenderness: There is no abdominal tenderness. There is no right CVA tenderness, left CVA tenderness, guarding or rebound.  Skin:    General: Skin is warm and dry.  Neurological:     General: No focal deficit present.     Mental Status: She is alert and oriented to person, place, and time. Mental status is at baseline.  Psychiatric:  Mood and Affect: Mood normal.        Behavior: Behavior normal.        Thought Content: Thought content normal.        Judgment: Judgment normal.       There were no vitals taken for this visit. Wt Readings from Last 3 Encounters:  06/12/21 210 lb (95.3 kg)  05/01/21 213 lb (96.6 kg)  12/26/20 217 lb (98.4 kg)    Health Maintenance Due  Topic Date Due   PAP SMEAR-Modifier  12/08/2020    There are no preventive care reminders to display for this patient.   Lab Results  Component Value Date   TSH 5.76 (H) 05/01/2021   Lab Results  Component Value Date   WBC 6.9 05/01/2021   HGB 13.8 05/01/2021   HCT 41.7 05/01/2021   MCV 93.9 05/01/2021   PLT 251.0 05/01/2021   Lab Results  Component Value Date   NA 140 06/19/2021   K 4.0 06/19/2021   CHLORIDE 103 07/02/2017   CO2 24 06/19/2021   GLUCOSE 109 (H) 06/19/2021   BUN 15 06/19/2021   CREATININE 0.72 06/19/2021   BILITOT 0.4 05/01/2021   ALKPHOS 85 05/01/2021   AST 36 05/01/2021   ALT 60 (H) 05/01/2021   PROT 7.9 05/01/2021   ALBUMIN 4.5 05/01/2021   CALCIUM 9.4 06/19/2021   ANIONGAP 9 06/29/2018   EGFR 96 06/19/2021   GFR 86.98 05/01/2021   Lab Results  Component Value Date   CHOL 253 (H) 05/01/2021   Lab Results  Component Value Date   HDL 43.60 05/01/2021   No results found for: [facility] Lab Results  Component Value Date   TRIG 363.0 (H) 05/01/2021   Lab Results  Component Value Date   CHOLHDL 6 05/01/2021   Lab Results  Component Value Date   HGBA1C 6.3 05/01/2021       Assessment & Plan:   1.  Dysuria 2. Hematuria, unspecified type UA with trace ketones; moderate blood, negative leuks/nitrites. Will send for culture (and micro if enough sample - otherwise repeat in a few weeks to ensure clearing). No alarm findings today. She is already starting to feel better. Recommend she continue hydrating well, cranberry supplements, Azo as needed. Patient aware of signs/symptoms requiring further/urgent evaluation.  - POCT Urinalysis Dipstick (Automated) - Urine Culture  Follow-up pending culture results or sooner if needed.    Clayborne Dana, NP

## 2021-10-16 NOTE — Progress Notes (Signed)
Dysuria started over the wknd ?

## 2021-10-19 ENCOUNTER — Other Ambulatory Visit (HOSPITAL_COMMUNITY): Payer: Self-pay

## 2021-10-19 LAB — URINE CULTURE
MICRO NUMBER:: 13128268
SPECIMEN QUALITY:: ADEQUATE

## 2021-10-19 MED ORDER — SULFAMETHOXAZOLE-TRIMETHOPRIM 800-160 MG PO TABS
1.0000 | ORAL_TABLET | Freq: Two times a day (BID) | ORAL | 0 refills | Status: AC
  Filled 2021-10-19: qty 6, 3d supply, fill #0

## 2021-10-19 NOTE — Progress Notes (Signed)
Your culture did reveal a urinary tract infection. I am sending in Bactrim for you for antibiotic therapy. Continue hydration, cranberry supplements, Azo as needed, good hygiene. Let us know if not improved after antibiotics.

## 2021-10-19 NOTE — Addendum Note (Signed)
Addended by: Caleen Jobs B on: 10/19/2021 09:48 AM ? ? Modules accepted: Orders ? ?

## 2021-10-23 DIAGNOSIS — F411 Generalized anxiety disorder: Secondary | ICD-10-CM | POA: Diagnosis not present

## 2021-10-31 DIAGNOSIS — F411 Generalized anxiety disorder: Secondary | ICD-10-CM | POA: Diagnosis not present

## 2021-11-18 ENCOUNTER — Other Ambulatory Visit: Payer: Self-pay | Admitting: Family Medicine

## 2021-11-19 ENCOUNTER — Other Ambulatory Visit (HOSPITAL_COMMUNITY): Payer: Self-pay

## 2021-11-19 MED ORDER — ALPRAZOLAM 0.25 MG PO TABS
ORAL_TABLET | ORAL | 1 refills | Status: DC
  Filled 2021-11-19: qty 20, 10d supply, fill #0
  Filled 2022-01-03: qty 20, 10d supply, fill #1

## 2021-11-19 NOTE — Telephone Encounter (Signed)
Requesting: Xanax 0.'25MG'$  ?Contract: 05/01/21 ?UDS: 05/01/21 ?Last Visit: 10/16/21 ?Next Visit: not scheduled ?Last Refill: 08/08/21 ? ?Please Advise  ?

## 2021-12-17 DIAGNOSIS — F411 Generalized anxiety disorder: Secondary | ICD-10-CM | POA: Diagnosis not present

## 2021-12-27 DIAGNOSIS — F411 Generalized anxiety disorder: Secondary | ICD-10-CM | POA: Diagnosis not present

## 2022-01-03 ENCOUNTER — Other Ambulatory Visit (HOSPITAL_COMMUNITY): Payer: Self-pay

## 2022-01-04 DIAGNOSIS — F411 Generalized anxiety disorder: Secondary | ICD-10-CM | POA: Diagnosis not present

## 2022-01-31 DIAGNOSIS — F411 Generalized anxiety disorder: Secondary | ICD-10-CM | POA: Diagnosis not present

## 2022-02-09 ENCOUNTER — Other Ambulatory Visit: Payer: Self-pay | Admitting: Family Medicine

## 2022-02-13 ENCOUNTER — Other Ambulatory Visit: Payer: Self-pay | Admitting: Family Medicine

## 2022-02-13 ENCOUNTER — Other Ambulatory Visit (HOSPITAL_COMMUNITY): Payer: Self-pay

## 2022-02-13 MED ORDER — ALPRAZOLAM 0.25 MG PO TABS
ORAL_TABLET | ORAL | 1 refills | Status: DC
  Filled 2022-02-13: qty 20, 10d supply, fill #0
  Filled 2022-04-17: qty 20, 10d supply, fill #1

## 2022-02-13 NOTE — Telephone Encounter (Signed)
Requesting: alprazolam 0.'25mg'$   Contract: 05/01/21 UDS: 05/01/21 Last Visit: 10/16/21 Next Visit: None Last Refill: 11/19/21 #20 and 0RF  Please Advise

## 2022-02-19 ENCOUNTER — Other Ambulatory Visit (HOSPITAL_COMMUNITY): Payer: Self-pay

## 2022-02-19 ENCOUNTER — Other Ambulatory Visit: Payer: Self-pay | Admitting: Family Medicine

## 2022-02-20 ENCOUNTER — Other Ambulatory Visit: Payer: Self-pay | Admitting: Family Medicine

## 2022-02-20 ENCOUNTER — Other Ambulatory Visit (HOSPITAL_COMMUNITY): Payer: Self-pay

## 2022-03-07 DIAGNOSIS — F411 Generalized anxiety disorder: Secondary | ICD-10-CM | POA: Diagnosis not present

## 2022-04-17 ENCOUNTER — Other Ambulatory Visit (HOSPITAL_COMMUNITY): Payer: Self-pay

## 2022-04-17 ENCOUNTER — Other Ambulatory Visit: Payer: Self-pay | Admitting: Family Medicine

## 2022-04-24 ENCOUNTER — Other Ambulatory Visit (HOSPITAL_COMMUNITY): Payer: Self-pay

## 2022-05-22 DIAGNOSIS — F411 Generalized anxiety disorder: Secondary | ICD-10-CM | POA: Diagnosis not present

## 2022-05-23 ENCOUNTER — Encounter: Payer: Self-pay | Admitting: Emergency Medicine

## 2022-05-23 ENCOUNTER — Ambulatory Visit
Admission: EM | Admit: 2022-05-23 | Discharge: 2022-05-23 | Disposition: A | Discharge status: Home / Self Care | Payer: 59 | Attending: Family Medicine | Admitting: Family Medicine

## 2022-05-23 DIAGNOSIS — L03011 Cellulitis of right finger: Secondary | ICD-10-CM | POA: Diagnosis not present

## 2022-05-23 MED ORDER — CEPHALEXIN 500 MG PO CAPS: 500.0000 mg | ORAL_CAPSULE | Freq: Three times a day (TID) | ORAL | 0 refills | Status: DC

## 2022-05-23 NOTE — ED Provider Notes (Signed)
[name]    CSN: [id number] Arrival date & time: 05/23/22  1647      History   Chief Complaint Chief Complaint  Patient presents with   finger infection     HPI Miranda Orozco is a 61 y.o. female.   HPI  Patient is here for paronychia.  She has a swollen painful finger.  She has been trying warm salt water soaks.  She has been taking ibuprofen.  In spite of this her finger is swollen and painful.  Throbbing  Past Medical History:  Diagnosis Date   Arthritis, rheumatic, acute or subacute 10/02/2009   RA   BREAST CANCER, HX OF 10/02/2009   Disorder of bone and cartilage 10/02/2009   Qualifier: Diagnosis of  By: [name] MD, John E    Esophageal reflux 10/02/2009   Hives 09/18/2014   MALIGNANT NEOPLASM OF BREAST UNSPECIFIED SITE 10/02/2009   RIGHT CHMOE AND RADIATION DONE   Morbid (severe) obesity due to excess calories ([place]) 08/20/2021   Obesity    OSTEOPENIA 10/02/2009    Patient Active Problem List   Diagnosis Date Noted   Morbid (severe) obesity due to excess calories ([place]) 08/20/2021   Osteopenia 06/11/2021   Malignant tumor of breast ([place]) 06/11/2021   Fibroadenoma of breast 06/11/2021   Eczema 06/11/2021   Cyst of ovary 06/11/2021   Atrophy of vagina 06/11/2021   Cervical cancer screening 05/04/2021   Hypertension 04/14/2019   Hyperglycemia 10/05/2018   Anxiety 07/06/2018   Bronchitis 07/06/2018   Atypical chest pain 12/21/2017   Palpitation 12/21/2017   Rheumatoid arthritis ([place]) 05/30/2016   Hives 09/18/2014   Hyperlipidemia, mixed 09/18/2014   Malignant neoplasm of upper-outer quadrant of right breast in female, estrogen receptor positive ([place]) 05/06/2013   PMB (postmenopausal bleeding) 02/19/2013   Endometrial mass 02/19/2013   Encounter for colorectal cancer screening 11/18/2011   Rash 11/18/2011   Preventative health care 11/18/2011   Obesity (BMI 30.0-34.9) 10/09/2011   HYPOGLYCEMIA, UNSPECIFIED 10/06/2009   Anemia 10/02/2009    ESOPHAGEAL REFLUX 10/02/2009   OSTEOARTHRITIS 10/02/2009   Disorder of bone and cartilage 10/02/2009   BREAST CANCER, HX OF 10/02/2009   Fibroid, uterus 12/18/2003    Past Surgical History:  Procedure Laterality Date   BREAST LUMPECTOMY  03/20/2010   for margin   BREAST LUMPECTOMY  03/26/2010   for margin   CERVICAL CONIZATION W/BX Bilateral 02/06/2018   Procedure: CONIZATION CERVIX WITH BIOPSY;  Surgeon: Eldred Manges, MD;  Location: [facility];  Service: Gynecology;  Laterality: Bilateral;   [place], 2001 x2   COLONSCOPY  2018   DILATATION & CURRETTAGE/HYSTEROSCOPY WITH RESECTOCOPE N/A 02/19/2013   Procedure: DILATATION & CURETTAGE/HYSTEROSCOPY WITH RESECTION OF POLYP;  Surgeon: Eldred Manges, MD;  Location: [facility];  Service: Gynecology;  Laterality: N/A;   MASTECTOMY PARTIAL / LUMPECTOMY W/ AXILLARY LYMPHADENECTOMY  03/12/2010   Right w SLN Dr Margot Chimes   SIMPLE MASTECTOMY  03/31/2011   For margin    OB History     Gravida  4   Para  3   Term  2   Preterm  1   AB  1   Living  0      SAB  0   IAB  1   Ectopic  0   Multiple  0   Live Births               Home Medications    Prior  to Admission medications   Medication Sig Start Date End Date Taking? Authorizing Provider  cephALEXin (KEFLEX) 500 MG capsule Take 1 capsule (500 mg total) by mouth 3 (three) times daily. 05/23/22  Yes Raylene Everts, MD  ALPRAZolam Duanne Moron) 0.25 MG tablet TAKE 1 TABLET BY MOUTH TWICE DAILY AS NEEDED FOR ANXIETY OR SLEEP. 02/13/22 08/12/22  Mosie Lukes, MD  aspirin EC 81 MG tablet Take 1 tablet (81 mg total) by mouth daily. Swallow whole. 06/12/21   Park Liter, MD  atorvastatin (LIPITOR) 20 MG tablet Take 1 tablet (20 mg total) by mouth at bedtime. 05/02/21   Mosie Lukes, MD  carvedilol (COREG) 6.25 MG tablet TAKE 1 TABLET BY MOUTH 2 TIMES DAILY WITH A MEAL. 02/11/22   Mosie Lukes, MD  COVID-19 mRNA bivalent  vaccine, Pfizer, (PFIZER COVID-19 VAC BIVALENT) injection Inject into the muscle. 09/14/21   Carlyle Basques, MD  glucose blood test strip Use as instructed. Dispense based on patient and insurance preference. Use up to four times daily as directed. Dz:E11.9  Check BS TID prn Patient taking differently: 1 each by Other route as needed for other (Use to check blood sugar 3 times a day). Use as instructed. Dispense based on patient and insurance preference. Use up to four times daily as dirUse to check blood sugar 3 times a dayected. Dz:E11.9  Check BS TID prn 04/30/19   Mosie Lukes, MD  losartan (COZAAR) 25 MG tablet Take 1 tablet (25 mg total) by mouth daily. 09/26/21   Mosie Lukes, MD    Family History Family History  Problem Relation Age of Onset   Hypertension Mother    Hyperlipidemia Mother    Hypertension Father    Diabetes Father    Cancer Father        lung cancer, smoker   Arthritis Other    Diabetes Other    Hypertension Other    Colon cancer Neg Hx    Stomach cancer Neg Hx     Social History Social History   Tobacco Use   Smoking status: Former    Packs/day: 0.50    Years: 1.00    Total pack years: 0.50    Types: Cigarettes    Quit date: 10/17/1981    Years since quitting: 40.6   Smokeless tobacco: Never  Vaping Use   Vaping Use: Never used  Substance Use Topics   Alcohol use: Yes    Alcohol/week: 2.0 - 3.0 standard drinks of alcohol    Types: 2 - 3 Glasses of wine per week   Drug use: No     Allergies   Patient has no known allergies.   Review of Systems Review of Systems See HPI  Physical Exam Triage Vital Signs ED Triage Vitals  Enc Vitals Group     BP 05/23/22 1726 138/85     Pulse Rate 05/23/22 1726 82     Resp 05/23/22 1726 16     Temp 05/23/22 1726 99.6 F (37.6 C)     Temp Source 05/23/22 1726 Oral     SpO2 05/23/22 1726 98 %     Weight 05/23/22 1729 210 lb (95.3 kg)     Height 05/23/22 1729 '5\' 5"'$  (1.651 m)     Head  Circumference --      Peak Flow --      Pain Score 05/23/22 1728 8     Pain Loc --      Pain Edu? --  Excl. in GC? --    No data found.  Updated Vital Signs BP 138/85 (BP Location: Left Arm) Comment (BP Location): no BP  on right  Pulse 82   Temp 99.6 F (37.6 C) (Oral)   Resp 16   Ht '5\' 5"'$  (1.651 m)   Wt 95.3 kg   SpO2 98%   BMI 34.95 kg/m       Physical Exam Constitutional:      General: She is not in acute distress.    Appearance: She is well-developed.  HENT:     Head: Normocephalic and atraumatic.  Eyes:     Conjunctiva/sclera: Conjunctivae normal.     Pupils: Pupils are equal, round, and reactive to light.  Cardiovascular:     Rate and Rhythm: Normal rate.  Pulmonary:     Effort: Pulmonary effort is normal. No respiratory distress.  Abdominal:     General: There is no distension.     Palpations: Abdomen is soft.  Musculoskeletal:        General: Normal range of motion.     Cervical back: Normal range of motion.  Skin:    General: Skin is warm and dry.     Findings: Lesion present.     Comments: Ring finger of the right hand has a paronychia and some swelling around the tip.  Erythema.  Warmth.  Very tender.  It is cleansed with alcohol and opened with an 18-gauge needle.  Purulence expressed  Neurological:     Mental Status: She is alert.  Psychiatric:        Mood and Affect: Mood normal.        Behavior: Behavior normal.      UC Treatments / Results  Labs (all labs ordered are listed, but only abnormal results are displayed) Labs Reviewed - No data to display  EKG   Radiology No results found.  Procedures Procedures (including critical care time)  Medications Ordered in UC Medications - No data to display  Initial Impression / Assessment and Plan / UC Course  I have reviewed the triage vital signs and the nursing notes.  Pertinent labs & imaging results that were available during my care of the patient were reviewed by me and  considered in my medical decision making (see chart for details).     Final Clinical Impressions(s) / UC Diagnoses   Final diagnoses:  Paronychia of right ring finger     Discharge Instructions      Continue warm soaks for 20 minutes 2 or 3 times a day Take the Keflex antibiotic 3 times a day Call or return for problems   ED Prescriptions     Medication Sig Dispense Auth. Provider   cephALEXin (KEFLEX) 500 MG capsule Take 1 capsule (500 mg total) by mouth 3 (three) times daily. 21 capsule Raylene Everts, MD      PDMP not reviewed this encounter.   Raylene Everts, MD 05/23/22 Lurline Hare

## 2022-05-23 NOTE — Discharge Instructions (Signed)
Continue warm soaks for 20 minutes 2 or 3 times a day Take the Keflex antibiotic 3 times a day Call or return for problems

## 2022-05-23 NOTE — ED Triage Notes (Signed)
Pain,  redness  & swelling to right ring finger  Torn  cuticle noted on Sunday  Epsom salt soaks  BID  Ibuprofen last night & this am  Finger is throbbing

## 2022-05-24 ENCOUNTER — Ambulatory Visit: Payer: 59 | Admitting: Family

## 2022-05-24 ENCOUNTER — Telehealth: Payer: Self-pay | Admitting: Emergency Medicine

## 2022-05-24 NOTE — Telephone Encounter (Signed)
Call to see how Latera was today - finger is no longer throbbing - able to sleep better. Thanked RN for the follow up call and care received yesterday from RN & Dr Meda Coffee

## 2022-06-05 DIAGNOSIS — F411 Generalized anxiety disorder: Secondary | ICD-10-CM | POA: Diagnosis not present

## 2022-06-17 ENCOUNTER — Other Ambulatory Visit: Payer: Self-pay | Admitting: Family Medicine

## 2022-06-17 ENCOUNTER — Other Ambulatory Visit (HOSPITAL_COMMUNITY): Payer: Self-pay

## 2022-06-19 ENCOUNTER — Other Ambulatory Visit (HOSPITAL_COMMUNITY): Payer: Self-pay

## 2022-06-19 DIAGNOSIS — F411 Generalized anxiety disorder: Secondary | ICD-10-CM | POA: Diagnosis not present

## 2022-06-26 ENCOUNTER — Telehealth: Payer: Self-pay | Admitting: Family Medicine

## 2022-06-26 ENCOUNTER — Other Ambulatory Visit (HOSPITAL_COMMUNITY): Payer: Self-pay

## 2022-06-26 ENCOUNTER — Other Ambulatory Visit: Payer: Self-pay | Admitting: Family Medicine

## 2022-06-26 MED ORDER — ALPRAZOLAM 0.25 MG PO TABS
0.2500 mg | ORAL_TABLET | Freq: Two times a day (BID) | ORAL | 1 refills | Status: AC | PRN
  Filled 2022-06-26: qty 20, 10d supply, fill #0

## 2022-06-26 NOTE — Telephone Encounter (Signed)
Patient is going out of town later today and is requesting one more refill on prescription. She was reminded that she needs a follow up appt and said she would schedule once she's back in town.    Medication: ALPRAZolam (XANAX) 0.25 MG tablet   Has the patient contacted their pharmacy? Yes.    Pharmacy advised she need an appt with provider on 06/17/22.    Preferred Pharmacy (with phone number or street name): [facility] [address], [place] [address] Phone: [phone]  Fax: [phone]   Agent: Please be advised that RX refills may take up to 3 business days. We ask that you follow-up with your pharmacy.

## 2022-06-26 NOTE — Telephone Encounter (Signed)
Medication was sent

## 2022-06-26 NOTE — Telephone Encounter (Signed)
Requesting: alprazolam 0.'25mg'$  Contract: 05/01/21 UDS: 05/01/21 Last Visit: 10/16/21 Next Visit: 03/27/22 with her Psychological

## 2022-07-10 DIAGNOSIS — F411 Generalized anxiety disorder: Secondary | ICD-10-CM | POA: Diagnosis not present

## 2022-07-11 ENCOUNTER — Other Ambulatory Visit (HOSPITAL_COMMUNITY): Payer: Self-pay

## 2022-07-12 ENCOUNTER — Other Ambulatory Visit (HOSPITAL_COMMUNITY): Payer: Self-pay

## 2022-08-06 DIAGNOSIS — Z1231 Encounter for screening mammogram for malignant neoplasm of breast: Secondary | ICD-10-CM | POA: Diagnosis not present

## 2022-08-06 LAB — HM MAMMOGRAPHY

## 2022-08-13 ENCOUNTER — Other Ambulatory Visit (HOSPITAL_COMMUNITY): Payer: Self-pay

## 2022-08-13 ENCOUNTER — Other Ambulatory Visit: Payer: Self-pay | Admitting: Family Medicine

## 2022-08-14 ENCOUNTER — Telehealth: Payer: Self-pay | Admitting: Family Medicine

## 2022-08-14 ENCOUNTER — Other Ambulatory Visit: Payer: Self-pay

## 2022-08-14 ENCOUNTER — Other Ambulatory Visit (HOSPITAL_COMMUNITY): Payer: Self-pay

## 2022-08-14 MED ORDER — CARVEDILOL 6.25 MG PO TABS
6.2500 mg | ORAL_TABLET | Freq: Two times a day (BID) | ORAL | 1 refills | Status: DC
  Filled 2022-08-14: qty 180, 90d supply, fill #0
  Filled 2022-11-15: qty 180, 90d supply, fill #1

## 2022-08-14 NOTE — Telephone Encounter (Signed)
Pt would like to know why carvedilol (COREG) 6.25 MG tablet was denied. She is leaving town for a funeral tomorrow and is needing rx. Please advise pt.

## 2022-08-14 NOTE — Telephone Encounter (Signed)
Medication was sent

## 2022-08-19 ENCOUNTER — Other Ambulatory Visit (HOSPITAL_COMMUNITY): Payer: Self-pay

## 2022-08-22 DIAGNOSIS — F411 Generalized anxiety disorder: Secondary | ICD-10-CM | POA: Diagnosis not present

## 2022-08-28 ENCOUNTER — Other Ambulatory Visit (HOSPITAL_BASED_OUTPATIENT_CLINIC_OR_DEPARTMENT_OTHER): Payer: Self-pay

## 2022-08-28 MED ORDER — FLUARIX QUADRIVALENT 0.5 ML IM SUSY
PREFILLED_SYRINGE | INTRAMUSCULAR | 0 refills | Status: AC
  Filled 2022-08-28: qty 0.5, 1d supply, fill #0

## 2022-08-28 MED ORDER — COMIRNATY 30 MCG/0.3ML IM SUSY
PREFILLED_SYRINGE | INTRAMUSCULAR | 0 refills | Status: AC
  Filled 2022-08-28: qty 0.3, 1d supply, fill #0

## 2022-09-24 ENCOUNTER — Other Ambulatory Visit (HOSPITAL_COMMUNITY): Payer: Self-pay

## 2022-11-15 ENCOUNTER — Other Ambulatory Visit (HOSPITAL_COMMUNITY): Payer: Self-pay

## 2023-02-28 ENCOUNTER — Ambulatory Visit: Payer: Commercial Managed Care - PPO | Admitting: Family Medicine

## 2023-02-28 ENCOUNTER — Encounter: Payer: Self-pay | Admitting: Family Medicine

## 2023-02-28 ENCOUNTER — Other Ambulatory Visit (HOSPITAL_COMMUNITY): Payer: Self-pay

## 2023-02-28 VITALS — BP 135/87 | HR 81 | Ht 65.0 in | Wt 219.0 lb

## 2023-02-28 DIAGNOSIS — M069 Rheumatoid arthritis, unspecified: Secondary | ICD-10-CM

## 2023-02-28 DIAGNOSIS — I10 Essential (primary) hypertension: Secondary | ICD-10-CM | POA: Diagnosis not present

## 2023-02-28 DIAGNOSIS — Z1329 Encounter for screening for other suspected endocrine disorder: Secondary | ICD-10-CM

## 2023-02-28 DIAGNOSIS — F419 Anxiety disorder, unspecified: Secondary | ICD-10-CM | POA: Diagnosis not present

## 2023-02-28 DIAGNOSIS — E538 Deficiency of other specified B group vitamins: Secondary | ICD-10-CM

## 2023-02-28 DIAGNOSIS — Z131 Encounter for screening for diabetes mellitus: Secondary | ICD-10-CM

## 2023-02-28 DIAGNOSIS — E559 Vitamin D deficiency, unspecified: Secondary | ICD-10-CM | POA: Diagnosis not present

## 2023-02-28 DIAGNOSIS — Z13220 Encounter for screening for lipoid disorders: Secondary | ICD-10-CM

## 2023-02-28 MED ORDER — MIRTAZAPINE 7.5 MG PO TABS
7.5000 mg | ORAL_TABLET | Freq: Every day | ORAL | 3 refills | Status: DC
Start: 2023-02-28 — End: 2023-07-15
  Filled 2023-02-28: qty 30, 30d supply, fill #0
  Filled 2023-04-22 (×2): qty 30, 30d supply, fill #1
  Filled 2023-06-06: qty 30, 30d supply, fill #2

## 2023-02-28 MED ORDER — CARVEDILOL 6.25 MG PO TABS
6.2500 mg | ORAL_TABLET | Freq: Two times a day (BID) | ORAL | 1 refills | Status: DC
  Filled 2023-02-28: qty 180, 90d supply, fill #0

## 2023-02-28 NOTE — Progress Notes (Unsigned)
Complete physical exam  Patient: Miranda Orozco   DOB: 12/17/1960   62 y.o. Female  MRN: [id number]  Subjective:    Chief Complaint  Patient presents with   Establish Care    Patient is here to establish care.     Heiress Fanti is a 62 y.o. female who presents today for a complete physical exam. She reports consuming a {diet types:17450} diet. {types:19826} She generally feels {DESC; WELL/FAIRLY WELL/POORLY:18703}. She reports sleeping {DESC; WELL/FAIRLY WELL/POORLY:18703}. She {does/does not:200015} have additional problems to discuss today.    Most recent fall risk assessment:    02/28/2023   11:09 AM  Fall Risk   Falls in the past year? 0  Number falls in past yr: 0  Injury with Fall? 0  Risk for fall due to : No Fall Risks  Follow up Falls evaluation completed     Most recent depression screenings:    02/28/2023   11:10 AM 05/01/2020    2:32 PM  PHQ 2/9 Scores  PHQ - 2 Score 2 0  PHQ- 9 Score 7 1    {VISON DENTAL STD PSA (Optional):27386}  Patient Care Team: Del Newman Nip, Tenna Child, FNP as PCP - General (Family Medicine) Cyndia Bent, MD (Inactive) (General Surgery) Magrinat, Valentino Hue, MD (Inactive) (Hematology and Oncology) Pennie Rushing, Maris Berger, MD (Inactive) (Obstetrics and Gynecology) Pollyann Savoy, MD (Rheumatology) Charna Elizabeth, MD as Consulting Physician (Gastroenterology)   Outpatient Medications Prior to Visit  Medication Sig   aspirin EC 81 MG tablet Take 1 tablet (81 mg total) by mouth daily. Swallow whole.   carvedilol (COREG) 6.25 MG tablet Take 1 tablet (6.25 mg total) by mouth 2 (two) times daily with a meal.   atorvastatin (LIPITOR) 20 MG tablet Take 1 tablet (20 mg total) by mouth at bedtime. (Patient not taking: Reported on 02/28/2023)   cephALEXin (KEFLEX) 500 MG capsule Take 1 capsule (500 mg total) by mouth 3 (three) times daily.   COVID-19 mRNA bivalent vaccine, Pfizer, (PFIZER COVID-19 VAC BIVALENT) injection Inject into the muscle.    COVID-19 mRNA vaccine 2023-2024 (COMIRNATY) syringe Inject into the muscle.   glucose blood test strip Use as instructed. Dispense based on patient and insurance preference. Use up to four times daily as directed. Dz:E11.9  Check BS TID prn (Patient taking differently: 1 each by Other route as needed for other (Use to check blood sugar 3 times a day). Use as instructed. Dispense based on patient and insurance preference. Use up to four times daily as dirUse to check blood sugar 3 times a dayected. Dz:E11.9  Check BS TID prn)   influenza vac split quadrivalent PF (FLUARIX QUADRIVALENT) 0.5 ML injection Inject into the muscle.   losartan (COZAAR) 25 MG tablet Take 1 tablet (25 mg total) by mouth daily.   No facility-administered medications prior to visit.    ROS     Objective:    BP 135/87   Pulse 81   Ht 5\' 5"  (1.651 m)   Wt 219 lb (99.3 kg)   SpO2 92%   BMI 36.44 kg/m  {Vitals History (Optional):23777}  Physical Exam   No results found for any visits on 02/28/23.    Assessment & Plan:    Routine Health Maintenance and Physical Exam  Immunization History  Administered Date(s) Administered   COVID-19, mRNA, vaccine(Comirnaty)12 years and older 08/28/2022   Influenza,inj,Quad PF,6+ Mos 05/17/2013, 05/01/2021, 08/28/2022   Influenza-Unspecified 04/19/2014   PFIZER Comirnaty(Gray Top)Covid-19 Tri-Sucrose Vaccine 11/10/2020   PFIZER(Purple Top)SARS-COV-2 Vaccination  10/14/2019, 11/08/2019, 06/05/2020   Pfizer Covid-19 Vaccine Bivalent Booster 73yrs & up 09/14/2021   Td 08/05/2008   Tdap 11/10/2020   Zoster Recombinant(Shingrix) 10/06/2018, 01/11/2019    Health Maintenance  Topic Date Due   PAP SMEAR-Modifier  03/16/2021   COVID-19 Vaccine (7 - 2023-24 season) 04/14/2023 (Originally 10/23/2022)   INFLUENZA VACCINE  03/06/2023   MAMMOGRAM  08/06/2024   Colonoscopy  08/30/2026   DTaP/Tdap/Td (3 - Td or Tdap) 11/11/2030   Zoster Vaccines- Shingrix  Completed   HPV VACCINES   Aged Out   Hepatitis C Screening  Discontinued   HIV Screening  Discontinued    Discussed health benefits of physical activity, and encouraged her to engage in regular exercise appropriate for her age and condition.  Primary hypertension  Screening for diabetes mellitus  Screening for lipid disorders  Screening for thyroid disorder  Vitamin D deficiency  Rheumatoid arthritis, involving unspecified site, unspecified whether rheumatoid factor present (HCC)  Vitamin B12 deficiency    Return in about 1 week (around 03/07/2023), or if symptoms worsen or fail to improve, for Pap smear.     Cruzita Lederer Newman Nip, FNP

## 2023-02-28 NOTE — Patient Instructions (Signed)

## 2023-02-28 NOTE — Progress Notes (Deleted)
   New Patient Office Visit   Subjective   Patient ID: Miranda Orozco, female    DOB: Feb 19, 1961  Age: 62 y.o. MRN: [id number]  CC:  Chief Complaint  Patient presents with   Establish Care    Patient is here to establish care.     HPI Miranda Orozco presents to establish care. She  has a past medical history of Arthritis, rheumatic, acute or subacute (10/02/2009), BREAST CANCER, HX OF (10/02/2009), Disorder of bone and cartilage (10/02/2009), Esophageal reflux (10/02/2009), Hives (09/18/2014), MALIGNANT NEOPLASM OF BREAST UNSPECIFIED SITE (10/02/2009), Morbid (severe) obesity due to excess calories (HCC) (08/20/2021), Obesity, and OSTEOPENIA (10/02/2009).  HPI  ***  Outpatient Encounter Medications as of 02/28/2023  Medication Sig   aspirin EC 81 MG tablet Take 1 tablet (81 mg total) by mouth daily. Swallow whole.   carvedilol (COREG) 6.25 MG tablet Take 1 tablet (6.25 mg total) by mouth 2 (two) times daily with a meal.   atorvastatin (LIPITOR) 20 MG tablet Take 1 tablet (20 mg total) by mouth at bedtime. (Patient not taking: Reported on 02/28/2023)   cephALEXin (KEFLEX) 500 MG capsule Take 1 capsule (500 mg total) by mouth 3 (three) times daily.   COVID-19 mRNA bivalent vaccine, Pfizer, (PFIZER COVID-19 VAC BIVALENT) injection Inject into the muscle.   COVID-19 mRNA vaccine 2023-2024 (COMIRNATY) syringe Inject into the muscle.   glucose blood test strip Use as instructed. Dispense based on patient and insurance preference. Use up to four times daily as directed. Dz:E11.9  Check BS TID prn (Patient taking differently: 1 each by Other route as needed for other (Use to check blood sugar 3 times a day). Use as instructed. Dispense based on patient and insurance preference. Use up to four times daily as dirUse to check blood sugar 3 times a dayected. Dz:E11.9  Check BS TID prn)   influenza vac split quadrivalent PF (FLUARIX QUADRIVALENT) 0.5 ML injection Inject into the muscle.   losartan (COZAAR) 25 MG  tablet Take 1 tablet (25 mg total) by mouth daily.   No facility-administered encounter medications on file as of 02/28/2023.    Past Surgical History:  Procedure Laterality Date   BREAST LUMPECTOMY  03/20/2010   for margin   BREAST LUMPECTOMY  03/26/2010   for margin   CERVICAL CONIZATION W/BX Bilateral 02/06/2018   Procedure: CONIZATION CERVIX WITH BIOPSY;  Surgeon: Hal Morales, MD;  Location: [facility];  Service: Gynecology;  Laterality: Bilateral;   CESAREAN SECTION     1996, 2001 x2   COLONSCOPY  2018   DILATATION & CURRETTAGE/HYSTEROSCOPY WITH RESECTOCOPE N/A 02/19/2013   Procedure: DILATATION & CURETTAGE/HYSTEROSCOPY WITH RESECTION OF POLYP;  Surgeon: Hal Morales, MD;  Location: [facility];  Service: Gynecology;  Laterality: N/A;   MASTECTOMY PARTIAL / LUMPECTOMY W/ AXILLARY LYMPHADENECTOMY  03/12/2010   Right w SLN Dr Jamey Ripa   SIMPLE MASTECTOMY  03/31/2011   For margin    ROS    Objective    BP 135/87   Pulse 81   Ht 5\' 5"  (1.651 m)   Wt 219 lb (99.3 kg)   SpO2 92%   BMI 36.44 kg/m   Physical Exam    Assessment & Plan:  There are no diagnoses linked to this encounter.  No follow-ups on file.   Cruzita Lederer Newman Nip, FNP

## 2023-03-03 NOTE — Assessment & Plan Note (Signed)
    02/28/2023   11:13 AM  GAD 7 : Generalized Anxiety Score  Nervous, Anxious, on Edge 2  Control/stop worrying 3  Worry too much - different things 3  Trouble relaxing 3  Restless 0  Easily annoyed or irritable 3  Afraid - awful might happen 1  Total GAD 7 Score 15  Anxiety Difficulty Very difficult   Anxiety with insomnia Trial on Remeron 7.5 mg at bedtime Discussed about cognitive behavioral therapy focusing on thoughts, belief, and attitudes that affects feelings and behavior, learning about coping skills to deal with certain problems. Maintaining a consistent routine and schedule, Practice stress management and self calming techniques, excersise regularly and spend time outdoors, Do not eat food that are high in fat, added sugar, or salt. Follow up in 6 weeks Patient denied referral to behavior health services

## 2023-03-03 NOTE — Progress Notes (Signed)
New Patient Office Visit   Subjective   Patient ID: Calirae Tienda, female    DOB: September 06, 1960  Age: 62 y.o. MRN: [id number]  CC:  Chief Complaint  Patient presents with   Establish Care    Patient is here to establish care.     HPI Chalise Schweers presents to the clinic for HTN management, insomnia and anxiety She  has a past medical history of Arthritis, rheumatic, acute or subacute (10/02/2009), BREAST CANCER, HX OF (10/02/2009), Disorder of bone and cartilage (10/02/2009), Esophageal reflux (10/02/2009), Hives (09/18/2014), MALIGNANT NEOPLASM OF BREAST UNSPECIFIED SITE (10/02/2009), Morbid (severe) obesity due to excess calories (HCC) (08/20/2021), Obesity, and OSTEOPENIA (10/02/2009).For the details of today's visit, please refer to assessment and plan.   HPI    Outpatient Encounter Medications as of 02/28/2023  Medication Sig   aspirin EC 81 MG tablet Take 1 tablet (81 mg total) by mouth daily. Swallow whole.   mirtazapine (REMERON) 7.5 MG tablet Take 1 tablet (7.5 mg total) by mouth at bedtime.   [DISCONTINUED] carvedilol (COREG) 6.25 MG tablet Take 1 tablet (6.25 mg total) by mouth 2 (two) times daily with a meal.   atorvastatin (LIPITOR) 20 MG tablet Take 1 tablet (20 mg total) by mouth at bedtime. (Patient not taking: Reported on 02/28/2023)   carvedilol (COREG) 6.25 MG tablet Take 1 tablet (6.25 mg total) by mouth 2 (two) times daily with a meal.   cephALEXin (KEFLEX) 500 MG capsule Take 1 capsule (500 mg total) by mouth 3 (three) times daily.   COVID-19 mRNA bivalent vaccine, Pfizer, (PFIZER COVID-19 VAC BIVALENT) injection Inject into the muscle.   COVID-19 mRNA vaccine 2023-2024 (COMIRNATY) syringe Inject into the muscle.   glucose blood test strip Use as instructed. Dispense based on patient and insurance preference. Use up to four times daily as directed. Dz:E11.9  Check BS TID prn (Patient taking differently: 1 each by Other route as needed for other (Use to check blood sugar 3  times a day). Use as instructed. Dispense based on patient and insurance preference. Use up to four times daily as dirUse to check blood sugar 3 times a dayected. Dz:E11.9  Check BS TID prn)   influenza vac split quadrivalent PF (FLUARIX QUADRIVALENT) 0.5 ML injection Inject into the muscle.   losartan (COZAAR) 25 MG tablet Take 1 tablet (25 mg total) by mouth daily.   No facility-administered encounter medications on file as of 02/28/2023.    Past Surgical History:  Procedure Laterality Date   BREAST LUMPECTOMY  03/20/2010   for margin   BREAST LUMPECTOMY  03/26/2010   for margin   CERVICAL CONIZATION W/BX Bilateral 02/06/2018   Procedure: CONIZATION CERVIX WITH BIOPSY;  Surgeon: Hal Morales, MD;  Location: [facility];  Service: Gynecology;  Laterality: Bilateral;   CESAREAN SECTION     1996, 2001 x2   COLONSCOPY  2018   DILATATION & CURRETTAGE/HYSTEROSCOPY WITH RESECTOCOPE N/A 02/19/2013   Procedure: DILATATION & CURETTAGE/HYSTEROSCOPY WITH RESECTION OF POLYP;  Surgeon: Hal Morales, MD;  Location: [facility];  Service: Gynecology;  Laterality: N/A;   MASTECTOMY PARTIAL / LUMPECTOMY W/ AXILLARY LYMPHADENECTOMY  03/12/2010   Right w SLN Dr Jamey Ripa   SIMPLE MASTECTOMY  03/31/2011   For margin    Review of Systems  Constitutional:  Negative for chills and fever.  Eyes:  Negative for blurred vision.  Respiratory:  Negative for shortness of breath.   Cardiovascular:  Negative for chest pain.  Neurological:  Negative  for dizziness and headaches.      Objective    BP 135/87   Pulse 81   Ht 5\' 5"  (1.651 m)   Wt 219 lb (99.3 kg)   SpO2 92%   BMI 36.44 kg/m   Physical Exam Vitals reviewed.  Constitutional:      General: She is not in acute distress.    Appearance: Normal appearance. She is not ill-appearing, toxic-appearing or diaphoretic.  HENT:     Head: Normocephalic.     Nose: Nose normal.     Mouth/Throat:     Mouth: Mucous membranes are moist.   Eyes:     General:        Right eye: No discharge.        Left eye: No discharge.     Conjunctiva/sclera: Conjunctivae normal.     Pupils: Pupils are equal, round, and reactive to light.  Cardiovascular:     Rate and Rhythm: Normal rate.     Pulses: Normal pulses.     Heart sounds: Normal heart sounds.  Pulmonary:     Effort: Pulmonary effort is normal. No respiratory distress.     Breath sounds: Normal breath sounds.  Abdominal:     General: Bowel sounds are normal.     Palpations: Abdomen is soft.     Tenderness: There is no abdominal tenderness. There is no right CVA tenderness, left CVA tenderness or guarding.  Musculoskeletal:        General: Normal range of motion.     Cervical back: Normal range of motion.  Skin:    General: Skin is warm and dry.     Capillary Refill: Capillary refill takes less than 2 seconds.  Neurological:     General: No focal deficit present.     Mental Status: She is alert and oriented to person, place, and time.     Coordination: Coordination normal.     Gait: Gait normal.  Psychiatric:        Mood and Affect: Mood normal.        Behavior: Behavior normal.       Assessment & Plan:  Primary hypertension Assessment & Plan: Vitals:   02/28/23 1103  BP: 135/87  Blood pressure controlled in today's visit Continue Carvedilol 6.25 mg twice daily Continued discussion on DASH diet, low sodium diet and maintain a exercise routine for 150 minutes per week.   Orders: -     CBC with Differential/Platelet -     BMP8+EGFR -     Microalbumin / creatinine urine ratio  Screening for diabetes mellitus -     Hemoglobin A1c  Screening for lipid disorders -     Lipid panel  Screening for thyroid disorder -     TSH + free T4 -     Thyroid Peroxidase Antibodies (TPO) (REFL)  Vitamin D deficiency -     VITAMIN D 25 Hydroxy (Vit-D Deficiency, Fractures)  Rheumatoid arthritis, involving unspecified site, unspecified whether rheumatoid factor present  (HCC) -     Cyclic citrul peptide antibody, IgG -     ANA w/Reflex -     CYCLIC CITRUL PEPTIDE ANTIBODY, IGG/IGA  Vitamin B12 deficiency -     Vitamin B12  Anxiety Assessment & Plan:    02/28/2023   11:13 AM  GAD 7 : Generalized Anxiety Score  Nervous, Anxious, on Edge 2  Control/stop worrying 3  Worry too much - different things 3  Trouble relaxing 3  Restless 0  Easily  annoyed or irritable 3  Afraid - awful might happen 1  Total GAD 7 Score 15  Anxiety Difficulty Very difficult   Anxiety with insomnia Trial on Remeron 7.5 mg at bedtime Discussed about cognitive behavioral therapy focusing on thoughts, belief, and attitudes that affects feelings and behavior, learning about coping skills to deal with certain problems. Maintaining a consistent routine and schedule, Practice stress management and self calming techniques, excersise regularly and spend time outdoors, Do not eat food that are high in fat, added sugar, or salt. Follow up in 6 weeks Patient denied referral to behavior health services    Orders: -     Mirtazapine; Take 1 tablet (7.5 mg total) by mouth at bedtime.  Dispense: 30 tablet; Refill: 3  Other orders -     Carvedilol; Take 1 tablet (6.25 mg total) by mouth 2 (two) times daily with a meal.  Dispense: 180 tablet; Refill: 1    Return in about 1 week (around 03/07/2023) for 1 week for pap smear, weight loss and 6 weeks for insonmia, anxiety .   Cruzita Lederer Newman Nip, FNP

## 2023-03-03 NOTE — Assessment & Plan Note (Signed)
Vitals:   02/28/23 1103  BP: 135/87  Blood pressure controlled in today's visit Continue Carvedilol 6.25 mg twice daily Continued discussion on DASH diet, low sodium diet and maintain a exercise routine for 150 minutes per week.

## 2023-03-04 DIAGNOSIS — Z131 Encounter for screening for diabetes mellitus: Secondary | ICD-10-CM | POA: Diagnosis not present

## 2023-03-04 DIAGNOSIS — M069 Rheumatoid arthritis, unspecified: Secondary | ICD-10-CM | POA: Diagnosis not present

## 2023-03-04 DIAGNOSIS — E559 Vitamin D deficiency, unspecified: Secondary | ICD-10-CM | POA: Diagnosis not present

## 2023-03-04 DIAGNOSIS — E538 Deficiency of other specified B group vitamins: Secondary | ICD-10-CM | POA: Diagnosis not present

## 2023-03-04 DIAGNOSIS — Z1329 Encounter for screening for other suspected endocrine disorder: Secondary | ICD-10-CM | POA: Diagnosis not present

## 2023-03-04 DIAGNOSIS — I10 Essential (primary) hypertension: Secondary | ICD-10-CM | POA: Diagnosis not present

## 2023-03-04 DIAGNOSIS — Z13220 Encounter for screening for lipoid disorders: Secondary | ICD-10-CM | POA: Diagnosis not present

## 2023-03-04 NOTE — Addendum Note (Signed)
Addended by: Rica Records on: 03/04/2023 09:01 PM   Modules accepted: Level of Service

## 2023-03-06 ENCOUNTER — Other Ambulatory Visit: Payer: Self-pay

## 2023-03-06 ENCOUNTER — Other Ambulatory Visit (HOSPITAL_COMMUNITY): Payer: Self-pay

## 2023-03-06 ENCOUNTER — Other Ambulatory Visit: Payer: Self-pay | Admitting: Family Medicine

## 2023-03-06 DIAGNOSIS — Z131 Encounter for screening for diabetes mellitus: Secondary | ICD-10-CM | POA: Diagnosis not present

## 2023-03-06 MED ORDER — METFORMIN HCL 500 MG PO TABS
500.0000 mg | ORAL_TABLET | Freq: Every day | ORAL | 1 refills | Status: DC
  Filled 2023-03-06: qty 90, 90d supply, fill #0

## 2023-03-06 MED ORDER — VITAMIN D3 25 MCG (1000 UT) PO CAPS
1000.0000 [IU] | ORAL_CAPSULE | Freq: Every day | ORAL | 1 refills | Status: AC
  Filled 2023-03-06: qty 90, 90d supply, fill #0

## 2023-03-06 MED ORDER — ATORVASTATIN CALCIUM 40 MG PO TABS
40.0000 mg | ORAL_TABLET | Freq: Every day | ORAL | 1 refills | Status: DC
  Filled 2023-03-06: qty 90, 90d supply, fill #0

## 2023-03-07 ENCOUNTER — Other Ambulatory Visit: Payer: Self-pay | Admitting: Family Medicine

## 2023-03-07 ENCOUNTER — Ambulatory Visit: Payer: Commercial Managed Care - PPO | Admitting: Family Medicine

## 2023-03-11 ENCOUNTER — Encounter: Payer: Commercial Managed Care - PPO | Admitting: Family

## 2023-03-12 ENCOUNTER — Other Ambulatory Visit (HOSPITAL_COMMUNITY): Payer: Self-pay

## 2023-03-12 ENCOUNTER — Other Ambulatory Visit (HOSPITAL_COMMUNITY)
Admission: RE | Admit: 2023-03-12 | Discharge: 2023-03-12 | Disposition: A | Discharge status: Home / Self Care | Payer: Commercial Managed Care - PPO | Source: Ambulatory Visit | Attending: Family Medicine | Admitting: Family Medicine

## 2023-03-12 ENCOUNTER — Ambulatory Visit (INDEPENDENT_AMBULATORY_CARE_PROVIDER_SITE_OTHER): Payer: Commercial Managed Care - PPO | Admitting: Family Medicine

## 2023-03-12 ENCOUNTER — Encounter: Payer: Self-pay | Admitting: Family Medicine

## 2023-03-12 VITALS — BP 129/82 | HR 81 | Ht 65.0 in | Wt 218.0 lb

## 2023-03-12 DIAGNOSIS — Z01411 Encounter for gynecological examination (general) (routine) with abnormal findings: Secondary | ICD-10-CM

## 2023-03-12 DIAGNOSIS — E119 Type 2 diabetes mellitus without complications: Secondary | ICD-10-CM

## 2023-03-12 DIAGNOSIS — E782 Mixed hyperlipidemia: Secondary | ICD-10-CM | POA: Diagnosis not present

## 2023-03-12 DIAGNOSIS — Z124 Encounter for screening for malignant neoplasm of cervix: Secondary | ICD-10-CM | POA: Insufficient documentation

## 2023-03-12 MED ORDER — SEMAGLUTIDE(0.25 OR 0.5MG/DOS) 2 MG/3ML ~~LOC~~ SOPN
0.5000 mg | PEN_INJECTOR | SUBCUTANEOUS | 0 refills | Status: DC
Start: 2023-03-12 — End: 2023-04-22
  Filled 2023-03-12: qty 3, 28d supply, fill #0

## 2023-03-12 NOTE — Progress Notes (Signed)
Patient Office Visit   Subjective   Patient ID: Miranda Orozco, female    DOB: April 28, 1961  Age: 62 y.o. MRN: [id number]  CC:  Chief Complaint  Patient presents with   Annual Exam   Weight Loss    Patient would like to discuss weight loss options.     HPI Miranda Orozco 62 year old female, presents to the clinic for cervical cancer screening. She  has a past medical history of Arthritis, rheumatic, acute or subacute (10/02/2009), BREAST CANCER, HX OF (10/02/2009), Disorder of bone and cartilage (10/02/2009), Esophageal reflux (10/02/2009), Hives (09/18/2014), MALIGNANT NEOPLASM OF BREAST UNSPECIFIED SITE (10/02/2009), Morbid (severe) obesity due to excess calories (HCC) (08/20/2021), Obesity, and OSTEOPENIA (10/02/2009).For the details of today's visit, please refer to assessment and plan.   HPI    Outpatient Encounter Medications as of 03/12/2023  Medication Sig   aspirin EC 81 MG tablet Take 1 tablet (81 mg total) by mouth daily. Swallow whole.   atorvastatin (LIPITOR) 40 MG tablet Take 1 tablet (40 mg total) by mouth daily.   carvedilol (COREG) 6.25 MG tablet Take 1 tablet (6.25 mg total) by mouth 2 (two) times daily with a meal.   cephALEXin (KEFLEX) 500 MG capsule Take 1 capsule (500 mg total) by mouth 3 (three) times daily.   Cholecalciferol (VITAMIN D3) 25 MCG (1000 UT) CAPS Take 1 capsule (1,000 Units total) by mouth daily.   COVID-19 mRNA bivalent vaccine, Pfizer, (PFIZER COVID-19 VAC BIVALENT) injection Inject into the muscle.   COVID-19 mRNA vaccine 2023-2024 (COMIRNATY) syringe Inject into the muscle.   glucose blood test strip Use as instructed. Dispense based on patient and insurance preference. Use up to four times daily as directed. Dz:E11.9  Check BS TID prn (Patient taking differently: 1 each by Other route as needed for other (Use to check blood sugar 3 times a day). Use as instructed. Dispense based on patient and insurance preference. Use up to four times daily as dirUse to  check blood sugar 3 times a dayected. Dz:E11.9  Check BS TID prn)   influenza vac split quadrivalent PF (FLUARIX QUADRIVALENT) 0.5 ML injection Inject into the muscle.   losartan (COZAAR) 25 MG tablet Take 1 tablet (25 mg total) by mouth daily.   metFORMIN (GLUCOPHAGE) 500 MG tablet Take 1 tablet (500 mg total) by mouth daily with breakfast.   mirtazapine (REMERON) 7.5 MG tablet Take 1 tablet (7.5 mg total) by mouth at bedtime.   Semaglutide,0.25 or 0.5MG /DOS, 2 MG/3ML SOPN Inject 0.5 mg into the skin once a week.   No facility-administered encounter medications on file as of 03/12/2023.    Past Surgical History:  Procedure Laterality Date   BREAST LUMPECTOMY  03/20/2010   for margin   BREAST LUMPECTOMY  03/26/2010   for margin   CERVICAL CONIZATION W/BX Bilateral 02/06/2018   Procedure: CONIZATION CERVIX WITH BIOPSY;  Surgeon: Hal Morales, MD;  Location: [facility];  Service: Gynecology;  Laterality: Bilateral;   CESAREAN SECTION     1996, 2001 x2   COLONSCOPY  2018   DILATATION & CURRETTAGE/HYSTEROSCOPY WITH RESECTOCOPE N/A 02/19/2013   Procedure: DILATATION & CURETTAGE/HYSTEROSCOPY WITH RESECTION OF POLYP;  Surgeon: Hal Morales, MD;  Location: [facility];  Service: Gynecology;  Laterality: N/A;   MASTECTOMY PARTIAL / LUMPECTOMY W/ AXILLARY LYMPHADENECTOMY  03/12/2010   Right w SLN Dr Jamey Ripa   SIMPLE MASTECTOMY  03/31/2011   For margin    Review of Systems  Constitutional:  Negative  for chills and fever.  Eyes:  Negative for blurred vision.  Respiratory:  Negative for shortness of breath.   Cardiovascular:  Negative for chest pain.  Gastrointestinal:  Negative for abdominal pain.  Neurological:  Negative for dizziness.      Objective    BP 129/82   Pulse 81   Ht 5\' 5"  (1.651 m)   Wt 218 lb (98.9 kg)   SpO2 96%   BMI 36.28 kg/m   Physical Exam Vitals reviewed.  Constitutional:      General: She is not in acute distress.    Appearance:  Normal appearance. She is not ill-appearing, toxic-appearing or diaphoretic.  HENT:     Head: Normocephalic.  Eyes:     General:        Right eye: No discharge.        Left eye: No discharge.     Conjunctiva/sclera: Conjunctivae normal.  Cardiovascular:     Rate and Rhythm: Normal rate.     Pulses: Normal pulses.  Pulmonary:     Effort: Pulmonary effort is normal. No respiratory distress.  Genitourinary:    General: Normal vulva.     Exam position: Lithotomy position.     Pubic Area: No rash.      Labia:        Right: No rash.        Left: No rash.      Vagina: Normal. No vaginal discharge.     Cervix: Normal.  Musculoskeletal:        General: Normal range of motion.     Cervical back: Normal range of motion.  Skin:    General: Skin is warm and dry.  Neurological:     Mental Status: She is alert.     Coordination: Coordination normal.     Gait: Gait normal.  Psychiatric:        Mood and Affect: Mood normal.        Behavior: Behavior normal.       Assessment & Plan:  Type 2 diabetes mellitus without complication, without long-term current use of insulin (HCC) -     Semaglutide(0.25 or 0.5MG /DOS); Inject 0.5 mg into the skin once a week.  Dispense: 3 mL; Refill: 0  Mixed hyperlipidemia -     AST -     ALT -     Alkaline phosphatase  Cervical cancer screening Assessment & Plan: Pap smear done, Patient tolerated procedure well. Informed patient I will keep them updated on results. Left breast appear normal, no suspicious masses, no skin or nipple changes or axillary nodes, Right post-mastectomy site well healed , surgical scars noted     Return in about 4 months (around 07/12/2023), or if symptoms worsen or fail to improve, for type 2 diabetes, hyperlipidemia, hypertension.   Cruzita Lederer Newman Nip, FNP

## 2023-03-12 NOTE — Addendum Note (Signed)
Addended by: Rica Records on: 03/12/2023 12:17 PM   Modules accepted: Orders

## 2023-03-12 NOTE — Assessment & Plan Note (Addendum)
Pap smear done, Patient tolerated procedure well. Informed patient I will keep them updated on results. Left breast appear normal, no suspicious masses, no skin or nipple changes or axillary nodes, Right post-mastectomy site well healed , surgical scars noted

## 2023-03-12 NOTE — Patient Instructions (Addendum)
        Great to see you today.  I have refilled the medication(s) we provide.   Ozempic medication management- Send mychart message week 3-4 so I can send next increased dosage    If labs were collected, we will inform you of lab results once received either by echart message or telephone call.   - echart message- for normal results that have been seen by the patient already.   - telephone call: abnormal results or if patient has not viewed results in their echart.   - Please take medications as prescribed. - Follow up with your primary health provider if any health concerns arises. - If symptoms worsen please contact your primary care provider and/or visit the emergency department.

## 2023-04-22 ENCOUNTER — Other Ambulatory Visit (HOSPITAL_COMMUNITY): Payer: Self-pay

## 2023-04-22 ENCOUNTER — Other Ambulatory Visit: Payer: Self-pay

## 2023-04-22 ENCOUNTER — Other Ambulatory Visit: Payer: Self-pay | Admitting: Family Medicine

## 2023-04-22 DIAGNOSIS — E119 Type 2 diabetes mellitus without complications: Secondary | ICD-10-CM

## 2023-04-22 MED ORDER — SEMAGLUTIDE-WEIGHT MANAGEMENT 1 MG/0.5ML ~~LOC~~ SOAJ
1.0000 mg | SUBCUTANEOUS | 0 refills | Status: DC
Start: 2023-04-22 — End: 2023-07-23
  Filled 2023-04-22: qty 2, 28d supply, fill #0

## 2023-04-23 ENCOUNTER — Other Ambulatory Visit (HOSPITAL_COMMUNITY): Payer: Self-pay

## 2023-04-23 ENCOUNTER — Ambulatory Visit: Payer: Self-pay | Admitting: Family Medicine

## 2023-04-24 ENCOUNTER — Other Ambulatory Visit (HOSPITAL_COMMUNITY): Payer: Self-pay

## 2023-04-28 ENCOUNTER — Other Ambulatory Visit (HOSPITAL_COMMUNITY): Payer: Self-pay

## 2023-04-29 ENCOUNTER — Other Ambulatory Visit (HOSPITAL_COMMUNITY): Payer: Self-pay

## 2023-05-03 ENCOUNTER — Other Ambulatory Visit (HOSPITAL_COMMUNITY): Payer: Self-pay

## 2023-05-23 ENCOUNTER — Other Ambulatory Visit (HOSPITAL_COMMUNITY): Payer: Self-pay

## 2023-06-05 ENCOUNTER — Other Ambulatory Visit: Payer: Self-pay

## 2023-06-10 ENCOUNTER — Other Ambulatory Visit (HOSPITAL_COMMUNITY): Payer: Self-pay

## 2023-06-21 ENCOUNTER — Encounter (HOSPITAL_COMMUNITY): Payer: Self-pay

## 2023-06-21 ENCOUNTER — Other Ambulatory Visit (HOSPITAL_COMMUNITY): Payer: Self-pay

## 2023-06-24 ENCOUNTER — Other Ambulatory Visit (HOSPITAL_COMMUNITY): Payer: Self-pay

## 2023-06-25 ENCOUNTER — Telehealth: Payer: Self-pay

## 2023-06-25 NOTE — Telephone Encounter (Signed)
Awaiting a return call

## 2023-06-27 NOTE — Telephone Encounter (Signed)
Med Berkley Harvey has been submitted. Waiting on decision

## 2023-07-04 ENCOUNTER — Other Ambulatory Visit (HOSPITAL_COMMUNITY): Payer: Self-pay

## 2023-07-04 ENCOUNTER — Other Ambulatory Visit: Payer: Self-pay

## 2023-07-14 NOTE — Patient Instructions (Signed)

## 2023-07-14 NOTE — Progress Notes (Unsigned)
   Established Patient Office Visit   Subjective  Patient ID: Miranda Orozco, female    DOB: 1961/07/21  Age: 62 y.o. MRN: [id number]  No chief complaint on file.   She  has a past medical history of Arthritis, rheumatic, acute or subacute (10/02/2009), BREAST CANCER, HX OF (10/02/2009), Disorder of bone and cartilage (10/02/2009), Esophageal reflux (10/02/2009), Hives (09/18/2014), MALIGNANT NEOPLASM OF BREAST UNSPECIFIED SITE (10/02/2009), Morbid (severe) obesity due to excess calories (HCC) (08/20/2021), Obesity, and OSTEOPENIA (10/02/2009).  HPI  ROS    Objective:     There were no vitals taken for this visit. {Vitals History (Optional):23777}  Physical Exam   No results found for any visits on 07/15/23.  The 10-year ASCVD risk score (Arnett DK, et al., 2019) is: 13.2%    Assessment & Plan:  There are no diagnoses linked to this encounter.  No follow-ups on file.   Cruzita Lederer Newman Nip, FNP

## 2023-07-15 ENCOUNTER — Encounter: Payer: Self-pay | Admitting: Family Medicine

## 2023-07-15 ENCOUNTER — Ambulatory Visit: Payer: BC Managed Care – PPO | Admitting: Family Medicine

## 2023-07-15 ENCOUNTER — Other Ambulatory Visit (HOSPITAL_COMMUNITY): Payer: Self-pay

## 2023-07-15 VITALS — BP 126/84 | HR 80 | Ht 65.0 in | Wt 220.0 lb

## 2023-07-15 DIAGNOSIS — Z7984 Long term (current) use of oral hypoglycemic drugs: Secondary | ICD-10-CM

## 2023-07-15 DIAGNOSIS — E1169 Type 2 diabetes mellitus with other specified complication: Secondary | ICD-10-CM

## 2023-07-15 DIAGNOSIS — E119 Type 2 diabetes mellitus without complications: Secondary | ICD-10-CM | POA: Insufficient documentation

## 2023-07-15 DIAGNOSIS — Z7985 Long-term (current) use of injectable non-insulin antidiabetic drugs: Secondary | ICD-10-CM

## 2023-07-15 DIAGNOSIS — F419 Anxiety disorder, unspecified: Secondary | ICD-10-CM | POA: Diagnosis not present

## 2023-07-15 DIAGNOSIS — I10 Essential (primary) hypertension: Secondary | ICD-10-CM

## 2023-07-15 MED ORDER — METFORMIN HCL 500 MG PO TABS
500.0000 mg | ORAL_TABLET | Freq: Every day | ORAL | 1 refills | Status: DC
  Filled 2023-07-15: qty 90, 90d supply, fill #0
  Filled 2024-02-26 – 2024-03-16 (×3): qty 90, 90d supply, fill #1

## 2023-07-15 MED ORDER — MIRTAZAPINE 7.5 MG PO TABS
7.5000 mg | ORAL_TABLET | Freq: Every day | ORAL | 3 refills | Status: DC
Start: 2023-07-15 — End: 2024-06-15
  Filled 2023-07-15: qty 30, 30d supply, fill #0
  Filled 2023-10-17: qty 30, 30d supply, fill #1
  Filled 2024-02-26 – 2024-03-16 (×3): qty 30, 30d supply, fill #2
  Filled 2024-04-28: qty 30, 30d supply, fill #3

## 2023-07-15 MED ORDER — ATORVASTATIN CALCIUM 40 MG PO TABS
40.0000 mg | ORAL_TABLET | ORAL | 1 refills | Status: DC
  Filled 2023-07-15: qty 90, 90d supply, fill #0
  Filled 2024-02-26 – 2024-03-16 (×3): qty 90, 90d supply, fill #1

## 2023-07-15 MED ORDER — CARVEDILOL 6.25 MG PO TABS
6.2500 mg | ORAL_TABLET | Freq: Two times a day (BID) | ORAL | 1 refills | Status: DC
  Filled 2023-07-15: qty 180, 90d supply, fill #0
  Filled 2023-08-06 – 2023-12-02 (×2): qty 180, 90d supply, fill #1

## 2023-07-15 MED ORDER — LOSARTAN POTASSIUM 25 MG PO TABS
25.0000 mg | ORAL_TABLET | ORAL | 3 refills | Status: AC
  Filled 2023-07-15: qty 30, 30d supply, fill #0
  Filled 2023-08-09: qty 30, 30d supply, fill #1
  Filled 2024-02-26 – 2024-03-16 (×3): qty 30, 30d supply, fill #2
  Filled 2024-06-14: qty 30, 30d supply, fill #3

## 2023-07-15 MED ORDER — DEXCOM G7 SENSOR MISC
2 refills | Status: AC
Start: 2023-07-15 — End: ?
  Filled 2023-07-15: qty 1, 10d supply, fill #0
  Filled 2023-08-06 – 2023-08-09 (×2): qty 1, 10d supply, fill #1

## 2023-07-15 NOTE — Assessment & Plan Note (Addendum)
Vitals:   07/15/23 1143  BP: 126/84  Continue Carvedilol 6.25 mg twice daily, Losartan Daily Labs ordered. Discussed with  patient to monitor their blood pressure regularly and maintain a heart-healthy diet rich in fruits, vegetables, whole grains, and low-fat dairy, while reducing sodium intake to less than 2,300 mg per day. Regular physical activity, such as 30 minutes of moderate exercise most days of the week, will help lower blood pressure and improve overall cardiovascular health. Avoiding smoking, limiting alcohol consumption, and managing stress. Take  prescribed medication, & take it as directed and avoid skipping doses. Seek emergency care if your blood pressure is (over 180/100) or you experience chest pain, shortness of breath, or sudden vision changes.Patient verbalizes understanding regarding plan of care and all questions answered. Follow up in   weeks with at home blood pressure to monitor trends.

## 2023-07-15 NOTE — Assessment & Plan Note (Addendum)
Last Hemoglobin A1c: 7.5 Labs: Ordered today, results pending; will follow up accordingly. The patient reports adhering to prescribed medications: Metformin 500 mg daily and Ozempic weekly injection  Reviewed non-pharmacological interventions, including a balanced diet rich in lean proteins, healthy fats, whole grains, and high-fiber vegetables. Emphasized reducing refined sugars and processed carbohydrates, and incorporating more fruits, leafy greens, and legumes. Education: Patient was educated on recognizing signs and symptoms of both hypoglycemia and hyperglycemia, and advised to seek emergency care if these symptoms occur. Follow-Up: Scheduled for follow-up in 3-4 months, or sooner if needed. Patient Understanding: The patient verbalized understanding of the care plan, and all questions were answered. Additional Care: Ophthalmology referral was placed. Foot exam results were within normal limits.

## 2023-07-16 ENCOUNTER — Other Ambulatory Visit (HOSPITAL_COMMUNITY): Payer: Self-pay

## 2023-07-16 ENCOUNTER — Telehealth: Payer: Self-pay

## 2023-07-17 ENCOUNTER — Other Ambulatory Visit (HOSPITAL_COMMUNITY): Payer: Self-pay

## 2023-07-21 NOTE — Telephone Encounter (Signed)
Pt. Notified.

## 2023-07-22 ENCOUNTER — Encounter (HOSPITAL_COMMUNITY): Payer: Self-pay

## 2023-07-22 ENCOUNTER — Other Ambulatory Visit (HOSPITAL_COMMUNITY): Payer: Self-pay

## 2023-07-23 ENCOUNTER — Other Ambulatory Visit (HOSPITAL_COMMUNITY): Payer: Self-pay

## 2023-07-23 ENCOUNTER — Other Ambulatory Visit: Payer: Self-pay | Admitting: Family Medicine

## 2023-07-23 MED ORDER — SEMAGLUTIDE (1 MG/DOSE) 4 MG/3ML ~~LOC~~ SOPN
1.0000 mg | PEN_INJECTOR | SUBCUTANEOUS | 0 refills | Status: AC
  Filled 2023-07-23: qty 3, 28d supply, fill #0

## 2023-07-23 NOTE — Telephone Encounter (Signed)
She is saying she was prescribed ozempic but it was wegovy that was called in and approved. Please advise

## 2023-07-23 NOTE — Telephone Encounter (Signed)
I apologize for the oversight. I intended to prescribe Ozempic, but the previous prescription was for [facility]. Does the patient need a new a refill? I can send in Ozempic 1 mg

## 2023-07-25 ENCOUNTER — Other Ambulatory Visit (HOSPITAL_COMMUNITY): Payer: Self-pay

## 2023-07-25 ENCOUNTER — Telehealth: Payer: Self-pay

## 2023-07-28 NOTE — Telephone Encounter (Signed)
PA for medication approved.

## 2023-08-07 ENCOUNTER — Other Ambulatory Visit (HOSPITAL_COMMUNITY): Payer: Self-pay

## 2023-08-07 ENCOUNTER — Other Ambulatory Visit: Payer: Self-pay

## 2023-08-08 DIAGNOSIS — Z1231 Encounter for screening mammogram for malignant neoplasm of breast: Secondary | ICD-10-CM | POA: Diagnosis not present

## 2023-08-08 LAB — HM MAMMOGRAPHY

## 2023-08-09 ENCOUNTER — Other Ambulatory Visit (HOSPITAL_COMMUNITY): Payer: Self-pay

## 2023-08-11 ENCOUNTER — Encounter: Payer: Self-pay | Admitting: Family Medicine

## 2023-08-15 ENCOUNTER — Other Ambulatory Visit (HOSPITAL_COMMUNITY): Payer: Self-pay

## 2023-11-14 ENCOUNTER — Ambulatory Visit: Payer: BC Managed Care – PPO | Admitting: Family Medicine

## 2023-12-02 ENCOUNTER — Other Ambulatory Visit (HOSPITAL_COMMUNITY): Payer: Self-pay

## 2024-02-26 ENCOUNTER — Other Ambulatory Visit (HOSPITAL_COMMUNITY): Payer: Self-pay

## 2024-02-26 ENCOUNTER — Other Ambulatory Visit: Payer: Self-pay | Admitting: Family Medicine

## 2024-02-26 ENCOUNTER — Other Ambulatory Visit: Payer: Self-pay

## 2024-02-26 MED ORDER — CARVEDILOL 6.25 MG PO TABS
6.2500 mg | ORAL_TABLET | Freq: Two times a day (BID) | ORAL | 1 refills | Status: AC
  Filled 2024-02-26 – 2024-03-16 (×3): qty 180, 90d supply, fill #0
  Filled 2024-06-14: qty 180, 90d supply, fill #1

## 2024-03-08 ENCOUNTER — Other Ambulatory Visit (HOSPITAL_COMMUNITY): Payer: Self-pay

## 2024-03-16 ENCOUNTER — Other Ambulatory Visit (HOSPITAL_COMMUNITY): Payer: Self-pay

## 2024-03-16 ENCOUNTER — Other Ambulatory Visit (HOSPITAL_BASED_OUTPATIENT_CLINIC_OR_DEPARTMENT_OTHER): Payer: Self-pay

## 2024-04-19 ENCOUNTER — Ambulatory Visit: Payer: Self-pay

## 2024-04-19 NOTE — Telephone Encounter (Signed)
 FYI Only or Action Required?: FYI only for provider.  Patient was last seen in primary care on 07/15/2023 by Miranda Wilhelmena Lloyd Hilario, FNP.  Called Nurse Triage reporting Joint Swelling.  Symptoms began 10 days ago.  Interventions attempted: Rest, hydration, or home remedies.  Symptoms are: rapidly improving.  Triage Disposition: See PCP When Office is Open (Within 3 Days)  Patient/caregiver understands and will follow disposition?: Yes Reason for Disposition  MODERATE ankle swelling (e.g., interferes with normal activities, can't move joint normally) (Exceptions: Itchy, localized swelling; swelling is chronic.)  Answer Assessment - Initial Assessment Questions 1. LOCATION: Which ankle is swollen? Where is the swelling?     Left ankle, is improving  2. ONSET: When did the swelling start?     10 days  3. SWELLING:     Yes, but improving  4. PAIN: Is there any pain? If Yes, ask: How bad is it? (Scale 0-10; or none, mild, moderate, severe)     Mild  5. CAUSE: What do you think caused the ankle swelling?     Unknown  Protocols used: Ankle Swelling-A-AH Copied from CRM [id number]. Topic: Clinical - Red Word Triage >> Apr 19, 2024  3:33 PM Miranda Orozco wrote: Red Word that prompted transfer to Nurse Triage: Pt is reporting swelling in  her left ankle. Pt is requesting labs and bp check

## 2024-04-19 NOTE — Telephone Encounter (Signed)
 Patient scheduled.

## 2024-04-20 ENCOUNTER — Encounter: Payer: Self-pay | Admitting: Family Medicine

## 2024-04-20 ENCOUNTER — Ambulatory Visit: Admitting: Family Medicine

## 2024-04-20 VITALS — BP 119/79 | HR 71 | Temp 98.1°F | Ht 65.0 in | Wt 207.1 lb

## 2024-04-20 DIAGNOSIS — M25472 Effusion, left ankle: Secondary | ICD-10-CM

## 2024-04-20 NOTE — Assessment & Plan Note (Addendum)
 Low suspicion for DVT as negative homan's and no added risk factors. Lungs are clear bilaterally and she has no SOB or orthopnea. My highest suspicion is MSK injury however will obtain CMP. Encouraged to rest, ice, elevate, use NSAIDs PRN, and avoid aggravating activities. Follow up if symptoms persist or worsen.

## 2024-04-20 NOTE — Progress Notes (Signed)
 Subjective:  HPI: Miranda Orozco is a 63 y.o. female presenting on 04/20/2024 for Acute Visit (Left Ankle edema x 10 days )   HPI Patient is in today for lower extremity edema. Ms Juliane is a patient of Ms Terry Wilhelmena Falter at [facility] and was unable to get an appt with her PCP. Today she reports 10 days edema in her left ankle. She tripped when she was on vacation but it didn't hurt or bruise, no redness or warmth, calf pain. No history of CHF, kidney disease, swelling or DVT, SOB, orthopnea. Has not been consuming large amounts of sodium. The swelling is overall improving since onset.  Review of Systems  All other systems reviewed and are negative.   Relevant past medical history reviewed and updated as indicated.   Past Medical History:  Diagnosis Date   Arthritis, rheumatic, acute or subacute 10/02/2009   RA   BREAST CANCER, HX OF 10/02/2009   Disorder of bone and cartilage 10/02/2009   Qualifier: Diagnosis of  By: Mavis MD, John E    Esophageal reflux 10/02/2009   Hives 09/18/2014   MALIGNANT NEOPLASM OF BREAST UNSPECIFIED SITE 10/02/2009   RIGHT CHMOE AND RADIATION DONE   Morbid (severe) obesity due to excess calories (HCC) 08/20/2021   Obesity    OSTEOPENIA 10/02/2009     Past Surgical History:  Procedure Laterality Date   BREAST LUMPECTOMY  03/20/2010   for margin   BREAST LUMPECTOMY  03/26/2010   for margin   CERVICAL CONIZATION W/BX Bilateral 02/06/2018   Procedure: CONIZATION CERVIX WITH BIOPSY;  Surgeon: Raeanne Shanda SQUIBB, MD;  Location: [facility] [place];  Service: Gynecology;  Laterality: Bilateral;   CESAREAN SECTION     1996, 2001 x2   COLONSCOPY  2018   DILATATION & CURRETTAGE/HYSTEROSCOPY WITH RESECTOCOPE N/A 02/19/2013   Procedure: DILATATION & CURETTAGE/HYSTEROSCOPY WITH RESECTION OF POLYP;  Surgeon: Shanda SQUIBB Raeanne, MD;  Location: [facility];  Service: Gynecology;  Laterality: N/A;   MASTECTOMY PARTIAL / LUMPECTOMY W/ AXILLARY  LYMPHADENECTOMY  03/12/2010   Right w SLN Dr Merrilyn   SIMPLE MASTECTOMY  03/31/2011   For margin    Allergies and medications reviewed and updated.   Current Outpatient Medications:    aspirin  EC 81 MG tablet, Take 1 tablet (81 mg total) by mouth daily. Swallow whole., Disp: 90 tablet, Rfl: 3   atorvastatin  (LIPITOR) 40 MG tablet, Take 1 tablet (40 mg total) by mouth every morning., Disp: 90 tablet, Rfl: 1   carvedilol  (COREG ) 6.25 MG tablet, Take 1 tablet (6.25 mg total) by mouth 2 (two) times daily with a meal., Disp: 180 tablet, Rfl: 1   Cholecalciferol  (VITAMIN D3) 25 MCG (1000 UT) CAPS, Take 1 capsule (1,000 Units total) by mouth daily., Disp: 90 capsule, Rfl: 1   Continuous Glucose Sensor (DEXCOM G7 SENSOR) MISC, Use as directed, Disp: 1 each, Rfl: 2   COVID-19 mRNA bivalent vaccine, Pfizer, (PFIZER COVID-19 VAC BIVALENT) injection, Inject into the muscle., Disp: 0.3 mL, Rfl: 0   losartan  (COZAAR ) 25 MG tablet, Take 1 tablet (25 mg total) by mouth every morning., Disp: 30 tablet, Rfl: 3   metFORMIN  (GLUCOPHAGE ) 500 MG tablet, Take 1 tablet (500 mg total) by mouth daily with breakfast., Disp: 90 tablet, Rfl: 1   mirtazapine  (REMERON ) 7.5 MG tablet, Take 1 tablet (7.5 mg total) by mouth at bedtime., Disp: 30 tablet, Rfl: 3   COVID-19 mRNA vaccine 2023-2024 (COMIRNATY ) syringe, Inject into the muscle., Disp: 0.3  mL, Rfl: 0   glucose blood test strip, Use as instructed. Dispense based on patient and insurance preference. Use up to four times daily as directed. Dz:E11.9  Check BS TID prn (Patient taking differently: 1 each by Other route as needed for other (Use to check blood sugar 3 times a day). Use as instructed. Dispense based on patient and insurance preference. Use up to four times daily as dirUse to check blood sugar 3 times a dayected. Dz:E11.9  Check BS TID prn), Disp: 100 each, Rfl: 12   influenza vac split quadrivalent PF (FLUARIX  QUADRIVALENT) 0.5 ML injection, Inject into the  muscle., Disp: 0.5 mL, Rfl: 0   Semaglutide , 1 MG/DOSE, 4 MG/3ML SOPN, Inject 1 mg as directed once a week. (Patient not taking: Reported on 04/20/2024), Disp: 3 mL, Rfl: 0  No Known Allergies  Objective:   BP 119/79   Pulse 71   Temp 98.1 F (36.7 C)   Ht 5' 5 (1.651 m)   Wt 207 lb 1 oz (93.9 kg)   SpO2 96%   BMI 34.46 kg/m      04/20/2024   10:19 AM 07/15/2023   11:43 AM 07/15/2023   11:35 AM  Vitals with BMI  Height 5' 5 5' 5 5' 5  Weight 207 lbs 1 oz 220 lbs 1 oz   BMI 34.46 36.62   Systolic 119 126   Diastolic 79 84   Pulse 71 80      Physical Exam Vitals and nursing note reviewed.  Constitutional:      Appearance: Normal appearance. She is normal weight.  HENT:     Head: Normocephalic and atraumatic.  Cardiovascular:     Rate and Rhythm: Normal rate and regular rhythm.     Pulses: Normal pulses.     Heart sounds: Normal heart sounds.  Pulmonary:     Effort: Pulmonary effort is normal.     Breath sounds: Normal breath sounds.  Musculoskeletal:     Right lower leg: No edema.     Left lower leg: No edema.     Left ankle: Swelling present. No ecchymosis. No tenderness. Normal range of motion. Normal pulse.  Skin:    General: Skin is warm and dry.  Neurological:     General: No focal deficit present.     Mental Status: She is alert and oriented to person, place, and time. Mental status is at baseline.  Psychiatric:        Mood and Affect: Mood normal.        Behavior: Behavior normal.        Thought Content: Thought content normal.        Judgment: Judgment normal.     Assessment & Plan:  Left ankle swelling Assessment & Plan: Low suspicion for DVT as negative homan's and no added risk factors. Lungs are clear bilaterally and she has no SOB or orthopnea. My highest suspicion is MSK injury however will obtain CMP. Encouraged to rest, ice, elevate, use NSAIDs PRN, and avoid aggravating activities. Follow up if symptoms persist or worsen.   Orders: -      Comprehensive metabolic panel with GFR     Follow up plan: Return with PCP if symptoms worsen or fail to improve.  Jeoffrey GORMAN Barrio, FNP

## 2024-04-21 LAB — COMPREHENSIVE METABOLIC PANEL WITH GFR
AG Ratio: 1.7 (calc) (ref 1.0–2.5)
ALT: 50 U/L — ABNORMAL HIGH (ref 6–29)
AST: 29 U/L (ref 10–35)
Albumin: 4.5 g/dL (ref 3.6–5.1)
Alkaline phosphatase (APISO): 71 U/L (ref 37–153)
BUN: 14 mg/dL (ref 7–25)
CO2: 30 mmol/L (ref 20–32)
Calcium: 9.2 mg/dL (ref 8.6–10.4)
Chloride: 102 mmol/L (ref 98–110)
Creat: 0.68 mg/dL (ref 0.50–1.05)
Globulin: 2.6 g/dL (ref 1.9–3.7)
Glucose, Bld: 161 mg/dL — ABNORMAL HIGH (ref 65–99)
Potassium: 3.9 mmol/L (ref 3.5–5.3)
Sodium: 138 mmol/L (ref 135–146)
Total Bilirubin: 0.3 mg/dL (ref 0.2–1.2)
Total Protein: 7.1 g/dL (ref 6.1–8.1)
eGFR: 98 mL/min/1.73m2 (ref >=60)

## 2024-04-22 ENCOUNTER — Ambulatory Visit: Payer: Self-pay | Admitting: Family Medicine

## 2024-06-14 ENCOUNTER — Other Ambulatory Visit: Payer: Self-pay | Admitting: Family Medicine

## 2024-06-14 ENCOUNTER — Encounter (HOSPITAL_BASED_OUTPATIENT_CLINIC_OR_DEPARTMENT_OTHER): Payer: Self-pay

## 2024-06-14 ENCOUNTER — Other Ambulatory Visit (HOSPITAL_BASED_OUTPATIENT_CLINIC_OR_DEPARTMENT_OTHER): Payer: Self-pay

## 2024-06-14 DIAGNOSIS — F419 Anxiety disorder, unspecified: Secondary | ICD-10-CM

## 2024-06-15 ENCOUNTER — Other Ambulatory Visit (HOSPITAL_BASED_OUTPATIENT_CLINIC_OR_DEPARTMENT_OTHER): Payer: Self-pay

## 2024-06-15 ENCOUNTER — Other Ambulatory Visit: Payer: Self-pay | Admitting: Family Medicine

## 2024-06-15 DIAGNOSIS — F419 Anxiety disorder, unspecified: Secondary | ICD-10-CM

## 2024-06-15 MED ORDER — MIRTAZAPINE 7.5 MG PO TABS
7.5000 mg | ORAL_TABLET | Freq: Every day | ORAL | 0 refills | Status: AC
  Filled 2024-06-15: qty 90, 90d supply, fill #0

## 2024-06-15 MED ORDER — METFORMIN HCL 500 MG PO TABS
500.0000 mg | ORAL_TABLET | Freq: Every day | ORAL | 0 refills | Status: AC
Start: 2024-06-15 — End: ?
  Filled 2024-06-15: qty 90, 90d supply, fill #0

## 2024-06-15 MED ORDER — ATORVASTATIN CALCIUM 40 MG PO TABS
40.0000 mg | ORAL_TABLET | ORAL | 0 refills | Status: AC
  Filled 2024-06-15: qty 90, 90d supply, fill #0

## 2024-06-15 NOTE — Telephone Encounter (Signed)
 Copied from CRM [id number]. Topic: Clinical - Medication Refill >> Jun 15, 2024  2:11 PM Carla L wrote: Medication: mirtazapine  (REMERON ) 7.5 MG tablet metFORMIN  (GLUCOPHAGE ) 500 MG tablet atorvastatin  (LIPITOR) 40 MG tablet  Requesting refills until able to be seen. Patient scheduled follow up with [facility] in 09/29/23 in Iliana's absence.   Has the patient contacted their pharmacy? Yes  This is the patient's preferred pharmacy:  [facility] [address] [place] [address] Phone: [phone] Fax: [phone]  Is this the correct pharmacy for this prescription? Yes  Has the prescription been filled recently? No  Is the patient out of the medication? Yes  Has the patient been seen for an appointment in the last year OR does the patient have an upcoming appointment? Yes  Can we respond through MyChart? Yes  Agent: Please be advised that Rx refills may take up to 3 business days. We ask that you follow-up with your pharmacy.

## 2024-06-16 ENCOUNTER — Other Ambulatory Visit (HOSPITAL_BASED_OUTPATIENT_CLINIC_OR_DEPARTMENT_OTHER): Payer: Self-pay

## 2024-07-23 ENCOUNTER — Telehealth: Payer: Self-pay | Admitting: Pharmacy Technician

## 2024-07-23 NOTE — Telephone Encounter (Signed)
 Pharmacy Patient Advocate Encounter   Received notification from Onbase that prior authorization for Ozempic  (1 MG/DOSE) 4MG /3ML pen-injectors is due for renewal.   Insurance verification completed.   The patient is insured through [facility].  Action: Medication has been discontinued. Archived Key: [id number]  Patient's last fill of this medication was on 07/25/2023 and is reported as not taking.

## 2024-08-13 LAB — HM MAMMOGRAPHY

## 2024-08-16 ENCOUNTER — Encounter: Payer: Self-pay | Admitting: Family Medicine

## 2024-09-28 ENCOUNTER — Ambulatory Visit: Payer: Self-pay | Admitting: Family Medicine
